# Patient Record
Sex: Female | Born: 1965 | Race: Black or African American | Hispanic: No | Smoking: Former smoker
Health system: Southern US, Community
[De-identification: ages and names within clinical notes are randomized; demographics above are authoritative.]

## PROBLEM LIST (undated history)

## (undated) DIAGNOSIS — E079 Disorder of thyroid, unspecified: Secondary | ICD-10-CM

## (undated) DIAGNOSIS — C539 Malignant neoplasm of cervix uteri, unspecified: Secondary | ICD-10-CM

## (undated) DIAGNOSIS — N289 Disorder of kidney and ureter, unspecified: Secondary | ICD-10-CM

## (undated) DIAGNOSIS — I10 Essential (primary) hypertension: Secondary | ICD-10-CM

## (undated) DIAGNOSIS — C801 Malignant (primary) neoplasm, unspecified: Secondary | ICD-10-CM

## (undated) HISTORY — PX: STENT PLACEMENT RT URETER (ARMC HX): HXRAD1255

## (undated) HISTORY — PX: TUBAL LIGATION: SHX77

---

## 2017-02-19 ENCOUNTER — Encounter (HOSPITAL_COMMUNITY): Payer: Self-pay | Admitting: Emergency Medicine

## 2017-02-19 ENCOUNTER — Emergency Department (HOSPITAL_COMMUNITY): Payer: Medicaid Other

## 2017-02-19 ENCOUNTER — Inpatient Hospital Stay (HOSPITAL_COMMUNITY)
Admission: EM | Admit: 2017-02-19 | Discharge: 2017-02-22 | DRG: 744 | Disposition: A | Discharge status: Other Acute Inpatient Hospital | Payer: Medicaid Other | Attending: Obstetrics and Gynecology | Admitting: Obstetrics and Gynecology

## 2017-02-19 DIAGNOSIS — D649 Anemia, unspecified: Secondary | ICD-10-CM | POA: Diagnosis not present

## 2017-02-19 DIAGNOSIS — N390 Urinary tract infection, site not specified: Secondary | ICD-10-CM | POA: Diagnosis present

## 2017-02-19 DIAGNOSIS — E049 Nontoxic goiter, unspecified: Secondary | ICD-10-CM | POA: Diagnosis not present

## 2017-02-19 DIAGNOSIS — D62 Acute posthemorrhagic anemia: Secondary | ICD-10-CM | POA: Diagnosis not present

## 2017-02-19 DIAGNOSIS — N888 Other specified noninflammatory disorders of cervix uteri: Secondary | ICD-10-CM

## 2017-02-19 DIAGNOSIS — N939 Abnormal uterine and vaginal bleeding, unspecified: Secondary | ICD-10-CM

## 2017-02-19 DIAGNOSIS — C539 Malignant neoplasm of cervix uteri, unspecified: Secondary | ICD-10-CM | POA: Diagnosis not present

## 2017-02-19 DIAGNOSIS — N183 Chronic kidney disease, stage 3 unspecified: Secondary | ICD-10-CM | POA: Diagnosis present

## 2017-02-19 DIAGNOSIS — Z23 Encounter for immunization: Secondary | ICD-10-CM

## 2017-02-19 DIAGNOSIS — R591 Generalized enlarged lymph nodes: Secondary | ICD-10-CM

## 2017-02-19 DIAGNOSIS — N189 Chronic kidney disease, unspecified: Secondary | ICD-10-CM | POA: Diagnosis not present

## 2017-02-19 DIAGNOSIS — Z79899 Other long term (current) drug therapy: Secondary | ICD-10-CM

## 2017-02-19 DIAGNOSIS — N136 Pyonephrosis: Secondary | ICD-10-CM | POA: Diagnosis present

## 2017-02-19 DIAGNOSIS — N1330 Unspecified hydronephrosis: Secondary | ICD-10-CM

## 2017-02-19 DIAGNOSIS — K5900 Constipation, unspecified: Secondary | ICD-10-CM | POA: Diagnosis present

## 2017-02-19 DIAGNOSIS — D630 Anemia in neoplastic disease: Secondary | ICD-10-CM | POA: Diagnosis present

## 2017-02-19 DIAGNOSIS — R221 Localized swelling, mass and lump, neck: Secondary | ICD-10-CM

## 2017-02-19 DIAGNOSIS — I129 Hypertensive chronic kidney disease with stage 1 through stage 4 chronic kidney disease, or unspecified chronic kidney disease: Secondary | ICD-10-CM | POA: Diagnosis present

## 2017-02-19 DIAGNOSIS — Z960 Presence of urogenital implants: Secondary | ICD-10-CM | POA: Diagnosis present

## 2017-02-19 DIAGNOSIS — E042 Nontoxic multinodular goiter: Secondary | ICD-10-CM | POA: Diagnosis present

## 2017-02-19 DIAGNOSIS — N99820 Postprocedural hemorrhage and hematoma of a genitourinary system organ or structure following a genitourinary system procedure: Secondary | ICD-10-CM | POA: Diagnosis present

## 2017-02-19 DIAGNOSIS — I959 Hypotension, unspecified: Secondary | ICD-10-CM | POA: Diagnosis present

## 2017-02-19 DIAGNOSIS — Y848 Other medical procedures as the cause of abnormal reaction of the patient, or of later complication, without mention of misadventure at the time of the procedure: Secondary | ICD-10-CM | POA: Diagnosis present

## 2017-02-19 DIAGNOSIS — E0789 Other specified disorders of thyroid: Secondary | ICD-10-CM | POA: Diagnosis present

## 2017-02-19 DIAGNOSIS — F17210 Nicotine dependence, cigarettes, uncomplicated: Secondary | ICD-10-CM | POA: Diagnosis present

## 2017-02-19 HISTORY — DX: Disorder of thyroid, unspecified: E07.9

## 2017-02-19 HISTORY — DX: Disorder of kidney and ureter, unspecified: N28.9

## 2017-02-19 HISTORY — DX: Essential (primary) hypertension: I10

## 2017-02-19 LAB — COMPREHENSIVE METABOLIC PANEL
ALBUMIN: 3.5 g/dL (ref 3.5–5.0)
ALK PHOS: 66 U/L (ref 38–126)
ALT: 8 U/L — AB (ref 14–54)
AST: 17 U/L (ref 15–41)
Anion gap: 8 (ref 5–15)
BUN: 15 mg/dL (ref 6–20)
CALCIUM: 9.5 mg/dL (ref 8.9–10.3)
CHLORIDE: 102 mmol/L (ref 101–111)
CO2: 25 mmol/L (ref 22–32)
CREATININE: 1.8 mg/dL — AB (ref 0.44–1.00)
GFR calc non Af Amer: 31 mL/min — ABNORMAL LOW (ref >60)
GFR, EST AFRICAN AMERICAN: 36 mL/min — AB (ref >60)
GLUCOSE: 94 mg/dL (ref 65–99)
Potassium: 4.3 mmol/L (ref 3.5–5.1)
SODIUM: 135 mmol/L (ref 135–145)
Total Bilirubin: 0.1 mg/dL — ABNORMAL LOW (ref 0.3–1.2)
Total Protein: 7.4 g/dL (ref 6.5–8.1)

## 2017-02-19 LAB — CBC WITH DIFFERENTIAL/PLATELET
Basophils Absolute: 0 10*3/uL (ref 0.0–0.1)
Basophils Relative: 0 %
EOS ABS: 0.4 10*3/uL (ref 0.0–0.7)
EOS PCT: 6 %
HCT: 27.3 % — ABNORMAL LOW (ref 36.0–46.0)
Hemoglobin: 8.6 g/dL — ABNORMAL LOW (ref 12.0–15.0)
LYMPHS PCT: 26 %
Lymphs Abs: 1.9 10*3/uL (ref 0.7–4.0)
MCH: 28.5 pg (ref 26.0–34.0)
MCHC: 31.5 g/dL (ref 30.0–36.0)
MCV: 90.4 fL (ref 78.0–100.0)
MONOS PCT: 8 %
Monocytes Absolute: 0.6 10*3/uL (ref 0.1–1.0)
Neutro Abs: 4.4 10*3/uL (ref 1.7–7.7)
Neutrophils Relative %: 60 %
PLATELETS: 433 10*3/uL — AB (ref 150–400)
RBC: 3.02 MIL/uL — ABNORMAL LOW (ref 3.87–5.11)
RDW: 14.4 % (ref 11.5–15.5)
WBC: 7.3 10*3/uL (ref 4.0–10.5)

## 2017-02-19 LAB — URINALYSIS, ROUTINE W REFLEX MICROSCOPIC
BILIRUBIN URINE: NEGATIVE
Glucose, UA: NEGATIVE mg/dL
KETONES UR: NEGATIVE mg/dL
Nitrite: NEGATIVE
Protein, ur: 30 mg/dL — AB
SPECIFIC GRAVITY, URINE: 1.01 (ref 1.005–1.030)
pH: 7 (ref 5.0–8.0)

## 2017-02-19 LAB — POC URINE PREG, ED: PREG TEST UR: NEGATIVE

## 2017-02-19 LAB — LIPASE, BLOOD: Lipase: 49 U/L (ref 11–51)

## 2017-02-19 MED ORDER — MORPHINE SULFATE (PF) 4 MG/ML IV SOLN
4.0000 mg | Freq: Once | INTRAVENOUS | Status: AC
  Administered 2017-02-19: 4 mg via INTRAVENOUS
  Filled 2017-02-19: qty 1

## 2017-02-19 MED ORDER — METOCLOPRAMIDE HCL 5 MG/ML IJ SOLN: 10.0000 mg | Freq: Four times a day (QID) | INTRAMUSCULAR | Status: DC | PRN

## 2017-02-19 MED ORDER — PRENATAL MULTIVITAMIN CH: 1.0000 | ORAL_TABLET | Freq: Every day | ORAL | Status: DC

## 2017-02-19 MED ORDER — SODIUM CHLORIDE 0.9 % IV SOLN
INTRAVENOUS | Status: DC
  Administered 2017-02-19 – 2017-02-20 (×4): via INTRAVENOUS

## 2017-02-19 MED ORDER — INFLUENZA VAC SPLIT QUAD 0.5 ML IM SUSY
0.5000 mL | PREFILLED_SYRINGE | INTRAMUSCULAR | Status: AC
  Administered 2017-02-20: 0.5 mL via INTRAMUSCULAR
  Filled 2017-02-19: qty 0.5

## 2017-02-19 MED ORDER — ENSURE ENLIVE PO LIQD
237.0000 mL | Freq: Two times a day (BID) | ORAL | Status: DC
  Administered 2017-02-20 (×2): 237 mL via ORAL

## 2017-02-19 MED ORDER — DOCUSATE SODIUM 100 MG PO CAPS
100.0000 mg | ORAL_CAPSULE | Freq: Two times a day (BID) | ORAL | Status: DC
  Administered 2017-02-19 – 2017-02-22 (×6): 100 mg via ORAL
  Filled 2017-02-19 (×6): qty 1

## 2017-02-19 MED ORDER — CARVEDILOL 12.5 MG PO TABS
12.5000 mg | ORAL_TABLET | Freq: Two times a day (BID) | ORAL | Status: DC
  Administered 2017-02-20: 12.5 mg via ORAL
  Filled 2017-02-19: qty 1

## 2017-02-19 MED ORDER — IBUPROFEN 600 MG PO TABS: 600.0000 mg | ORAL_TABLET | Freq: Four times a day (QID) | ORAL | Status: DC | PRN

## 2017-02-19 MED ORDER — PNEUMOCOCCAL VAC POLYVALENT 25 MCG/0.5ML IJ INJ
0.5000 mL | INJECTION | INTRAMUSCULAR | Status: AC
  Administered 2017-02-20: 0.5 mL via INTRAMUSCULAR
  Filled 2017-02-19: qty 0.5

## 2017-02-19 MED ORDER — AMLODIPINE BESYLATE 5 MG PO TABS
10.0000 mg | ORAL_TABLET | Freq: Every day | ORAL | Status: DC
  Filled 2017-02-19 (×2): qty 2

## 2017-02-19 MED ORDER — SILVER NITRATE-POT NITRATE 75-25 % EX MISC
CUTANEOUS | Status: AC
  Filled 2017-02-19: qty 1

## 2017-02-19 MED ORDER — CEPHALEXIN 500 MG PO CAPS
500.0000 mg | ORAL_CAPSULE | Freq: Once | ORAL | Status: AC
  Administered 2017-02-19: 500 mg via ORAL
  Filled 2017-02-19: qty 1

## 2017-02-19 MED ORDER — HYDROCODONE-ACETAMINOPHEN 5-325 MG PO TABS
1.0000 | ORAL_TABLET | ORAL | Status: DC | PRN
  Administered 2017-02-19 – 2017-02-22 (×12): 2 via ORAL
  Filled 2017-02-19 (×12): qty 2

## 2017-02-19 MED ORDER — FERROUS SULFATE 325 (65 FE) MG PO TABS
975.0000 mg | ORAL_TABLET | Freq: Three times a day (TID) | ORAL | Status: DC
  Administered 2017-02-19 – 2017-02-22 (×9): 975 mg via ORAL
  Filled 2017-02-19 (×9): qty 3

## 2017-02-19 NOTE — ED Notes (Signed)
Consent signed.

## 2017-02-19 NOTE — ED Notes (Signed)
Dr. Glo Herring at bedside for cervical biopsy.

## 2017-02-19 NOTE — H&P (Signed)
Tanya Wright is an 51 y.o. female. She is placed in observation, to monitor for vaginal bleeding after cervical biopsies for a probable advanced cervical cancer. She has been bleeding for at least 'a few months" probably longer. She has had ureteral stents placed several months ago , august, at [facility], by Urology but reports that she was not seen by a gyn, and has not been able to arrange an appt locally. She is from [place], and lives alone, near her mother.  Pertinent Gynecological History: Menses: post-menopausal Bleeding: post menopausal bleeding Contraception: none DES exposure: unknown Blood transfusions: none Sexually transmitted diseases: no past history Previous GYN Procedures:   Last mammogram: normal Date: unknown pt reports getting regular exams. Last pap:  Date:  OB History: G, P   Menstrual History: Menarche age:  No LMP recorded. Patient is not currently having periods (Reason: Other).    Past Medical History:  Diagnosis Date  . Hypertension   . Renal disorder   . Thyroid disease     Past Surgical History:  Procedure Laterality Date  . STENT PLACEMENT RT URETER ([place] HX)     sent in both kidneys for stones  . TUBAL LIGATION      History reviewed. No pertinent family history.  Social History:  reports that she has been smoking Cigarettes.  She has never used smokeless tobacco. She reports that she drinks alcohol. She reports that she uses drugs, including Marijuana.  Allergies: No Known Allergies   (Not in a hospital admission)  ROS has seen providers at [facility] for Paps, htn, and gone to [place] where stents were placed.  Blood pressure 113/85, pulse 91, temperature 98.5 F (36.9 C), temperature source Oral, resp. rate 17, height 5\' 11"  (1.803 m), weight 145 lb (65.8 kg), SpO2 100 %. Physical Exam  Constitutional: She is oriented to person, place, and time. She appears well-developed and well-nourished.  Well  groomed, tearful afraid  HENT:  Head: Normocephalic.  Neck: No tracheal deviation present. Thyromegaly present.  Huge asssymmetric thyroid mass, mostly on left , 8-10 cm diameter, displaces left carotid.laterally and posteriorly Larynx intace    GI: Soft.  Genitourinary: Vagina normal.  Genitourinary Comments: Huge cervical mass originating fro internal cervix. Biopsied x 3. Bimanual : pelvis fixed with nodularity in adnexa  Musculoskeletal: She exhibits edema and deformity.  Neurological: She is alert and oriented to person, place, and time. She has normal reflexes.    Results for orders placed or performed during the hospital encounter of 02/19/17 (from the past 24 hour(s))  Urinalysis, Routine w reflex microscopic     Status: Abnormal   Collection Time: 02/19/17 10:01 AM  Result Value Ref Range   Color, Urine STRAW (A) YELLOW   APPearance HAZY (A) CLEAR   Specific Gravity, Urine 1.010 1.005 - 1.030   pH 7.0 5.0 - 8.0   Glucose, UA NEGATIVE NEGATIVE mg/dL   Hgb urine dipstick MODERATE (A) NEGATIVE   Bilirubin Urine NEGATIVE NEGATIVE   Ketones, ur NEGATIVE NEGATIVE mg/dL   Protein, ur 30 (A) NEGATIVE mg/dL   Nitrite NEGATIVE NEGATIVE   Leukocytes, UA LARGE (A) NEGATIVE   RBC / HPF 6-30 0 - 5 RBC/hpf   WBC, UA TOO NUMEROUS TO COUNT 0 - 5 WBC/hpf   Bacteria, UA RARE (A) NONE SEEN   Squamous Epithelial / LPF 6-30 (A) NONE SEEN   Mucus PRESENT    Hyaline Casts, UA PRESENT   POC urine preg, ED  Status: None   Collection Time: 02/19/17 10:13 AM  Result Value Ref Range   Preg Test, Ur NEGATIVE NEGATIVE  Comprehensive metabolic panel     Status: Abnormal   Collection Time: 02/19/17 10:27 AM  Result Value Ref Range   Sodium 135 135 - 145 mmol/L   Potassium 4.3 3.5 - 5.1 mmol/L   Chloride 102 101 - 111 mmol/L   CO2 25 22 - 32 mmol/L   Glucose, Bld 94 65 - 99 mg/dL   BUN 15 6 - 20 mg/dL   Creatinine, Ser 1.80 (H) 0.44 - 1.00 mg/dL   Calcium 9.5 8.9 - 10.3 mg/dL   Total  Protein 7.4 6.5 - 8.1 g/dL   Albumin 3.5 3.5 - 5.0 g/dL   AST 17 15 - 41 U/L   ALT 8 (L) 14 - 54 U/L   Alkaline Phosphatase 66 38 - 126 U/L   Total Bilirubin 0.1 (L) 0.3 - 1.2 mg/dL   GFR calc non Af Amer 31 (L) >60 mL/min   GFR calc Af Amer 36 (L) >60 mL/min   Anion gap 8 5 - 15  Lipase, blood     Status: None   Collection Time: 02/19/17 10:27 AM  Result Value Ref Range   Lipase 49 11 - 51 U/L  CBC with Diff     Status: Abnormal   Collection Time: 02/19/17 10:27 AM  Result Value Ref Range   WBC 7.3 4.0 - 10.5 K/uL   RBC 3.02 (L) 3.87 - 5.11 MIL/uL   Hemoglobin 8.6 (L) 12.0 - 15.0 g/dL   HCT 27.3 (L) 36.0 - 46.0 %   MCV 90.4 78.0 - 100.0 fL   MCH 28.5 26.0 - 34.0 pg   MCHC 31.5 30.0 - 36.0 g/dL   RDW 14.4 11.5 - 15.5 %   Platelets 433 (H) 150 - 400 K/uL   Neutrophils Relative % 60 %   Neutro Abs 4.4 1.7 - 7.7 K/uL   Lymphocytes Relative 26 %   Lymphs Abs 1.9 0.7 - 4.0 K/uL   Monocytes Relative 8 %   Monocytes Absolute 0.6 0.1 - 1.0 K/uL   Eosinophils Relative 6 %   Eosinophils Absolute 0.4 0.0 - 0.7 K/uL   Basophils Relative 0 %   Basophils Absolute 0.0 0.0 - 0.1 K/uL    Ct Renal Stone Study  Result Date: 02/19/2017 CLINICAL DATA:  Bilateral flank pain, nausea/vomiting, ureteral stents, history of kidney stones EXAM: CT ABDOMEN AND PELVIS WITHOUT CONTRAST TECHNIQUE: Multidetector CT imaging of the abdomen and pelvis was performed following the standard protocol without IV contrast. COMPARISON:  None. FINDINGS: Lower chest: Lung bases are clear. Hepatobiliary: Unenhanced liver is unremarkable. Gallbladder is unremarkable. No intrahepatic for extrahepatic duct dilatation. Pancreas: Within normal limits. Spleen: Within normal limits. Adrenals/Urinary Tract: Adrenal glands within normal limits. Kidneys are notable for moderate hydroureteronephrosis with indwelling bilateral ureteral stents. No renal, ureteral, or bladder calculi. 2.6 cm posterior urethral diverticulum (series  2/image 78). Bladder is within normal limits. Stomach/Bowel: Stomach is within normal limits. No evidence of bowel obstruction. Normal appendix (series 2/ image 64). Vascular/Lymphatic: No evidence of abdominal aortic aneurysm. Atherosclerotic calcifications of the abdominal aorta and branch vessels. Extensive abdominopelvic lymphadenopathy, much of which is partially calcified, including: --1.8 cm short axis peripancreatic node (series 2/ image 28) --2.8 cm short axis left para- aortic node (series 2/ image 39) --2.6 cm short axis right common iliac node (series 2/ image 46) --1.9 cm short axis node anterior to the  L5 vertebral body (series 2/ image 52) --1.8 cm short axis right external iliac node (series 2/image 66) --1.9 cm short axis right external iliac node (series 2/image 67) --1.5 cm short axis left deep inguinal node (series 2/image 67) --1.3 cm short axis left perirectal node (series 2/image 75) Reproductive: 4.5 x 6.1 x 5.0 cm mass centered in the region of the cervix (series 2/image 69; sagittal image 55). Parametrial invasion is suspected, although poorly evaluated on CT. Associated obstructive uropathy, described above. Direct extension into the anterior rectal wall is difficult to exclude (series 2/image 70). Associated fluid trapped within the endometrial cavity (sagittal image 53). Other: No abdominopelvic ascites. Musculoskeletal: Degenerative changes of the visualized thoracolumbar spine. IMPRESSION: 6.1 cm cervical mass, poorly evaluated on unenhanced CT, suspicious for cervical cancer. Associated moderate bilateral hydroureteronephrosis with indwelling bilateral ureteral stents. No renal or ureteral calculi. Direct invasion into the anterior rectum is not excluded. If present, this would make the FIGO staging at least stage IVA. Extensive abdominopelvic lymphadenopathy, many of which are partially calcified. In this clinical setting, nodal metastases are presumed. The presence of suspected  nodal metastases outside the true pelvis would reflect stage IVB disease. Electronically Signed   By: Julian Hy M.D.   On: 02/19/2017 13:05    Assessment/Plan:  1. Cervical mass, biopsied suspect cervical cancer 2. Ureteral obstruction , releieved by ureteral stents. 3  Neck mass                                                                                                                                              4 anemia.  Plan: admit for overnight obs and toget pt to [facility] once dx clarified. Azelia Reiger V 02/19/2017, 3:36 PM

## 2017-02-19 NOTE — ED Provider Notes (Signed)
[facility] EMERGENCY DEPARTMENT Provider Note   CSN: [id number] Arrival date & time: 02/19/17  0626     History   Chief Complaint Chief Complaint  Patient presents with  . Flank Pain    HPI Tanya Wright is a 51 y.o. female.  Pt has history of renal failure, and renal stents.  She state she was admitted to the hospital at [facility].  She was eventually released.  Pt has been seeing different doctors since that time. She has been having pain in the left flank and suprapubic region for several weeks. She was told to follow up with an OB GYN doctor.  She has not done that yet because of insurance reasons.  She is still having pain.  Her renal doctor has been trying to get her in with a GYN doctor but is having issues because of her lack of insurance.  Pt is frustrated and tearful.  She is afraid people are going to let her die because she does not have insurance.     The history is provided by the patient.  Flank Pain  This is a recurrent problem. The problem has been rapidly worsening. Associated symptoms include abdominal pain. Nothing aggravates the symptoms. Nothing relieves the symptoms.    Past Medical History:  Diagnosis Date  . Hypertension   . Renal disorder   . Thyroid disease     There are no active problems to display for this patient.   Past Surgical History:  Procedure Laterality Date  . STENT PLACEMENT RT URETER ([place] HX)     sent in both kidneys for stones  . TUBAL LIGATION      OB History    No data available       Home Medications    Prior to Admission medications   Medication Sig Start Date End Date Taking? Authorizing Provider  acetaminophen (TYLENOL) 500 MG tablet Take 1,000 mg by mouth every 6 (six) hours as needed for mild pain, moderate pain or headache.   Yes [provider]  amLODipine (NORVASC) 10 MG tablet Take 10 mg by mouth at bedtime.   Yes [provider]  calcium acetate (PHOSLO) 667 MG capsule Take 667 mg  by mouth 3 (three) times daily with meals.   Yes [provider]  carvedilol (COREG) 12.5 MG tablet Take 12.5 mg by mouth 2 (two) times daily.   Yes [provider]  ferrous sulfate 325 (65 FE) MG tablet Take 975 mg by mouth 3 (three) times daily.   Yes [provider]    Family History History reviewed. No pertinent family history.  Social History Social History  Substance Use Topics  . Smoking status: Current Some Day Smoker    Types: Cigarettes  . Smokeless tobacco: Never Used  . Alcohol use Yes     Allergies   Patient has no known allergies.   Review of Systems Review of Systems  Gastrointestinal: Positive for abdominal pain.  Genitourinary: Positive for flank pain.  All other systems reviewed and are negative.    Physical Exam Updated Vital Signs BP 111/82 (BP Location: Left Arm)   Pulse 90   Temp 98.5 F (36.9 C) (Oral)   Resp 19   Ht 1.803 m (5\' 11" )   Wt 65.8 kg (145 lb)   SpO2 99%   BMI 20.22 kg/m   Physical Exam  Constitutional: She appears well-developed and well-nourished. No distress.  HENT:  Head: Normocephalic and atraumatic.  Right Ear: External ear  normal.  Left Ear: External ear normal.  Eyes: Conjunctivae are normal. Right eye exhibits no discharge. Left eye exhibits no discharge. No scleral icterus.  Neck: Neck supple. No tracheal deviation present. Thyromegaly present.  assymetric left side  Cardiovascular: Normal rate, regular rhythm and intact distal pulses.   Pulmonary/Chest: Effort normal and breath sounds normal. No stridor. No respiratory distress. She has no wheezes. She has no rales.  Abdominal: Soft. Bowel sounds are normal. She exhibits no distension. There is no tenderness. There is CVA tenderness (left). There is no rebound and no guarding.  Musculoskeletal: She exhibits no edema or tenderness.  Neurological: She is alert. She has normal strength. No cranial nerve deficit (no facial droop, extraocular  movements intact, no slurred speech) or sensory deficit. She exhibits normal muscle tone. She displays no seizure activity. Coordination normal.  Skin: Skin is warm and dry. No rash noted.  Psychiatric: She has a normal mood and affect.  Nursing note and vitals reviewed.    ED Treatments / Results  Labs (all labs ordered are listed, but only abnormal results are displayed) Labs Reviewed  COMPREHENSIVE METABOLIC PANEL - Abnormal; Notable for the following:       Result Value   Creatinine, Ser 1.80 (*)    ALT 8 (*)    Total Bilirubin 0.1 (*)    GFR calc non Af Amer 31 (*)    GFR calc Af Amer 36 (*)    All other components within normal limits  CBC WITH DIFFERENTIAL/PLATELET - Abnormal; Notable for the following:    RBC 3.02 (*)    Hemoglobin 8.6 (*)    HCT 27.3 (*)    Platelets 433 (*)    All other components within normal limits  URINALYSIS, ROUTINE W REFLEX MICROSCOPIC - Abnormal; Notable for the following:    Color, Urine STRAW (*)    APPearance HAZY (*)    Hgb urine dipstick MODERATE (*)    Protein, ur 30 (*)    Leukocytes, UA LARGE (*)    Bacteria, UA RARE (*)    Squamous Epithelial / LPF 6-30 (*)    All other components within normal limits  LIPASE, BLOOD  POC URINE PREG, ED    EKG  EKG Interpretation None       Radiology Ct Renal Stone Study  Result Date: 02/19/2017 CLINICAL DATA:  Bilateral flank pain, nausea/vomiting, ureteral stents, history of kidney stones EXAM: CT ABDOMEN AND PELVIS WITHOUT CONTRAST TECHNIQUE: Multidetector CT imaging of the abdomen and pelvis was performed following the standard protocol without IV contrast. COMPARISON:  None. FINDINGS: Lower chest: Lung bases are clear. Hepatobiliary: Unenhanced liver is unremarkable. Gallbladder is unremarkable. No intrahepatic for extrahepatic duct dilatation. Pancreas: Within normal limits. Spleen: Within normal limits. Adrenals/Urinary Tract: Adrenal glands within normal limits. Kidneys are notable  for moderate hydroureteronephrosis with indwelling bilateral ureteral stents. No renal, ureteral, or bladder calculi. 2.6 cm posterior urethral diverticulum (series 2/image 78). Bladder is within normal limits. Stomach/Bowel: Stomach is within normal limits. No evidence of bowel obstruction. Normal appendix (series 2/ image 64). Vascular/Lymphatic: No evidence of abdominal aortic aneurysm. Atherosclerotic calcifications of the abdominal aorta and branch vessels. Extensive abdominopelvic lymphadenopathy, much of which is partially calcified, including: --1.8 cm short axis peripancreatic node (series 2/ image 28) --2.8 cm short axis left para- aortic node (series 2/ image 39) --2.6 cm short axis right common iliac node (series 2/ image 46) --1.9 cm short axis node anterior to the L5 vertebral body (series 2/  image 52) --1.8 cm short axis right external iliac node (series 2/image 66) --1.9 cm short axis right external iliac node (series 2/image 67) --1.5 cm short axis left deep inguinal node (series 2/image 67) --1.3 cm short axis left perirectal node (series 2/image 75) Reproductive: 4.5 x 6.1 x 5.0 cm mass centered in the region of the cervix (series 2/image 69; sagittal image 55). Parametrial invasion is suspected, although poorly evaluated on CT. Associated obstructive uropathy, described above. Direct extension into the anterior rectal wall is difficult to exclude (series 2/image 70). Associated fluid trapped within the endometrial cavity (sagittal image 53). Other: No abdominopelvic ascites. Musculoskeletal: Degenerative changes of the visualized thoracolumbar spine. IMPRESSION: 6.1 cm cervical mass, poorly evaluated on unenhanced CT, suspicious for cervical cancer. Associated moderate bilateral hydroureteronephrosis with indwelling bilateral ureteral stents. No renal or ureteral calculi. Direct invasion into the anterior rectum is not excluded. If present, this would make the FIGO staging at least stage IVA.  Extensive abdominopelvic lymphadenopathy, many of which are partially calcified. In this clinical setting, nodal metastases are presumed. The presence of suspected nodal metastases outside the true pelvis would reflect stage IVB disease. Electronically Signed   By: Julian Hy M.D.   On: 02/19/2017 13:05    Procedures Procedures (including critical care time)  Medications Ordered in ED Medications  0.9 %  sodium chloride infusion ( Intravenous New Bag/Given 02/19/17 1025)  morphine 4 MG/ML injection 4 mg (4 mg Intravenous Given 02/19/17 1026)  cephALEXin (KEFLEX) capsule 500 mg (500 mg Oral Given 02/19/17 1108)     Initial Impression / Assessment and Plan / ED Course  I have reviewed the triage vital signs and the nursing notes.  Pertinent labs & imaging results that were available during my care of the patient were reviewed by me and considered in my medical decision making (see chart for details).  Clinical Course as of Feb 19 1450  Mon Feb 19, 2017  0940 30 hydrocodone on 10/4  [JK]  1057 Anemia noted.  No prior labs for comparison.  Doubt acute loss .  No complains of blood in her stool.  UA suggests UTI.  Wih the flank pain will ct to rule out renal stone  [JK]  1414 Discussed CT findings with patient.  Discussed case with Dr Glo Herring.  He will come  and see patient in the ED.  Appreciate his assistance  [JK]    Clinical Course User Index [JK] Dorie Rank, MD   Patient's laboratory tests are notable for a probable urinary tract infection.  Patient does have a mild anemia.  Patient states she has been anemic for a while.  I suspect this is chronic.  She does have a slightly elevated creatinine but no signs of acute renal failure.  CT scan does demonstrate a mass consistent with a cervical cancer.  She also does have abnormal lymph nodes.  Patient states she has not seen a gynecologist yet.  I will consult with OB/GYN.  Dr Glo Herring saw the patient in the ED.  He did a biopsy and  will plan on admitting for observation as she is likely to have some bleeding after the procedure  Final Clinical Impressions(s) / ED Diagnoses   Final diagnoses:  Goiter  Cervical mass  Chronic renal impairment, unspecified CKD stage  Hydronephrosis, unspecified hydronephrosis type      Dorie Rank, MD 02/19/17 1559

## 2017-02-19 NOTE — ED Triage Notes (Signed)
Pt states has history of kidney stones. Has stents on both sides. N/v with 3 episodes in 24 hours. Complaining of bilateral pain in flank area.

## 2017-02-20 ENCOUNTER — Observation Stay (HOSPITAL_COMMUNITY): Payer: Medicaid Other

## 2017-02-20 DIAGNOSIS — N939 Abnormal uterine and vaginal bleeding, unspecified: Secondary | ICD-10-CM | POA: Diagnosis not present

## 2017-02-20 DIAGNOSIS — N189 Chronic kidney disease, unspecified: Secondary | ICD-10-CM | POA: Diagnosis present

## 2017-02-20 DIAGNOSIS — C539 Malignant neoplasm of cervix uteri, unspecified: Secondary | ICD-10-CM | POA: Diagnosis not present

## 2017-02-20 DIAGNOSIS — N183 Chronic kidney disease, stage 3 unspecified: Secondary | ICD-10-CM | POA: Diagnosis present

## 2017-02-20 DIAGNOSIS — N390 Urinary tract infection, site not specified: Secondary | ICD-10-CM | POA: Diagnosis present

## 2017-02-20 DIAGNOSIS — E049 Nontoxic goiter, unspecified: Secondary | ICD-10-CM | POA: Diagnosis not present

## 2017-02-20 DIAGNOSIS — D649 Anemia, unspecified: Secondary | ICD-10-CM | POA: Diagnosis not present

## 2017-02-20 LAB — BASIC METABOLIC PANEL
Anion gap: 7 (ref 5–15)
BUN: 17 mg/dL (ref 6–20)
CHLORIDE: 107 mmol/L (ref 101–111)
CO2: 23 mmol/L (ref 22–32)
Calcium: 8.8 mg/dL — ABNORMAL LOW (ref 8.9–10.3)
Creatinine, Ser: 2.02 mg/dL — ABNORMAL HIGH (ref 0.44–1.00)
GFR calc Af Amer: 32 mL/min — ABNORMAL LOW (ref >60)
GFR calc non Af Amer: 27 mL/min — ABNORMAL LOW (ref >60)
GLUCOSE: 131 mg/dL — AB (ref 65–99)
POTASSIUM: 3.9 mmol/L (ref 3.5–5.1)
Sodium: 137 mmol/L (ref 135–145)

## 2017-02-20 LAB — CBC WITH DIFFERENTIAL/PLATELET
Basophils Absolute: 0 10*3/uL (ref 0.0–0.1)
Basophils Relative: 0 %
Eosinophils Absolute: 0.4 10*3/uL (ref 0.0–0.7)
Eosinophils Relative: 7 %
HEMATOCRIT: 23.4 % — AB (ref 36.0–46.0)
HEMOGLOBIN: 7.6 g/dL — AB (ref 12.0–15.0)
LYMPHS ABS: 1.9 10*3/uL (ref 0.7–4.0)
Lymphocytes Relative: 31 %
MCH: 28.5 pg (ref 26.0–34.0)
MCHC: 32.5 g/dL (ref 30.0–36.0)
MCV: 87.6 fL (ref 78.0–100.0)
MONOS PCT: 6 %
Monocytes Absolute: 0.4 10*3/uL (ref 0.1–1.0)
NEUTROS ABS: 3.5 10*3/uL (ref 1.7–7.7)
NEUTROS PCT: 56 %
Platelets: 384 10*3/uL (ref 150–400)
RBC: 2.67 MIL/uL — ABNORMAL LOW (ref 3.87–5.11)
RDW: 14.4 % (ref 11.5–15.5)
WBC: 6.3 10*3/uL (ref 4.0–10.5)

## 2017-02-20 LAB — PREPARE RBC (CROSSMATCH)

## 2017-02-20 LAB — TSH: TSH: 0.524 u[IU]/mL (ref 0.350–4.500)

## 2017-02-20 MED ORDER — SODIUM CHLORIDE 0.9 % IV BOLUS (SEPSIS)
500.0000 mL | Freq: Once | INTRAVENOUS | Status: AC
  Administered 2017-02-20: 500 mL via INTRAVENOUS

## 2017-02-20 MED ORDER — FLEET ENEMA 7-19 GM/118ML RE ENEM
1.0000 | ENEMA | Freq: Every day | RECTAL | Status: DC | PRN
  Administered 2017-02-21: 1 via RECTAL
  Filled 2017-02-20: qty 1

## 2017-02-20 MED ORDER — ENSURE ENLIVE PO LIQD
237.0000 mL | Freq: Three times a day (TID) | ORAL | Status: DC
  Administered 2017-02-20 – 2017-02-22 (×4): 237 mL via ORAL

## 2017-02-20 MED ORDER — SODIUM CHLORIDE 0.9 % IV SOLN: Freq: Once | INTRAVENOUS | Status: DC

## 2017-02-20 MED ORDER — ACETAMINOPHEN 325 MG PO TABS: 650.0000 mg | ORAL_TABLET | Freq: Four times a day (QID) | ORAL | Status: DC | PRN

## 2017-02-20 MED ORDER — POLYETHYLENE GLYCOL 3350 17 G PO PACK
17.0000 g | PACK | Freq: Every day | ORAL | Status: DC
  Administered 2017-02-20 – 2017-02-22 (×3): 17 g via ORAL
  Filled 2017-02-20 (×3): qty 1

## 2017-02-20 MED ORDER — DEXTROSE 5 % IV SOLN
1.0000 g | INTRAVENOUS | Status: DC
  Administered 2017-02-20 – 2017-02-21 (×2): 1 g via INTRAVENOUS
  Filled 2017-02-20 (×4): qty 10

## 2017-02-20 NOTE — Progress Notes (Addendum)
Initial Nutrition Assessment  DOCUMENTATION CODES:  Not applicable  INTERVENTION:  Increase Ensure Enlive po to TID, each supplement provides 350 kcal and 20 grams of protein  Meal/food requests noted, passed to dietary  Gave brief diet education for high protein, kcal intake.   Patient reports no BM x 5 days. Noted on iron, prn opiate medications. Pt felt she needed assistance with more aggressive bowel regimen->conferred to MD  NUTRITION DIAGNOSIS:  Increased nutrient needs related to cancer associated hypermetabolism as evidenced by estimated nutritional requirements for this condition and patients report of loss of ~25 lbs in a "few months"  GOAL:  Patient will meet greater than or equal to 90% of their needs  MONITOR:  PO intake, Supplement acceptance, Labs, Weight trends, I & O's  REASON FOR ASSESSMENT:  Malnutrition Screening Tool    ASSESSMENT:  51 y/o female PMHx HTN, Kidney stones s/p bilateral ureteral stents, thyroid disease. Presented with 3 episodes of vomiting x 24 hrs, bilateral flank pain, and report of vaginal bleeding for months. Imaging revealed mass consistent w/ cervical cancer as well as extensive abdominopelvic lymphadenopathy. Admitted for observation for potential bleeding s/p biopsy of mass.   Patient seen lying in bed with lights off, slightly tearful. She reports she has had a decrease in Intake for a "few months". She says this is due to a much heightened increase in smell sensitivity. She becomes nauseated when she smells certain food. Additionally, reports early satiety, constipation and pain. Despite this, she maintains her appetite has been pretty good. She will try to eat small amounts frequently. She has been consuming Ensure, approximately 3-4/day. Vitamin/mineral wise, she takes an iron supplement due to her anemia.   There is no past weight history on patient. She endorses weight loss. She says her UBW is approximately 170 lbs and she has lost  all this weight in the past few months. She does state that she has "leveled off", indicating her caloric intake is currently meeting her expenditure.   At this time, she says she has not had a BM in ~5 days. She says drinks fluids liberally at home, mostly water. Milk gives her diarrhea. She says she feels like she will need a softener/laxative. Will communicate this to MD  RD gave some brief diet education. Assuming she pursues treatment, she will have much increased caloric/protein needs no matter what treatment is chosen. RD discussed importance of protein/calories in cancer treatment. Recommended she switch from 0kcal beverages to items such as her ensure, low sugar sportsdrinks, juice etc. She should prioritize protein. RD offered comfort foods for tonight.   NFPE: Deferred. Not appropriate at this time.   LaBS: BUN/creat:2.02/8.8 (increased), H/H:7.6/23.4 (decrease) Meds: Ensure Enlive, Iron, colace, prn pain medications   Recent Labs Lab 02/19/17 1027 02/20/17 1240  NA 135 137  K 4.3 3.9  CL 102 107  CO2 25 23  BUN 15 17  CREATININE 1.80* 2.02*  CALCIUM 9.5 8.8*  GLUCOSE 94 131*   NUTRITION - FOCUSED PHYSICAL EXAM: Deferred  Diet Order:  Diet regular Room service appropriate? Yes; Fluid consistency: Thin  EDUCATION NEEDS:  Education needs have been addressed  Skin:  Skin Assessment: Reviewed RN Assessment  Last BM:  Per nursing 10/27. Per pt:10/25  Height:  Ht Readings from Last 1 Encounters:  02/19/17 5\' 11"  (1.803 m)   Weight:  Wt Readings from Last 1 Encounters:  02/19/17 143 lb 8.3 oz (65.1 kg)   Ideal Body Weight:  70.45 kg  BMI:  Body mass index is 20.02 kg/m.  Estimated Nutritional Needs:  Kcal:  2100-2300 (32-35 kcal/kg bw) Protein:  90-105 Pro (1.4-1.6g /kg bw) Fluid:  >2.3 L (35 ml/kg bw)  Burtis Junes RD, LDN, CNSC Clinical Nutrition Pager: [phone] 02/20/2017 3:58 PM

## 2017-02-20 NOTE — Progress Notes (Signed)
Subjective: HOSP day 1 after admission to monitor bleeding after cervical biopsy in ED on a suspected cervical cancer.  Biopsy bleeding was significant , 20 + cc and pt has unreliable support.  Since admit she has had minimal bleeding. She had an episode of hypotension , BP dropping after this morning's Antihypertensive to 80's / 50's.  She was given a fluid bolus and has been alert throughout.    Patient reports no BM in 5 days. This happens when she is on iron tablets as she has been recently   PMH: pt reports the thyroid mass has been evaluated "a couple of years ago" at [facility] with pt "put in to a xray machine. She has not sought further workup.   In August she was seen at [facility], received Stents and alleges she was told to seek ObGyn care , which has not happened, pt alleging that no on e would see her without insurance.    Objective: I have reviewed patient's vital signs, medications and labs. CBC Latest Ref Rng & Units 02/20/2017 02/19/2017  WBC 4.0 - 10.5 K/uL 6.3 7.3  Hemoglobin 12.0 - 15.0 g/dL 7.6(L) 8.6(L)  Hematocrit 36.0 - 46.0 % 23.4(L) 27.3(L)  Platelets 150 - 400 K/uL 384 433(H)    General: alert and no distress Resp: unlabored breathing GI: normal findings: soft, non-tender Vaginal Bleeding: minimal   Assessment/Plan: 1 probable cervical cancer, awaiting cervix biopsy results tomorrow 2. Anemia , suspect chronic due to cancer and vaginal bleeding losses.  3 Thyroid Mass: longstanding needs reassessment 4  Constipation. Will Rx Miralax / continue IRON> 5 Hypotension. Holding BP meds for now.   I have asked Hospitalists to assist in management.  I anticipate receiving a call tomorrow on the Cervix biopsied, that should confirm cancer, and have left messages with Dr Denman George, Gyn Oncology at [facility], to ask her assistance in arranging followup in [facility], which is closer to her home of record than [place] is. The pt has transportation difficulties, her  mother supplies transportation, day light hours only, and the pt does not drive or have access to a car other than her mother.     LOS: 1 day    Saleen Peden V 02/20/2017, 3:38 PM

## 2017-02-20 NOTE — Care Management Note (Signed)
Case Management Note  Patient Details  Name: Tanya Wright MRN: [id number] Date of Birth: [birth year]  Subjective/Objective:          Admitted obs with excessive vaginal bleeding after biopsy. Pt from home, lives alone. Has mother as support person. Pt unemployed, uninsured. She does not drive (mother provides transportation). Pt has [facility] listed as her PCP. They report she was a no-show for her August appointment and has not been back, she also reports going to the [facility]. Her medications are on the $4 list, she is able to afford these OOP. Financial counselor has seen pt, pt reports she has filled for medicaid in the recent past, she was told with a new cancer dx she would be eligible.          Action/Plan: Per MD there is a potential for pt to DC home later today. She will have f/u with onc, Dr. Glo Herring making referral. CM encouraged pt to establish routine care with either [facility] or [facility]. FC has contacted [place] [facility] about medicaid application. CM encouraged pt to follow up with DSS and make sure they get the needed information to process the medicaid application. Pt given MATCH voucher in case she is discharged on any new medications. CM explained [place] program and voucher will be given to pt at DC with [facility].  Expected Discharge Date:      02/20/2017            Expected Discharge Plan:  Home/Self Care  In-House Referral:  Chaplain, Financial Counselor  Discharge planning Services  CM Consult, [facility]  Post Acute Care Choice:  NA Choice offered to:  NA  Status of Service:  Completed, signed off   Sherald Barge, RN 02/20/2017, 1:03 PM

## 2017-02-20 NOTE — Consult Note (Signed)
Medical Consultation   Tanya Wright  QHU:[id number]  DOB: May 31, 1965  DOA: 02/19/2017  PCP: [facility]   Requesting physician: Dr. Glo Herring, Jenny Reichmann.  Reason for consultation: Medical problem management- HTN, Thyroid mass.  History of Present Illness: Tanya Wright is a 51 y.o. female with past medical history of thyroid mass, suspected cervical cancer, hypertension, CKD, who presented to the ED yesterday with complaints of vaginal bleeding for about 2 months duration. Patient reports recent hospital admission on the 16th for about a week, for renal failure, at [facility]. She also reports she had stents placed in her kidneys august 2018. She reports imaging showed the cervical mass but she has not been able to see a gynecologist that she has not had insurance. Patient reports she has had neck swelling, at least 4-5 years, now. She had workup done in [place]- 2013, including blood tests and imaging. She reports over the past 2 months she has sometimes had difficulty swallowing, sometimes shortness of breath from swelling in her neck, but none recently. She feels the swelling has increased over the past 2 months . No cough, no fever, no voice change, reports constipation 2/2 iron tablets, denies heat or cold intolerance. Patient had cervical biopsy done by Dr. Glo Herring 02/19/17. Patient's home blood pressure medications were restarted, both blood pressure dropped. She received 1L bolus, with improvement in blood pressure, was started on maintenance fluids. Patient reports no vaginal bleeding today.   Review of Systems:  ROS GU- patient reports dysuria , of 2-3 days' duration . As per HPI otherwise 10 point review of systems negative.   Past Medical History: Past Medical History:  Diagnosis Date  . Hypertension   . Renal disorder   . Thyroid disease     Past Surgical History: Past Surgical History:  Procedure Laterality Date  .  STENT PLACEMENT RT URETER ([place] HX)     sent in both kidneys for stones  . TUBAL LIGATION     Allergies:  No Known Allergies  Social History:  reports that she has been smoking Cigarettes.  She has never used smokeless tobacco. She reports that she drinks alcohol. She reports that she uses drugs, including Marijuana.  Family History: History reviewed. No pertinent family history.  Physical Exam: Vitals:   02/20/17 0300 02/20/17 1046 02/20/17 1258 02/20/17 1519  BP: 100/64 (!) 85/63 (!) 95/55 107/65  Pulse: 85 83 82 90  Resp: 15  16   Temp: 98.4 F (36.9 C) 98.7 F (37.1 C) 98.3 F (36.8 C)   TempSrc: Oral Oral Oral   SpO2: 98% 99% 100%   Weight:      Height:        Constitutional: calm, operative, Alert and awake, oriented x3, not in any acute distress. Eyes: PERLA, EOMI, irises appear normal, anicteric sclera,  ENMT: external ears and nose appear normal,             Lips appears normal, oropharynx mucosa, tongue, posterior pharynx appear normal  Neck: Significant nontender swelling involving her thyroid extending laterally from midline lower neck  CVS: S1-S2 clear, no murmur rubs or gallops, no LE edema, normal pedal pulses  Respiratory:  clear to auscultation bilaterally, no wheezing, rales or rhonchi. Respiratory effort normal. No accessory muscle use.  Abdomen: soft nontender, nondistended, normal bowel sounds, no hepatosplenomegaly, no hernias  Musculoskeletal: : no cyanosis, clubbing or edema noted bilaterally  Neuro: Cranial nerves II-XII intact, 5/5 strength all extremities Psych: judgement and insight appear normal, stable mood and flat affect Skin: no rashes or lesions or ulcers, no induration or nodules   Data reviewed:  I have personally reviewed following labs and imaging studies Labs:  CBC:  Recent Labs Lab 02/19/17 1027 02/20/17 1240  WBC 7.3 6.3  NEUTROABS 4.4 3.5  HGB 8.6* 7.6*  HCT 27.3* 23.4*  MCV 90.4 87.6  PLT 433* 638    Basic Metabolic  Panel:  Recent Labs Lab 02/19/17 1027 02/20/17 1240  NA 135 137  K 4.3 3.9  CL 102 107  CO2 25 23  GLUCOSE 94 131*  BUN 15 17  CREATININE 1.80* 2.02*  CALCIUM 9.5 8.8*   GFR Estimated Creatinine Clearance: 33.5 mL/min (A) (by C-G formula based on SCr of 2.02 mg/dL (H)). Liver Function Tests:  Recent Labs Lab 02/19/17 1027  AST 17  ALT 8*  ALKPHOS 66  BILITOT 0.1*  PROT 7.4  ALBUMIN 3.5    Recent Labs Lab 02/19/17 1027  LIPASE 49   Thyroid function studies  Recent Labs  02/20/17 1240  TSH 0.524   Urinalysis    Component Value Date/Time   COLORURINE STRAW (A) 02/19/2017 1001   APPEARANCEUR HAZY (A) 02/19/2017 1001   LABSPEC 1.010 02/19/2017 1001   PHURINE 7.0 02/19/2017 1001   GLUCOSEU NEGATIVE 02/19/2017 1001   HGBUR MODERATE (A) 02/19/2017 1001   BILIRUBINUR NEGATIVE 02/19/2017 1001   [place] 02/19/2017 1001   PROTEINUR 30 (A) 02/19/2017 1001   NITRITE NEGATIVE 02/19/2017 1001   LEUKOCYTESUR LARGE (A) 02/19/2017 1001   Microbiology No results found for this or any previous visit (from the past 240 hour(s)).  Inpatient Medications:   Scheduled Meds: . docusate sodium  100 mg Oral BID  . feeding supplement (ENSURE ENLIVE)  237 mL Oral TID BM  . ferrous sulfate  975 mg Oral TID  . polyethylene glycol  17 g Oral Daily   Continuous Infusions: . sodium chloride 75 mL/hr at 02/20/17 2036  . cefTRIAXone (ROCEPHIN)  IV 1 g (02/20/17 2031)   Radiological Exams on Admission: Ct Renal Stone Study  Result Date: 02/19/2017 CLINICAL DATA:  Bilateral flank pain, nausea/vomiting, ureteral stents, history of kidney stones EXAM: CT ABDOMEN AND PELVIS WITHOUT CONTRAST TECHNIQUE: Multidetector CT imaging of the abdomen and pelvis was performed following the standard protocol without IV contrast. COMPARISON:  None. FINDINGS: Lower chest: Lung bases are clear. Hepatobiliary: Unenhanced liver is unremarkable. Gallbladder is unremarkable. No intrahepatic  for extrahepatic duct dilatation. Pancreas: Within normal limits. Spleen: Within normal limits. Adrenals/Urinary Tract: Adrenal glands within normal limits. Kidneys are notable for moderate hydroureteronephrosis with indwelling bilateral ureteral stents. No renal, ureteral, or bladder calculi. 2.6 cm posterior urethral diverticulum (series 2/image 78). Bladder is within normal limits. Stomach/Bowel: Stomach is within normal limits. No evidence of bowel obstruction. Normal appendix (series 2/ image 64). Vascular/Lymphatic: No evidence of abdominal aortic aneurysm. Atherosclerotic calcifications of the abdominal aorta and branch vessels. Extensive abdominopelvic lymphadenopathy, much of which is partially calcified, including: --1.8 cm short axis peripancreatic node (series 2/ image 28) --2.8 cm short axis left para- aortic node (series 2/ image 39) --2.6 cm short axis right common iliac node (series 2/ image 46) --1.9 cm short axis node anterior to the L5 vertebral body (series 2/ image 52) --1.8 cm short axis right external iliac node (series 2/image 66) --1.9 cm short axis right external iliac node (series 2/image 67) --1.5 cm  short axis left deep inguinal node (series 2/image 67) --1.3 cm short axis left perirectal node (series 2/image 75) Reproductive: 4.5 x 6.1 x 5.0 cm mass centered in the region of the cervix (series 2/image 69; sagittal image 55). Parametrial invasion is suspected, although poorly evaluated on CT. Associated obstructive uropathy, described above. Direct extension into the anterior rectal wall is difficult to exclude (series 2/image 70). Associated fluid trapped within the endometrial cavity (sagittal image 53). Other: No abdominopelvic ascites. Musculoskeletal: Degenerative changes of the visualized thoracolumbar spine. IMPRESSION: 6.1 cm cervical mass, poorly evaluated on unenhanced CT, suspicious for cervical cancer. Associated moderate bilateral hydroureteronephrosis with indwelling  bilateral ureteral stents. No renal or ureteral calculi. Direct invasion into the anterior rectum is not excluded. If present, this would make the FIGO staging at least stage IVA. Extensive abdominopelvic lymphadenopathy, many of which are partially calcified. In this clinical setting, nodal metastases are presumed. The presence of suspected nodal metastases outside the true pelvis would reflect stage IVB disease. Electronically Signed   By: Julian Hy M.D.   On: 02/19/2017 13:05    Impression/Recommendations Principal Problem:   Suspected cervical cancer Active Problems:   Thyroid mass of unclear etiology   Cancer determined by uterine cervix biopsy ([place])   CKD (chronic kidney disease)   UTI (urinary tract infection)  Suspected cervical cancer- biopsy done- 02/19/17 by Dr. Glo Herring. - awaiting pathology results  Thyroid mass- most likely multinodular goiter. Patient had workup in [facility] 2013, results and imaging indicate a Foley, but provider's notes not available. 02/08/2012- Patient had TSH- low normal 0.7, with normal T3 - 1.3, and mildly low T4 0.67. She also had radioiodine uptakes and scan and soft tissue ultrasound of neck- results showed multinodular goiter. TSH this admission- low normal at 0.5. - Check T4, and free T3. - Thyroid ultrasound rule out malignancy - Most likely multinodular goiter may need follow-up as outpatient with endocrinologist but this would be a challenge as she has no insurance. Also might benefit from surgery as she may be having symptoms.  Urinary tract infection- reports dysuria, UA shows large leukocytes. WBC 7.3. No fever - IV ceftriaxone 1 g daily - Urine cultures.  Acute on chronic Anemia- CBC 8.6 >>7.6 today status post cervical mass biopsy, with significant bleeding. - Transfuse 1 units, as patient's most likely has malignancy, and CKD, with prior anemia. - Hgb Am  CKD- creatinine 1.8 >>2 today. Unknown baseline. History of stent  placement.  - Continue maintenance fluids for today, and the AM - BMP a.m.  Hypertension-  1L bolus normal saline given for hypotension- likely from home blood pressure medications Norvasc and Coreg and vaginal bleeding from cervical biopsy.   Thank you for this consultation.  Our [facility] hospitalist team will follow the patient with you.  Bethena Roys M.D. [facility] [phone] 11PM- 7AM. 02/20/2017, 9:06 PM

## 2017-02-20 NOTE — Clinical Social Work Note (Signed)
Patient was referred to CSW due to having no insurance and need assistance with prescription. This is a role of CM. LCSW will notify CM of patient's need.     Varian Innes, Clydene Pugh, LCSW

## 2017-02-21 DIAGNOSIS — I959 Hypotension, unspecified: Secondary | ICD-10-CM | POA: Diagnosis present

## 2017-02-21 DIAGNOSIS — Y848 Other medical procedures as the cause of abnormal reaction of the patient, or of later complication, without mention of misadventure at the time of the procedure: Secondary | ICD-10-CM | POA: Diagnosis present

## 2017-02-21 DIAGNOSIS — K5900 Constipation, unspecified: Secondary | ICD-10-CM | POA: Diagnosis present

## 2017-02-21 DIAGNOSIS — F17210 Nicotine dependence, cigarettes, uncomplicated: Secondary | ICD-10-CM | POA: Diagnosis present

## 2017-02-21 DIAGNOSIS — N99820 Postprocedural hemorrhage and hematoma of a genitourinary system organ or structure following a genitourinary system procedure: Secondary | ICD-10-CM | POA: Diagnosis present

## 2017-02-21 DIAGNOSIS — N939 Abnormal uterine and vaginal bleeding, unspecified: Secondary | ICD-10-CM | POA: Diagnosis not present

## 2017-02-21 DIAGNOSIS — Z79899 Other long term (current) drug therapy: Secondary | ICD-10-CM | POA: Diagnosis not present

## 2017-02-21 DIAGNOSIS — N189 Chronic kidney disease, unspecified: Secondary | ICD-10-CM | POA: Diagnosis present

## 2017-02-21 DIAGNOSIS — Z23 Encounter for immunization: Secondary | ICD-10-CM | POA: Diagnosis not present

## 2017-02-21 DIAGNOSIS — C539 Malignant neoplasm of cervix uteri, unspecified: Secondary | ICD-10-CM | POA: Diagnosis present

## 2017-02-21 DIAGNOSIS — R6889 Other general symptoms and signs: Secondary | ICD-10-CM

## 2017-02-21 DIAGNOSIS — D649 Anemia, unspecified: Secondary | ICD-10-CM | POA: Diagnosis not present

## 2017-02-21 DIAGNOSIS — D630 Anemia in neoplastic disease: Secondary | ICD-10-CM | POA: Diagnosis present

## 2017-02-21 DIAGNOSIS — N136 Pyonephrosis: Secondary | ICD-10-CM | POA: Diagnosis present

## 2017-02-21 DIAGNOSIS — D62 Acute posthemorrhagic anemia: Secondary | ICD-10-CM | POA: Diagnosis not present

## 2017-02-21 DIAGNOSIS — E042 Nontoxic multinodular goiter: Secondary | ICD-10-CM | POA: Diagnosis present

## 2017-02-21 DIAGNOSIS — Z960 Presence of urogenital implants: Secondary | ICD-10-CM | POA: Diagnosis present

## 2017-02-21 DIAGNOSIS — E049 Nontoxic goiter, unspecified: Secondary | ICD-10-CM | POA: Diagnosis not present

## 2017-02-21 DIAGNOSIS — R591 Generalized enlarged lymph nodes: Secondary | ICD-10-CM | POA: Diagnosis not present

## 2017-02-21 DIAGNOSIS — I129 Hypertensive chronic kidney disease with stage 1 through stage 4 chronic kidney disease, or unspecified chronic kidney disease: Secondary | ICD-10-CM | POA: Diagnosis present

## 2017-02-21 LAB — BASIC METABOLIC PANEL
ANION GAP: 7 (ref 5–15)
BUN: 20 mg/dL (ref 6–20)
CHLORIDE: 105 mmol/L (ref 101–111)
CO2: 25 mmol/L (ref 22–32)
Calcium: 9.1 mg/dL (ref 8.9–10.3)
Creatinine, Ser: 1.77 mg/dL — ABNORMAL HIGH (ref 0.44–1.00)
GFR calc Af Amer: 37 mL/min — ABNORMAL LOW (ref >60)
GFR, EST NON AFRICAN AMERICAN: 32 mL/min — AB (ref >60)
GLUCOSE: 87 mg/dL (ref 65–99)
POTASSIUM: 4.7 mmol/L (ref 3.5–5.1)
Sodium: 137 mmol/L (ref 135–145)

## 2017-02-21 LAB — ABO/RH: ABO/RH(D): B POS

## 2017-02-21 LAB — T3, FREE: T3, Free: 3.3 pg/mL (ref 2.0–4.4)

## 2017-02-21 LAB — T4: T4 TOTAL: 6 ug/dL (ref 4.5–12.0)

## 2017-02-21 LAB — HEMOGLOBIN AND HEMATOCRIT, BLOOD
HEMATOCRIT: 26.7 % — AB (ref 36.0–46.0)
Hemoglobin: 8.8 g/dL — ABNORMAL LOW (ref 12.0–15.0)

## 2017-02-21 NOTE — Progress Notes (Signed)
PROGRESS NOTE    Tanya Wright  MPN:[id number] DOB: 03-09-1966 DOA: 02/19/2017 PCP: [facility]     Brief Narrative:  Patient is a 51 year old woman that we are seeing in consultation at the request of Dr. Glo Herring.  She was admitted to the hospital on 10/29 after brisk bleeding from a cervical biopsy.  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  Dr. Glo Herring is currently undergoing conversation with GYN oncology service at [facility] for consideration of transfer.  We are asked to see her in consultation due to a thyroid mass as well as hypertension.   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 Glo Herring.  Thyroid mass -Thyroid ultrasound reveals markedly enlarged heterogeneous thyroid with multiple confluent nodular foci.  Recommend FNA biopsy of mildly suspicious 3.6 cm mid right nodule. -If patient is to be transferred to [facility] this can be accomplished they are, if not we will see if radiology department can do this here.  Hypertension -Patient was actually hypotensive on admission which resolved with saline. -Antihypertensive agents remain on hold, blood pressure is in the low 008Q systolic.   DVT prophylaxis: SCDs Code Status: Full code Family Communication: Patient only Disposition Plan: As per primary service, GYN  Consultants:   Internal medicine  Procedures:   Biopsy of cervix  Antimicrobials:  Anti-infectives    Start     Dose/Rate Route Frequency Ordered Stop   02/20/17 1800  cefTRIAXone (ROCEPHIN) 1 g in dextrose 5 % 50 mL IVPB     1 g 100 mL/hr over 30 Minutes Intravenous Every 24 hours 02/20/17 1725     02/19/17 1100  cephALEXin (KEFLEX) capsule 500 mg     500 mg Oral  Once 02/19/17 1058 02/19/17 1108       Subjective: She reports no new issues overnight, mild abdominal pain.  Objective: Vitals:   02/21/17 0508 02/21/17 0645 02/21/17 1034 02/21/17 1521  BP: 112/70 111/68 (!) 103/55 114/77  Pulse: 84 87 81 87  Resp: 18 20  18   Temp: 99.1 F (37.3 C) 98.5 F (36.9 C)  98.8 F (37.1 C)  TempSrc: Oral Oral  Oral  SpO2: 97% 98%  100%  Weight:      Height:        Intake/Output Summary (Last 24 hours) at 02/21/17 1715 Last data filed at 02/21/17 0510  Gross per 24 hour  Intake             1541 ml  Output                0 ml  Net             1541 ml   Filed Weights   02/19/17 0922 02/19/17 1729  Weight: 65.8 kg (145 lb) 65.1 kg (143 lb 8.3 oz)    Examination:  General exam: Alert, awake, oriented x 3 Respiratory system: Clear to auscultation. Respiratory effort normal. Cardiovascular system:RRR. No murmurs, rubs, gallops. Gastrointestinal system: Abdomen is nondistended, soft and nontender. No organomegaly or masses felt. Normal bowel sounds heard. Central nervous system: Alert and oriented. No focal neurological deficits. Extremities: No C/C/E, +pedal pulses Skin: No rashes, lesions or  ulcers Psychiatry: Judgement and insight appear normal. Mood & affect appropriate.     Data Reviewed: I have personally reviewed following labs and imaging studies  CBC:  Recent Labs Lab 02/19/17 1027 02/20/17 1240 02/21/17 0559  WBC 7.3 6.3  --   NEUTROABS 4.4 3.5  --   HGB 8.6* 7.6* 8.8*  HCT 27.3* 23.4* 26.7*  MCV 90.4 87.6  --   PLT 433* 384  --    Basic Metabolic Panel:  Recent Labs Lab 02/19/17 1027 02/20/17 1240 02/21/17 0559  NA 135 137 137  K 4.3 3.9 4.7  CL 102 107 105  CO2 25 23 25   GLUCOSE 94 131* 87  BUN 15 17 20   CREATININE 1.80* 2.02* 1.77*  CALCIUM 9.5 8.8* 9.1   GFR: Estimated Creatinine Clearance: 38.2 mL/min (A) (by C-G formula based on SCr  of 1.77 mg/dL (H)). Liver Function Tests:  Recent Labs Lab 02/19/17 1027  AST 17  ALT 8*  ALKPHOS 66  BILITOT 0.1*  PROT 7.4  ALBUMIN 3.5    Recent Labs Lab 02/19/17 1027  LIPASE 49   No results for input(s): AMMONIA in the last 168 hours. Coagulation Profile: No results for input(s): INR, PROTIME in the last 168 hours. Cardiac Enzymes: No results for input(s): CKTOTAL, CKMB, CKMBINDEX, TROPONINI in the last 168 hours. BNP (last 3 results) No results for input(s): PROBNP in the last 8760 hours. HbA1C: No results for input(s): HGBA1C in the last 72 hours. CBG: No results for input(s): GLUCAP in the last 168 hours. Lipid Profile: No results for input(s): CHOL, HDL, LDLCALC, TRIG, CHOLHDL, LDLDIRECT in the last 72 hours. Thyroid Function Tests:  Recent Labs  02/20/17 1240 02/20/17 1640  TSH 0.524  --   T4TOTAL  --  6.0  T3FREE  --  3.3   Anemia Panel: No results for input(s): VITAMINB12, FOLATE, FERRITIN, TIBC, IRON, RETICCTPCT in the last 72 hours. Urine analysis:    Component Value Date/Time   COLORURINE STRAW (A) 02/19/2017 1001   APPEARANCEUR HAZY (A) 02/19/2017 1001   LABSPEC 1.010 02/19/2017 1001   PHURINE 7.0 02/19/2017 1001   GLUCOSEU NEGATIVE 02/19/2017 1001   HGBUR MODERATE (A) 02/19/2017 1001   BILIRUBINUR NEGATIVE 02/19/2017 1001   KETONESUR NEGATIVE 02/19/2017 1001   PROTEINUR 30 (A) 02/19/2017 1001   NITRITE NEGATIVE 02/19/2017 1001   LEUKOCYTESUR LARGE (A) 02/19/2017 1001   Sepsis Labs: @LABRCNTIP (procalcitonin:4,lacticidven:4)  )No results found for this or any previous visit (from the past 240 hour(s)).       Radiology Studies: US Thyroid  Result Date: 02/21/2017 CLINICAL DATA:  Asymmetric neck mass EXAM: THYROID ULTRASOUND TECHNIQUE: Ultrasound examination of the thyroid gland and adjacent soft tissues was performed. COMPARISON:  01/29/2012 at [facility], by report only FINDINGS: Parenchymal Echotexture: Markedly heterogenous Isthmus:  1.2 cm thickness, previously 1.3 Right lobe: 7.3 x 4.3 x 7.3 cm, previously 7.9 x 4.6 x 3.5 Left lobe: 7.4 x 3.7 x 7.2 cm, previously 8 x 5.4 x 3.2 _________________________________________________________ Estimated total number of nodules >/= 1 cm: At least 5 Number of spongiform nodules >/=  2 cm not described below (TR1): 0 Number of mixed cystic and solid nodules >/= 1.5 cm not described below ([place]): At least 4 _________________________________________________________ Nodule # 1: Location: Right; Mid Maximum size: 3.6 cm; Other 2 dimensions: 3.4 x 2.7 (previously 3.9 x 3.6 x 1.7 by report) Composition: mixed cystic and solid (1) Echogenicity: hypoechoic (2) Shape: not taller-than-wide (0) Margins: ill-defined (0) Echogenic foci:  Small  comet tail artifacts (0) ACR TI-RADS total points: 3. ACR TI-RADS risk category: TR3 (3 points). ACR TI-RADS recommendations: **Given size (>/= 2.5 cm) and appearance, fine needle aspiration of this mildly suspicious nodule should be considered based on TI-RADS criteria. _________________________________________________________ Nodule # 2: Location: Left; Inferior Maximum size: 3.9 cm; Other 2 dimensions: 3.8 x 3. cm (previously 3.4 x 2.5 x 2.9 cm by report) Composition: spongiform (0) Echogenicity: hypoechoic (2) Shape: not taller-than-wide (0) Margins: ill-defined (0) Echogenic foci: large comet-tail artifacts (0) ACR TI-RADS total points: 2. ACR TI-RADS risk category: TR2 (2 points). ACR TI-RADS recommendations: This nodule does NOT meet TI-RADS criteria for biopsy or dedicated follow-up. _________________________________________________________ Multiple additional lobular solid, complex, and cystic regions which are partially confluent. IMPRESSION: 1. Markedly enlarged heterogenous thyroid with multiple confluent nodular foci. 2. Recommend FNA biopsy of mildly suspicious 3.6 cm mid right nodule. The above is in keeping with the ACR TI-RADS recommendations - J Am Coll Radiol  2017;14:587-595. Electronically Signed   By: Lucrezia Europe M.D.   On: 02/21/2017 13:10        Scheduled Meds: . docusate sodium  100 mg Oral BID  . feeding supplement (ENSURE ENLIVE)  237 mL Oral TID BM  . ferrous sulfate  975 mg Oral TID  . polyethylene glycol  17 g Oral Daily   Continuous Infusions: . sodium chloride    . cefTRIAXone (ROCEPHIN)  IV Stopped (02/20/17 2101)     LOS: 1 day    Time spent: 25 minutes. Greater than 50% of this time was spent in direct contact with the patient coordinating care.     Lelon Frohlich, MD [facility] Pager [phone]  If 7PM-7AM, please contact night-coverage [email or web address] Password [email or web address] 02/21/2017, 5:15 PM

## 2017-02-21 NOTE — Progress Notes (Signed)
Subjective: I have spoke n with Dr Nancy Marus, [place] [facility] Attending at [facility], regarding [place]. Dr Alycia Rossetti has kindly agreed to accepting Ms Parran in transfer for continuation of care.  [facility] hospitals will be assuming responsibility of arranging transportation to [facility]. Currently [facility] hospitals are experiencing high volumes, and Dr Alycia Rossetti states that the [facility] transport teams will contact the hospital directly when bed availability is confirmed. I have given her the direct number to the [facility] 3rd floor nursing desk, [phone]. The patient has been notified of the arrangements made and is happy with this plan.  Objective:   LOS: 1 day    Zsofia Prout V 02/21/2017, 7:30 PM [phone] (cell)

## 2017-02-21 NOTE — Progress Notes (Signed)
Subjective:  Patient reports no changes in condition from this am.   Objective: BP (!) 103/55 (BP Location: Left Arm)   Pulse 81   Temp 98.5 F (36.9 C) (Oral)   Resp 20   Ht 5\' 11"  (1.803 m)   Wt 143 lb 8.3 oz (65.1 kg)   SpO2 98%   BMI 20.02 kg/m  CBC Latest Ref Rng & Units 02/21/2017 02/20/2017 02/19/2017  WBC 4.0 - 10.5 K/uL - 6.3 7.3  Hemoglobin 12.0 - 15.0 g/dL 8.8(L) 7.6(L) 8.6(L)  Hematocrit 36.0 - 46.0 % 26.7(L) 23.4(L) 27.3(L)  Platelets 150 - 400 K/uL - 384 433(H)    I have reviewed patient's pathology. Pathology report returns as expected, showing squamous cell cancer of cervix.  Thyroid scan of the huge thyroid mass is pending.  Assessment/Plan: 1.  Advanced squamous cell cancer of cervix. 2   Anemia, stable s/p one unit prbc at 8.8 3 Thyroid Mass 4  Constipation 5 resolved hypotension. Plan  Call in to gyn Oncology at [facility]  [phone], to see if we can transfer her there for continued care.  While inpt status is not absolutely needed if pt had transportation and average support and understanding of how to work within care systems, neither of these assets exist, so I think it may be necessary for her to be transferred inpt- inpt, and appropriate consults obtained.  I do not believe she will be able to manage all the appts needed from an outpt status.  [name] [phone] (cell)   LOS: 1 day      [name] 02/21/2017, 1:42 PM

## 2017-02-22 LAB — TYPE AND SCREEN
ABO/RH(D): B POS
ANTIBODY SCREEN: NEGATIVE
UNIT DIVISION: 0

## 2017-02-22 LAB — BPAM RBC
BLOOD PRODUCT EXPIRATION DATE: 201811242359
ISSUE DATE / TIME: 201810310157
Unit Type and Rh: 1700

## 2017-02-22 LAB — URINE CULTURE: Culture: 70000 — AB

## 2017-02-22 MED ORDER — POLYETHYLENE GLYCOL 3350 17 G PO PACK: 17.0000 g | PACK | Freq: Every day | ORAL | 0 refills | Status: DC

## 2017-02-22 NOTE — Progress Notes (Signed)
Transferred to [facility] in stable condition, [facility] transport team transported patient.

## 2017-02-22 NOTE — Discharge Summary (Signed)
Patient ID: Tanya Wright, female   DOB: 10-Jul-1965, 51 y.o.   MRN: [id number] Physician Discharge Summary  Patient ID: Tanya Wright MRN: [id number] DOB/AGE: 12/29/1965 51 y.o.  Admit date: 02/19/2017 Discharge date: 02/22/2017  Admission Diagnoses: 1. Cervical mass suspected cervical cancer 2. 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 Workup included a CT for renal stone which showed a cervical mass presence of ureteral stents courtesy of urology consult at [facility] in August and extensive lymphadenopathy throughout the pelvis with calcifications highly suspicious for advanced metastatic disease presumed to be cervix. Was unclear if it was rectal involvement. Examination the ED showed a large cervical mass with some identifiable cervical rim but extensive involvement of the parametria and the cul-de-sac on clinical exam. Biopsies returned showing invasive poorly differentiated squamous cell cancer of the cervix Hospital course was notable for hypotension on day one felt related to blood pressure medications. I'm of the impression that she was not taking her medicines and when restarted on her medications record this resulted in the hypotension there was no evidence of bleeding acutely. She was had a chronic anemia for which she was given 1 unit packed cells with stable hemoglobin  Consults: GYN oncology [facility] Dr. Ned Clines  Significant Diagnostic Studies: radiology: CT scan: .  Treatments: Thyroid scan,  transfer arrangements made  Discharge Exam: Blood pressure 102/66, pulse 85, temperature 98.8 F (37.1 C), temperature source Oral, resp. rate 18, height 5\' 11"  (1.803 m), weight 143 lb 8.3 oz (65.1 kg), SpO2 98 %. Physical Examination: General appearance - alert, well appearing, and in no distress and anxious Mental status - alert, oriented to person, place, and time, normal mood, behavior, speech, dress, motor activity, and thought processes Neck - supple, no significant adenopathy, thyroid exam: thyroid enlarged, multinodular goiter, nodule size 7-9 cm in each lobe cm Abdomen - soft, nontender, nondistended, no masses or organomegaly no inguinal adenopathy Pelvic - pelvic exam on admission showed a large central pelvic mass with fixation, extension of disease into the parametria in the cul-de-sac Extremities - peripheral pulses normal, no pedal edema, no clubbing or cyanosis, Homan's sign negative bilaterally   Disposition:    transferred [place] oncology Dr. Ned Clines for establishment of care plan    Signed: [name] 02/22/2017, 8:16 AM

## 2017-02-22 NOTE — Discharge Summary (Signed)
  Please see the transfer note dictated elsewhere. The patient is being transferred today to [facility]

## 2017-03-13 ENCOUNTER — Encounter (HOSPITAL_COMMUNITY): Payer: Self-pay

## 2017-03-14 ENCOUNTER — Other Ambulatory Visit: Payer: Self-pay

## 2017-03-14 ENCOUNTER — Inpatient Hospital Stay (HOSPITAL_COMMUNITY)
Admission: EM | Admit: 2017-03-14 | Discharge: 2017-03-16 | DRG: 755 | Disposition: A | Discharge status: Home / Self Care | Payer: Medicaid Other | Attending: Internal Medicine | Admitting: Internal Medicine

## 2017-03-14 ENCOUNTER — Encounter (HOSPITAL_COMMUNITY): Payer: Self-pay | Admitting: Emergency Medicine

## 2017-03-14 DIAGNOSIS — I959 Hypotension, unspecified: Secondary | ICD-10-CM | POA: Diagnosis present

## 2017-03-14 DIAGNOSIS — N183 Chronic kidney disease, stage 3 (moderate): Secondary | ICD-10-CM | POA: Diagnosis present

## 2017-03-14 DIAGNOSIS — K769 Liver disease, unspecified: Secondary | ICD-10-CM | POA: Diagnosis present

## 2017-03-14 DIAGNOSIS — Z960 Presence of urogenital implants: Secondary | ICD-10-CM | POA: Diagnosis present

## 2017-03-14 DIAGNOSIS — R590 Localized enlarged lymph nodes: Secondary | ICD-10-CM | POA: Diagnosis present

## 2017-03-14 DIAGNOSIS — D6481 Anemia due to antineoplastic chemotherapy: Secondary | ICD-10-CM | POA: Diagnosis present

## 2017-03-14 DIAGNOSIS — Z79899 Other long term (current) drug therapy: Secondary | ICD-10-CM

## 2017-03-14 DIAGNOSIS — F17210 Nicotine dependence, cigarettes, uncomplicated: Secondary | ICD-10-CM | POA: Diagnosis present

## 2017-03-14 DIAGNOSIS — I129 Hypertensive chronic kidney disease with stage 1 through stage 4 chronic kidney disease, or unspecified chronic kidney disease: Secondary | ICD-10-CM | POA: Diagnosis present

## 2017-03-14 DIAGNOSIS — R319 Hematuria, unspecified: Secondary | ICD-10-CM | POA: Diagnosis present

## 2017-03-14 DIAGNOSIS — E0520 Thyrotoxicosis with toxic multinodular goiter without thyrotoxic crisis or storm: Secondary | ICD-10-CM | POA: Diagnosis present

## 2017-03-14 DIAGNOSIS — C539 Malignant neoplasm of cervix uteri, unspecified: Principal | ICD-10-CM | POA: Diagnosis present

## 2017-03-14 DIAGNOSIS — D630 Anemia in neoplastic disease: Secondary | ICD-10-CM | POA: Diagnosis present

## 2017-03-14 DIAGNOSIS — N939 Abnormal uterine and vaginal bleeding, unspecified: Secondary | ICD-10-CM | POA: Diagnosis present

## 2017-03-14 DIAGNOSIS — D62 Acute posthemorrhagic anemia: Secondary | ICD-10-CM | POA: Diagnosis present

## 2017-03-14 DIAGNOSIS — T451X5A Adverse effect of antineoplastic and immunosuppressive drugs, initial encounter: Secondary | ICD-10-CM | POA: Diagnosis present

## 2017-03-14 DIAGNOSIS — N938 Other specified abnormal uterine and vaginal bleeding: Secondary | ICD-10-CM | POA: Diagnosis present

## 2017-03-14 DIAGNOSIS — D638 Anemia in other chronic diseases classified elsewhere: Secondary | ICD-10-CM

## 2017-03-14 DIAGNOSIS — D649 Anemia, unspecified: Secondary | ICD-10-CM | POA: Diagnosis present

## 2017-03-14 DIAGNOSIS — F419 Anxiety disorder, unspecified: Secondary | ICD-10-CM | POA: Diagnosis present

## 2017-03-14 LAB — CBC WITH DIFFERENTIAL/PLATELET
BASOS ABS: 0 10*3/uL (ref 0.0–0.1)
Basophils Relative: 0 %
Eosinophils Absolute: 0.1 10*3/uL (ref 0.0–0.7)
Eosinophils Relative: 1 %
HEMATOCRIT: 21 % — AB (ref 36.0–46.0)
Hemoglobin: 6.8 g/dL — CL (ref 12.0–15.0)
LYMPHS PCT: 17 %
Lymphs Abs: 1.6 10*3/uL (ref 0.7–4.0)
MCH: 28.7 pg (ref 26.0–34.0)
MCHC: 32.4 g/dL (ref 30.0–36.0)
MCV: 88.6 fL (ref 78.0–100.0)
MONO ABS: 0.8 10*3/uL (ref 0.1–1.0)
MONOS PCT: 9 %
NEUTROS ABS: 6.6 10*3/uL (ref 1.7–7.7)
Neutrophils Relative %: 73 %
Platelets: 271 10*3/uL (ref 150–400)
RBC: 2.37 MIL/uL — ABNORMAL LOW (ref 3.87–5.11)
RDW: 14.2 % (ref 11.5–15.5)
WBC: 9.1 10*3/uL (ref 4.0–10.5)

## 2017-03-14 LAB — BASIC METABOLIC PANEL
ANION GAP: 9 (ref 5–15)
BUN: 27 mg/dL — ABNORMAL HIGH (ref 6–20)
CALCIUM: 9.2 mg/dL (ref 8.9–10.3)
CHLORIDE: 105 mmol/L (ref 101–111)
CO2: 21 mmol/L — AB (ref 22–32)
Creatinine, Ser: 2.55 mg/dL — ABNORMAL HIGH (ref 0.44–1.00)
GFR calc Af Amer: 24 mL/min — ABNORMAL LOW (ref >60)
GFR calc non Af Amer: 21 mL/min — ABNORMAL LOW (ref >60)
GLUCOSE: 132 mg/dL — AB (ref 65–99)
Potassium: 3.4 mmol/L — ABNORMAL LOW (ref 3.5–5.1)
Sodium: 135 mmol/L (ref 135–145)

## 2017-03-14 LAB — TROPONIN I: Troponin I: <0.03 ng/mL (ref <0.03)

## 2017-03-14 LAB — ABO/RH: ABO/RH(D): B POS

## 2017-03-14 LAB — PREPARE RBC (CROSSMATCH)

## 2017-03-14 LAB — MAGNESIUM: Magnesium: 1.6 mg/dL — ABNORMAL LOW (ref 1.7–2.4)

## 2017-03-14 LAB — MRSA PCR SCREENING: MRSA BY PCR: NEGATIVE

## 2017-03-14 MED ORDER — LEVALBUTEROL HCL 0.63 MG/3ML IN NEBU: 0.6300 mg | INHALATION_SOLUTION | Freq: Four times a day (QID) | RESPIRATORY_TRACT | Status: DC | PRN

## 2017-03-14 MED ORDER — SODIUM CHLORIDE 0.9 % IV SOLN
INTRAVENOUS | Status: DC
  Administered 2017-03-14 – 2017-03-16 (×4): via INTRAVENOUS

## 2017-03-14 MED ORDER — ENSURE ENLIVE PO LIQD
237.0000 mL | Freq: Two times a day (BID) | ORAL | Status: DC
  Administered 2017-03-14 – 2017-03-15 (×3): 237 mL via ORAL

## 2017-03-14 MED ORDER — OXYCODONE HCL 5 MG PO TABS
5.0000 mg | ORAL_TABLET | Freq: Four times a day (QID) | ORAL | Status: DC | PRN
  Administered 2017-03-14: 10 mg via ORAL
  Filled 2017-03-14: qty 2

## 2017-03-14 MED ORDER — HYDROCODONE-ACETAMINOPHEN 5-325 MG PO TABS
1.0000 | ORAL_TABLET | ORAL | Status: DC | PRN
  Administered 2017-03-14 – 2017-03-16 (×5): 2 via ORAL
  Filled 2017-03-14 (×5): qty 2

## 2017-03-14 MED ORDER — ONDANSETRON HCL 4 MG PO TABS: 4.0000 mg | ORAL_TABLET | Freq: Four times a day (QID) | ORAL | Status: DC | PRN

## 2017-03-14 MED ORDER — SODIUM CHLORIDE 0.9 % IV BOLUS (SEPSIS)
1000.0000 mL | Freq: Once | INTRAVENOUS | Status: AC
  Administered 2017-03-14: 1000 mL via INTRAVENOUS

## 2017-03-14 MED ORDER — OXYCODONE HCL 5 MG PO TABS: 5.0000 mg | ORAL_TABLET | ORAL | Status: DC | PRN

## 2017-03-14 MED ORDER — ACETAMINOPHEN 325 MG PO TABS: 650.0000 mg | ORAL_TABLET | Freq: Four times a day (QID) | ORAL | Status: DC | PRN

## 2017-03-14 MED ORDER — ACETAMINOPHEN 650 MG RE SUPP: 650.0000 mg | Freq: Four times a day (QID) | RECTAL | Status: DC | PRN

## 2017-03-14 MED ORDER — OXYCODONE HCL 5 MG PO TABS: 5.0000 mg | ORAL_TABLET | Freq: Four times a day (QID) | ORAL | Status: DC | PRN

## 2017-03-14 MED ORDER — FERRIC SUBSULFATE 259 MG/GM EX SOLN
CUTANEOUS | Status: AC
  Filled 2017-03-14: qty 8

## 2017-03-14 MED ORDER — ONDANSETRON HCL 4 MG/2ML IJ SOLN: 4.0000 mg | Freq: Four times a day (QID) | INTRAMUSCULAR | Status: DC | PRN

## 2017-03-14 MED ORDER — SODIUM CHLORIDE 0.9 % IV SOLN
Freq: Once | INTRAVENOUS | Status: AC
  Administered 2017-03-14: 11:00:00 via INTRAVENOUS

## 2017-03-14 NOTE — H&P (Addendum)
[facility] History and Physical  Sheretta Grumbine KGU:[id number] DOB: [birth year] DOA: 03/14/2017  Referring physician:   PCP: [facility]   Chief Complaint: weakness   HPI:   51 year old female with a past medical history of thyroid mass, cervical cancer, hypertension, CK D, recently diagnosed with cervical cancer and started on chemotherapy, previously seen by Dr. Glo Herring during this and previous admission, last chemotherapy 2 weeks ago, who presents to the ED with hematuria and vaginal bleeding associated with generalized weakness. Hemoglobin was down to 6.8. Dr. Glo Herring was consulted again by EDP today , he applied hemostatic agent to the cervical area to control the bleeding. Patient ordered to receive packed red blood cells and transferred to stepdown. During her last admission patient was admitted to [facility] and transferred to [facility] where she started chemotherapy.CT scan from 02/19/17 shows a 6.1 cm cervical mass and bilateral hydroureteronephrosis.  stents (presumedly ureteral) were placed in [place], [place].   patient is now followed by  GYN oncology [facility] Dr. Ned Clines.  PET scan was obtained for further evaluation and noted soft tissue mass involving th cervix and extending into the uterus and rectum with abutment of the posterior bladder consistent with known malignancy. Additional findings included multiple hypermetabolic lymph nodes with multiple hypermetabolic hypodense liver lesions suspicious for hepatic metastasis.11/7 MRI abdomen: multiple 10-15 bilobar liver lesions and conglomerative retroperitoneal lymphadenopathy, likely metastatic . Given evidence of widespread metastatic disease, she was planned for cisplatin/paclitaxel chemotherapy. She had a right chest wall port placed by vascular interventional radiology and received her first cycle of chemotherapy with Cisplatin/Taxol/Bevacizumab on 11/8, hg trended down to 7.6, she was  subsequently transfused 1 unit packed red blood cells after which her hemoglobin/hematocrit rose appropriately to 10.1/28.0. She remained hemodynamically stable for the remainder of her admission      Review of Systems: negative for the following  Constitutional: Denies fever, chills, diaphoresis, appetite change and positive for fatigue.  HEENT: Denies photophobia, eye pain, redness, hearing loss, ear pain, congestion, sore throat, rhinorrhea, sneezing, mouth sores, trouble swallowing, neck pain, neck stiffness and tinnitus.  Respiratory: Denies SOB, DOE, cough, chest tightness, and wheezing.  Cardiovascular: Denies chest pain, palpitations and leg swelling.  Gastrointestinal: Denies nausea, vomiting, abdominal pain, diarrhea, constipation, blood in stool and abdominal distention.  Genitourinary: Denies dysuria, urgency, frequency, hematuria, flank pain and difficulty urinating.  Musculoskeletal: Denies myalgias, back pain, joint swelling, arthralgias and gait problem.  Skin: Denies pallor, rash and wound.  Neurological: Positive for dizziness, seizures, syncope, weakness, light-headedness, numbness and headaches.  Hematological: Denies adenopathy. Easy bruising, personal or family bleeding history  Psychiatric/Behavioral: Denies suicidal ideation, mood changes, confusion, nervousness, sleep disturbance and agitation       Past Medical History:  Diagnosis Date  . Hypertension   . Renal disorder   . Thyroid disease      Past Surgical History:  Procedure Laterality Date  . STENT PLACEMENT RT URETER ([place] HX)     sent in both kidneys for stones  . TUBAL LIGATION        Social History:  reports that she has been smoking cigarettes.  she has never used smokeless tobacco. She reports that she drinks alcohol. She reports that she uses drugs. Drug: Marijuana.    No Known Allergies      FAMILY HISTORY  When questioned  Directly-patient reports  No family history of HTN, CVA  ,DIABETES, TB, Cancer CAD, Bleeding Disorders, Sickle Cell, diabetes,  anemia, asthma,   Prior to Admission medications   Medication Sig Start Date End Date Taking? Authorizing Provider  acetaminophen (TYLENOL) 500 MG tablet Take 1,000 mg by mouth every 6 (six) hours as needed for mild pain, moderate pain or headache.   Yes [provider]  calcium acetate (PHOSLO) 667 MG capsule Take 1,334 mg by mouth 3 (three) times daily with meals.   Yes [provider]  ferrous sulfate 325 (65 FE) MG tablet Take 325 mg by mouth 3 (three) times daily.   Yes [provider]  FLUoxetine (PROZAC) 10 MG capsule Take 10 mg by mouth daily. 03/02/17 04/01/17 Yes [provider]  HYDROcodone-acetaminophen (NORCO/VICODIN) 5-325 MG tablet Take 1 tablet by mouth every 6 (six) hours as needed.   Yes [provider]  ibuprofen (ADVIL,MOTRIN) 600 MG tablet Take 600 mg by mouth every 6 (six) hours as needed. 03/02/17  Yes [provider]  polyethylene glycol (MIRALAX / GLYCOLAX) packet Take 17 g by mouth daily. 02/22/17  Yes [name], MD  senna (SENOKOT) 8.6 MG tablet Take 1 tablet by mouth at bedtime. 03/02/17 04/01/17 Yes [provider]     Physical Exam: Vitals:   03/14/17 1238 03/14/17 1239 03/14/17 1300 03/14/17 1400  BP:  103/75 117/80 117/76  Pulse: 92 87 90 (!) 113  Resp: 13 12 16 20   Temp:  98.7 F (37.1 C)    TempSrc:  Oral    SpO2: 100%  100% 100%  Weight:      Height:            Vitals:   03/14/17 1238 03/14/17 1239 03/14/17 1300 03/14/17 1400  BP:  103/75 117/80 117/76  Pulse: 92 87 90 (!) 113  Resp: 13 12 16 20   Temp:  98.7 F (37.1 C)    TempSrc:  Oral    SpO2: 100%  100% 100%  Weight:      Height:       Constitutional: NAD, calm, comfortable Eyes: PERRL, lids and conjunctivae normal ENMT: Mucous membranes are moist. Posterior pharynx clear of any exudate or lesions.Normal dentition.  Neck: normal, supple, no masses,  no thyromegaly Respiratory: clear to auscultation bilaterally, no wheezing, no crackles. Normal respiratory effort. No accessory muscle use.  Cardiovascular: Regular rate and rhythm, no murmurs / rubs / gallops. No extremity edema. 2+ pedal pulses. No carotid bruits.  Abdomen: no tenderness, no masses palpated. No hepatosplenomegaly. Bowel sounds positive.  Musculoskeletal: no clubbing / cyanosis. No joint deformity upper and lower extremities. Good ROM, no contractures. Normal muscle tone.  Skin: no rashes, lesions, ulcers. No induration Neurologic: CN 2-12 grossly intact. Sensation intact, DTR normal. Strength 5/5 in all 4.  Psychiatric: Normal judgment and insight. Alert and oriented x 3. Normal mood.     Labs on Admission: I have personally reviewed following labs and imaging studies  CBC: Recent Labs  Lab 03/14/17 0828  WBC 9.1  NEUTROABS 6.6  HGB 6.8*  HCT 21.0*  MCV 88.6  PLT 242    Basic Metabolic Panel: Recent Labs  Lab 03/14/17 0828  NA 135  K 3.4*  CL 105  CO2 21*  GLUCOSE 132*  BUN 27*  CREATININE 2.55*  CALCIUM 9.2    GFR: Estimated Creatinine Clearance: 25.3 mL/min (A) (by C-G formula based on SCr of 2.55 mg/dL (H)).  Liver Function Tests: No results for input(s): AST, ALT, ALKPHOS, BILITOT, PROT, ALBUMIN in the last 168 hours. No results for input(s): LIPASE, AMYLASE  in the last 168 hours. No results for input(s): AMMONIA in the last 168 hours.  Coagulation Profile: No results for input(s): INR, PROTIME in the last 168 hours. No results for input(s): DDIMER in the last 72 hours.  Cardiac Enzymes: Recent Labs  Lab 03/14/17 0828  TROPONINI <0.03    BNP (last 3 results) No results for input(s): PROBNP in the last 8760 hours.  HbA1C: No results for input(s): HGBA1C in the last 72 hours. No results found for: HGBA1C   CBG: No results for input(s): GLUCAP in the last 168 hours.  Lipid Profile: No results for input(s): CHOL, HDL, LDLCALC,  TRIG, CHOLHDL, LDLDIRECT in the last 72 hours.  Thyroid Function Tests: No results for input(s): TSH, T4TOTAL, FREET4, T3FREE, THYROIDAB in the last 72 hours.  Anemia Panel: No results for input(s): VITAMINB12, FOLATE, FERRITIN, TIBC, IRON, RETICCTPCT in the last 72 hours.  Urine analysis:    Component Value Date/Time   COLORURINE STRAW (A) 02/19/2017 1001   APPEARANCEUR HAZY (A) 02/19/2017 1001   LABSPEC 1.010 02/19/2017 1001   PHURINE 7.0 02/19/2017 1001   GLUCOSEU NEGATIVE 02/19/2017 1001   HGBUR MODERATE (A) 02/19/2017 1001   BILIRUBINUR NEGATIVE 02/19/2017 1001   KETONESUR NEGATIVE 02/19/2017 1001   PROTEINUR 30 (A) 02/19/2017 1001   NITRITE NEGATIVE 02/19/2017 1001   LEUKOCYTESUR LARGE (A) 02/19/2017 1001    Sepsis Labs: @LABRCNTIP (procalcitonin:4,lacticidven:4) )No results found for this or any previous visit (from the past 240 hour(s)).       Radiological Exams on Admission: No results found. Ct Renal Stone Study  Result Date: 02/19/2017 CLINICAL DATA:  Bilateral flank pain, nausea/vomiting, ureteral stents, history of kidney stones EXAM: CT ABDOMEN AND PELVIS WITHOUT CONTRAST TECHNIQUE: Multidetector CT imaging of the abdomen and pelvis was performed following the standard protocol without IV contrast. COMPARISON:  None. FINDINGS: Lower chest: Lung bases are clear. Hepatobiliary: Unenhanced liver is unremarkable. Gallbladder is unremarkable. No intrahepatic for extrahepatic duct dilatation. Pancreas: Within normal limits. Spleen: Within normal limits. Adrenals/Urinary Tract: Adrenal glands within normal limits. Kidneys are notable for moderate hydroureteronephrosis with indwelling bilateral ureteral stents. No renal, ureteral, or bladder calculi. 2.6 cm posterior urethral diverticulum (series 2/image 78). Bladder is within normal limits. Stomach/Bowel: Stomach is within normal limits. No evidence of bowel obstruction. Normal appendix (series 2/ image 64).  Vascular/Lymphatic: No evidence of abdominal aortic aneurysm. Atherosclerotic calcifications of the abdominal aorta and branch vessels. Extensive abdominopelvic lymphadenopathy, much of which is partially calcified, including: --1.8 cm short axis peripancreatic node (series 2/ image 28) --2.8 cm short axis left para- aortic node (series 2/ image 39) --2.6 cm short axis right common iliac node (series 2/ image 46) --1.9 cm short axis node anterior to the L5 vertebral body (series 2/ image 52) --1.8 cm short axis right external iliac node (series 2/image 66) --1.9 cm short axis right external iliac node (series 2/image 67) --1.5 cm short axis left deep inguinal node (series 2/image 67) --1.3 cm short axis left perirectal node (series 2/image 75) Reproductive: 4.5 x 6.1 x 5.0 cm mass centered in the region of the cervix (series 2/image 69; sagittal image 55). Parametrial invasion is suspected, although poorly evaluated on CT. Associated obstructive uropathy, described above. Direct extension into the anterior rectal wall is difficult to exclude (series 2/image 70). Associated fluid trapped within the endometrial cavity (sagittal image 53). Other: No abdominopelvic ascites. Musculoskeletal: Degenerative changes of the visualized thoracolumbar spine. IMPRESSION: 6.1 cm cervical mass, poorly evaluated on unenhanced CT, suspicious for  cervical cancer. Associated moderate bilateral hydroureteronephrosis with indwelling bilateral ureteral stents. No renal or ureteral calculi. Direct invasion into the anterior rectum is not excluded. If present, this would make the FIGO staging at least stage IVA. Extensive abdominopelvic lymphadenopathy, many of which are partially calcified. In this clinical setting, nodal metastases are presumed. The presence of suspected nodal metastases outside the true pelvis would reflect stage IVB disease. Electronically Signed   By: Julian Hy M.D.   On: 02/19/2017 13:05   US  Thyroid  Result Date: 02/21/2017 CLINICAL DATA:  Asymmetric neck mass EXAM: THYROID ULTRASOUND TECHNIQUE: Ultrasound examination of the thyroid gland and adjacent soft tissues was performed. COMPARISON:  01/29/2012 at [facility], by report only FINDINGS: Parenchymal Echotexture: Markedly heterogenous Isthmus: 1.2 cm thickness, previously 1.3 Right lobe: 7.3 x 4.3 x 7.3 cm, previously 7.9 x 4.6 x 3.5 Left lobe: 7.4 x 3.7 x 7.2 cm, previously 8 x 5.4 x 3.2 _________________________________________________________ Estimated total number of nodules >/= 1 cm: At least 5 Number of spongiform nodules >/=  2 cm not described below (TR1): 0 Number of mixed cystic and solid nodules >/= 1.5 cm not described below ([place]): At least 4 _________________________________________________________ Nodule # 1: Location: Right; Mid Maximum size: 3.6 cm; Other 2 dimensions: 3.4 x 2.7 (previously 3.9 x 3.6 x 1.7 by report) Composition: mixed cystic and solid (1) Echogenicity: hypoechoic (2) Shape: not taller-than-wide (0) Margins: ill-defined (0) Echogenic foci:  Small comet tail artifacts (0) ACR TI-RADS total points: 3. ACR TI-RADS risk category: TR3 (3 points). ACR TI-RADS recommendations: **Given size (>/= 2.5 cm) and appearance, fine needle aspiration of this mildly suspicious nodule should be considered based on TI-RADS criteria. _________________________________________________________ Nodule # 2: Location: Left; Inferior Maximum size: 3.9 cm; Other 2 dimensions: 3.8 x 3. cm (previously 3.4 x 2.5 x 2.9 cm by report) Composition: spongiform (0) Echogenicity: hypoechoic (2) Shape: not taller-than-wide (0) Margins: ill-defined (0) Echogenic foci: large comet-tail artifacts (0) ACR TI-RADS total points: 2. ACR TI-RADS risk category: TR2 (2 points). ACR TI-RADS recommendations: This nodule does NOT meet TI-RADS criteria for biopsy or dedicated follow-up. _________________________________________________________ Multiple additional lobular  solid, complex, and cystic regions which are partially confluent. IMPRESSION: 1. Markedly enlarged heterogenous thyroid with multiple confluent nodular foci. 2. Recommend FNA biopsy of mildly suspicious 3.6 cm mid right nodule. The above is in keeping with the ACR TI-RADS recommendations - J Am Coll Radiol 2017;14:587-595. Electronically Signed   By: Lucrezia Europe M.D.   On: 02/21/2017 13:10      EKG: Independently reviewed.   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 Glo Herring. Transfuse for anemia due to chemotherapy and on going bleeding  Will transfuse  2 units prbc      Endo: Thyroid mass, ?toxic multinodular goiter Workup at [facility] and as per history - 10/30: TSH 0.5, T3 33, T4 6 (10/30) - Thyroid US 10/31: markedly enlarged heterogenous thyroid with multiple confluent foci  - S/p w/u at [facility] in 2013. 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 Recommended surgical resection at that time.  - 11/2 Endo consult: recs CT of neck to look for esophageal compression, outpatient f/u for discussion of resection of thyroid - 11/2 Speech pathology: swallow evaluation without restrictions - 11/5 PET: mildly hypermetabolic multinodular goiter  -  seen by endocrine 11/6, patient to be evaluated by surg onc although not an optimal time for thyroid removal. Meds and ablation not indicated for hyperthyroidism    Hypotension Patient was actually hypotensive on admission   BP improved after PRBC transfusion Antihypertensive agents remain on hold,         DVT prophylaxis: scd; s     Code Status Orders full code            consults called:  Family Communication: Admission, patients condition and plan of care including tests being ordered have been discussed  with the patient  who indicates understanding and agree with the plan and Code Status  Admission status: observation  Disposition plan: Further plan will depend as patient's clinical course evolves and further radiologic and laboratory data become available. Likely home when stable    Reyne Dumas MD [facility] Pager [phone]  If 7PM-7AM, please contact night-coverage [email or web address] Password [facility]  03/14/2017, 2:43 PM

## 2017-03-14 NOTE — ED Notes (Signed)
CRITICAL VALUE ALERT  Critical Value:  hgb 6.8  Date & Time Notied:  03/14/17 1505  Provider Notified:dr ferguson/cook  Orders Received/Actions taken:

## 2017-03-14 NOTE — ED Triage Notes (Signed)
Per EMS patient called out due to home due to blood in urine and weakness. Patient states she has not urinated since yesterday. She states she unsure if she has blood in urine or vaginal bleeding.   Was diagnosed with cervical cancer by Dr. Glo Herring a few months ago. Patient states she had last chemo 2 weeks ago.

## 2017-03-14 NOTE — ED Provider Notes (Signed)
[facility] EMERGENCY DEPARTMENT Provider Note   CSN: [id number] Arrival date & time: 03/14/17  0741     History   Chief Complaint Chief Complaint  Patient presents with  . Hematuria  . Hypotension    HPI Tanya Wright is a 51 y.o. female.  Level 5 caveat for urgent need for intervention.  Patient presents with vaginal bleeding that started in the middle the night and hypotension.  She was recently diagnosed with cervical cancer and initially admitted to [facility] and transferred to [facility] where chemotherapy was started..  CT scan from 02/19/17 shows a 6.1 cm cervical mass and bilateral hydroureteronephrosis.  Yesterday stents (presumedly ureteral) were placed in [place], [place].   Review of systems positive for lightheadedness and chest pain      Past Medical History:  Diagnosis Date  . Hypertension   . Renal disorder   . Thyroid disease     Patient Active Problem List   Diagnosis Date Noted  . CKD (chronic kidney disease) 02/20/2017  . UTI (urinary tract infection) 02/20/2017  . 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 Thyroid mass of unclear etiology 02/19/2017  . Cancer determined by uterine cervix biopsy ([place]) 02/19/2017    Past Surgical History:  Procedure Laterality Date  . STENT PLACEMENT RT URETER ([place] HX)     sent in both kidneys for stones  . TUBAL LIGATION      OB History    No data available       Home Medications    Prior to Admission medications   Medication Sig Start Date End Date Taking? Authorizing Provider  acetaminophen (TYLENOL) 500 MG tablet Take 1,000 mg by mouth every 6 (six) hours as needed for mild pain, moderate pain or headache.    [provider]  polyethylene glycol (MIRALAX / GLYCOLAX) packet Take 17 g by mouth daily. 02/22/17   [name], MD    Family History No family history on file.  Social History Social History   Tobacco Use  . Smoking status: Current Some Day Smoker    Types:  Cigarettes  . Smokeless tobacco: Never Used  Substance Use Topics  . Alcohol use: Yes  . Drug use: Yes    Types: Marijuana     Allergies   Patient has no known allergies.   Review of Systems Review of Systems  Unable to perform ROS: Acuity of condition     Physical Exam Updated Vital Signs BP (!) 81/67 (BP Location: Right Arm)   Pulse (!) 101   Temp 97.6 F (36.4 C) (Oral)   Resp 20   Ht 5\' 11"  (1.803 m)   Wt 64.9 kg (143 lb)   LMP 02/28/2017   SpO2 100%   BMI 19.94 kg/m   Physical Exam  Constitutional: She is oriented to person, place, and time.  hypotensive  HENT:  Head: Normocephalic and atraumatic.  Eyes: Conjunctivae are normal.  Neck: Neck supple.  Cardiovascular: Normal rate and regular rhythm.  Pulmonary/Chest: Effort normal and breath sounds normal.  Abdominal: Soft. Bowel sounds are normal.  Genitourinary:  Genitourinary Comments: Large amount of clotted blood in vaginal vault.  Cervix was identified.  Minimal bleeding from os  Musculoskeletal: Normal range of motion.  Neurological: She is alert and oriented to person, place, and time.  Skin: Skin is warm and dry.  Psychiatric: She has a normal mood and affect. Her behavior is normal.  Nursing note and vitals reviewed.    ED Treatments /  Results  Labs (all labs ordered are listed, but only abnormal results are displayed) Labs Reviewed  CBC WITH DIFFERENTIAL/PLATELET - Abnormal; Notable for the following components:      Result Value   RBC 2.37 (*)    Hemoglobin 6.8 (*)    HCT 21.0 (*)    All other components within normal limits  BASIC METABOLIC PANEL  TROPONIN I  TYPE AND SCREEN  PREPARE RBC (CROSSMATCH)    EKG  EKG Interpretation None       Radiology No results found.  Procedures Procedures (including critical care time)  Medications Ordered in ED Medications  0.9 %  sodium chloride infusion (not administered)  sodium chloride 0.9 % bolus 1,000 mL (1,000 mLs Intravenous  New Bag/Given 03/14/17 0838)  sodium chloride 0.9 % bolus 1,000 mL (1,000 mLs Intravenous New Bag/Given 03/14/17 1517)     Initial Impression / Assessment and Plan / ED Course  I have reviewed the triage vital signs and the nursing notes.  Pertinent labs & imaging results that were available during my care of the patient were reviewed by me and considered in my medical decision making (see chart for details).     Patient presents with vaginal bleeding and a recent diagnosis of cervical cancer.  Blood pressure is soft, but she is not tachycardic.  Dr. Glo Herring OB/GYN was consulted.  He applied a hemostatic agent to the cervical area.  Will transfuse RBC's secondary to anemia.  Admit to general medicine.    CRITICAL CARE Performed by: Nat Christen  ?  Total critical care time: 40 minutes  Critical care time was exclusive of separately billable procedures and treating other patients.  Critical care was necessary to treat or prevent imminent or life-threatening deterioration.  Critical care was time spent personally by me on the following activities: development of treatment plan with patient and/or surrogate as well as nursing, discussions with consultants, evaluation of patient's response to treatment, examination of patient, obtaining history from patient or surrogate, ordering and performing treatments and interventions, ordering and review of laboratory studies, ordering and review of radiographic studies, pulse oximetry and re-evaluation of patient's condition.   Final Clinical Impressions(s) / ED Diagnoses   Final diagnoses:  Anemia, unspecified type  Malignant neoplasm of cervix, unspecified site [facility])    ED Discharge Orders    None       Nat Christen, MD 03/14/17 [phone]

## 2017-03-15 DIAGNOSIS — D500 Iron deficiency anemia secondary to blood loss (chronic): Secondary | ICD-10-CM

## 2017-03-15 LAB — CBC
HEMATOCRIT: 26.7 % — AB (ref 36.0–46.0)
Hemoglobin: 8.7 g/dL — ABNORMAL LOW (ref 12.0–15.0)
MCH: 29.3 pg (ref 26.0–34.0)
MCHC: 32.6 g/dL (ref 30.0–36.0)
MCV: 89.9 fL (ref 78.0–100.0)
PLATELETS: 180 10*3/uL (ref 150–400)
RBC: 2.97 MIL/uL — AB (ref 3.87–5.11)
RDW: 14 % (ref 11.5–15.5)
WBC: 7.5 10*3/uL (ref 4.0–10.5)

## 2017-03-15 LAB — COMPREHENSIVE METABOLIC PANEL
ALBUMIN: 2.5 g/dL — AB (ref 3.5–5.0)
ALT: 8 U/L — AB (ref 14–54)
AST: 16 U/L (ref 15–41)
Alkaline Phosphatase: 50 U/L (ref 38–126)
Anion gap: 4 — ABNORMAL LOW (ref 5–15)
BUN: 19 mg/dL (ref 6–20)
CHLORIDE: 109 mmol/L (ref 101–111)
CO2: 23 mmol/L (ref 22–32)
CREATININE: 1.74 mg/dL — AB (ref 0.44–1.00)
Calcium: 8.4 mg/dL — ABNORMAL LOW (ref 8.9–10.3)
GFR calc Af Amer: 38 mL/min — ABNORMAL LOW (ref >60)
GFR, EST NON AFRICAN AMERICAN: 33 mL/min — AB (ref >60)
GLUCOSE: 91 mg/dL (ref 65–99)
Potassium: 4 mmol/L (ref 3.5–5.1)
Sodium: 136 mmol/L (ref 135–145)
Total Bilirubin: 0.5 mg/dL (ref 0.3–1.2)
Total Protein: 5.3 g/dL — ABNORMAL LOW (ref 6.5–8.1)

## 2017-03-15 LAB — PREPARE RBC (CROSSMATCH)

## 2017-03-15 MED ORDER — CLONAZEPAM 0.5 MG PO TABS
1.0000 mg | ORAL_TABLET | Freq: Once | ORAL | Status: AC
  Administered 2017-03-15: 1 mg via ORAL
  Filled 2017-03-15: qty 2

## 2017-03-15 MED ORDER — HYDROCODONE-ACETAMINOPHEN 5-325 MG PO TABS: 1.0000 | ORAL_TABLET | Freq: Four times a day (QID) | ORAL | 0 refills | Status: DC | PRN

## 2017-03-15 MED ORDER — CLONAZEPAM 0.5 MG PO TABS: 0.5000 mg | ORAL_TABLET | Freq: Two times a day (BID) | ORAL | Status: DC | PRN

## 2017-03-15 MED ORDER — POLYETHYLENE GLYCOL 3350 17 G PO PACK
17.0000 g | PACK | Freq: Every day | ORAL | Status: DC
  Administered 2017-03-15: 17 g via ORAL
  Filled 2017-03-15: qty 1

## 2017-03-15 MED ORDER — SODIUM CHLORIDE 0.9 % IV SOLN
Freq: Once | INTRAVENOUS | Status: AC
  Administered 2017-03-15: 10:00:00 via INTRAVENOUS

## 2017-03-15 MED ORDER — MAGNESIUM SULFATE 2 GM/50ML IV SOLN
2.0000 g | Freq: Once | INTRAVENOUS | Status: AC
  Administered 2017-03-15: 2 g via INTRAVENOUS
  Filled 2017-03-15: qty 50

## 2017-03-15 NOTE — Progress Notes (Signed)
[facility] PROGRESS NOTE  Tanya Wright YIF:[id number] DOB: 1966-04-05 DOA: 03/14/2017   PCP: Tanya Wright     Assessment/Plan: Principal Problem:   Anemia in other chronic diseases classified elsewhere Active Problems:   Uterine cervix cancer (Tanya Wright)   Cancer determined by uterine cervix biopsy (Tanya Wright)   Anemia   52 yr female with advanced stage IV cervical cancer, being followed at [facility], receiving Chemotherapy q 3 weeks with next regimen to be done 03/23/17, who was admitted yesterday morning with anemia and hypotension due to chronic and acute vaginal bleeding     Assessment/Plan    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 Glo Herring. Transfuse for anemia due to chemotherapy and on going bleeding  Hg not appropriately corrected after 3    units prbc, Gyn suggested 2 more units  Recheck cbc in am      Endo: Thyroid mass, ?toxic multinodular goiter Workup at [facility] and as per history - 10/30: TSH 0.5, T3 33, T4 6 (10/30) - Thyroid US 10/31: markedly enlarged heterogenous thyroid with multiple confluent foci  - S/p w/u at [facility] in 2013. 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 Recommended surgical resection at that time.  - 11/2 Endo consult: recs CT of neck to look for esophageal compression, outpatient f/u for discussion of resection of thyroid - 11/2 Speech pathology: swallow evaluation without restrictions - 11/5 PET: mildly hypermetabolic multinodular goiter  - seen by endocrine 11/6, patient to be evaluated by surg onc although not an optimal time for thyroid removal. Meds and ablation not indicated for hyperthyroidism    Hypotension, improved after transfusion Was likely due to bleeding and anemia   BP improved after PRBC transfusion Antihypertensive agents remain on hold,    Anxiety Can use prn klonopin if she needs it    DVT prophylaxsis scd;s   Code Status:  Full code    Family  Communication: Discussed in detail with Tanya patient, all imaging results, lab results explained to Tanya patient   Disposition Plan:  Likely DC in am       Consultants:  Gyn   Procedures:  None   Antibiotics: Anti-infectives (From admission, onward)   None         HPI/Subjective: Tearful after talking to gyn about getting hospice involved  Objective: Vitals:   03/15/17 0915 03/15/17 0930 03/15/17 0945 03/15/17 1000  BP:  (!) 89/55 (!) 89/55 96/63  Pulse: 96 98 (!) 104 92  Resp:   12 15  Temp:   98.9 F (37.2 C) 99 F (37.2 C)  TempSrc:   Oral Oral  SpO2: 98% 99% 99% 99%  Weight:      Height:        Intake/Output Summary (Last 24 hours) at 03/15/2017 1009 Last data filed at 03/15/2017 1000 Gross per 24 hour  Intake 4982.92 ml  Output 650 ml  Net 4332.92 ml    Exam:  Examination:  General exam: Appears calm and comfortable  Respiratory system: Clear to auscultation. Respiratory effort normal. Cardiovascular system: S1 & S2 heard, RRR. No JVD, murmurs, rubs, gallops or clicks. No pedal edema. Gastrointestinal system: Abdomen is nondistended, soft and nontender. No organomegaly or masses felt. Normal bowel sounds heard. Central nervous system: Alert and oriented. No focal neurological deficits. Extremities: Symmetric 5 x 5 power. Skin: No rashes, lesions or ulcers Psychiatry: Judgement and insight appear normal. Mood & affect appropriate.     Data  Reviewed: I have personally reviewed following labs and imaging studies  Micro Results Recent Results (from Tanya past 240 hour(s))  MRSA PCR Screening     Status: None   Collection Time: 03/14/17 12:52 PM  Result Value Ref Range Status   MRSA by PCR NEGATIVE NEGATIVE Final    Comment:        Tanya GeneXpert MRSA Assay (FDA approved for NASAL specimens only), is one component of a comprehensive MRSA colonization surveillance program. It is not intended to diagnose MRSA infection nor to guide or monitor  treatment for MRSA infections.     Radiology Reports Ct Renal Stone Study  Result Date: 02/19/2017 CLINICAL DATA:  Bilateral flank pain, nausea/vomiting, ureteral stents, history of kidney stones EXAM: CT ABDOMEN AND PELVIS WITHOUT CONTRAST TECHNIQUE: Multidetector CT imaging of Tanya abdomen and pelvis was performed following Tanya standard protocol without IV contrast. COMPARISON:  None. FINDINGS: Lower chest: Lung bases are clear. Hepatobiliary: Unenhanced liver is unremarkable. Gallbladder is unremarkable. No intrahepatic for extrahepatic duct dilatation. Pancreas: Within normal limits. Spleen: Within normal limits. Adrenals/Urinary Tract: Adrenal glands within normal limits. Kidneys are notable for moderate hydroureteronephrosis with indwelling bilateral ureteral stents. No renal, ureteral, or bladder calculi. 2.6 cm posterior urethral diverticulum (series 2/image 78). Bladder is within normal limits. Stomach/Bowel: Stomach is within normal limits. No evidence of bowel obstruction. Normal appendix (series 2/ image 64). Vascular/Lymphatic: No evidence of abdominal aortic aneurysm. Atherosclerotic calcifications of Tanya abdominal aorta and branch vessels. Extensive abdominopelvic lymphadenopathy, much of which is partially calcified, including: --1.8 cm short axis peripancreatic node (series 2/ image 28) --2.8 cm short axis left para- aortic node (series 2/ image 39) --2.6 cm short axis right common iliac node (series 2/ image 46) --1.9 cm short axis node anterior to Tanya L5 vertebral body (series 2/ image 52) --1.8 cm short axis right external iliac node (series 2/image 66) --1.9 cm short axis right external iliac node (series 2/image 67) --1.5 cm short axis left deep inguinal node (series 2/image 67) --1.3 cm short axis left perirectal node (series 2/image 75) Reproductive: 4.5 x 6.1 x 5.0 cm mass centered in Tanya region of Tanya cervix (series 2/image 69; sagittal image 55). Parametrial invasion is suspected,  although poorly evaluated on CT. Associated obstructive uropathy, described above. Direct extension into Tanya anterior rectal wall is difficult to exclude (series 2/image 70). Associated fluid trapped within Tanya endometrial cavity (sagittal image 53). Other: No abdominopelvic ascites. Musculoskeletal: Degenerative changes of Tanya visualized thoracolumbar spine. IMPRESSION: 6.1 cm cervical mass, poorly evaluated on unenhanced CT, suspicious for cervical cancer. Associated moderate bilateral hydroureteronephrosis with indwelling bilateral ureteral stents. No renal or ureteral calculi. Direct invasion into Tanya anterior rectum is not excluded. If present, this would make Tanya FIGO staging at least stage IVA. Extensive abdominopelvic lymphadenopathy, many of which are partially calcified. In this clinical setting, nodal metastases are presumed. Tanya presence of suspected nodal metastases outside Tanya true pelvis would reflect stage IVB disease. Electronically Signed   By: Julian Hy M.D.   On: 02/19/2017 13:05   US Thyroid  Result Date: 02/21/2017 CLINICAL DATA:  Asymmetric neck mass EXAM: THYROID ULTRASOUND TECHNIQUE: Ultrasound examination of Tanya thyroid gland and adjacent soft tissues was performed. COMPARISON:  01/29/2012 at [facility], by report only FINDINGS: Parenchymal Echotexture: Markedly heterogenous Isthmus: 1.2 cm thickness, previously 1.3 Right lobe: 7.3 x 4.3 x 7.3 cm, previously 7.9 x 4.6 x 3.5 Left lobe: 7.4 x 3.7 x 7.2 cm, previously 8 x 5.4 x 3.2  _________________________________________________________ Estimated total number of nodules >/= 1 cm: At least 5 Number of spongiform nodules >/=  2 cm not described below (TR1): 0 Number of mixed cystic and solid nodules >/= 1.5 cm not described below ([place]): At least 4 _________________________________________________________ Nodule # 1: Location: Right; Mid Maximum size: 3.6 cm; Other 2 dimensions: 3.4 x 2.7 (previously 3.9 x 3.6 x 1.7 by report)  Composition: mixed cystic and solid (1) Echogenicity: hypoechoic (2) Shape: not taller-than-wide (0) Margins: ill-defined (0) Echogenic foci:  Small comet tail artifacts (0) ACR TI-RADS total points: 3. ACR TI-RADS risk category: TR3 (3 points). ACR TI-RADS recommendations: **Given size (>/= 2.5 cm) and appearance, fine needle aspiration of this mildly suspicious nodule should be considered based on TI-RADS criteria. _________________________________________________________ Nodule # 2: Location: Left; Inferior Maximum size: 3.9 cm; Other 2 dimensions: 3.8 x 3. cm (previously 3.4 x 2.5 x 2.9 cm by report) Composition: spongiform (0) Echogenicity: hypoechoic (2) Shape: not taller-than-wide (0) Margins: ill-defined (0) Echogenic foci: large comet-tail artifacts (0) ACR TI-RADS total points: 2. ACR TI-RADS risk category: TR2 (2 points). ACR TI-RADS recommendations: This nodule does NOT meet TI-RADS criteria for biopsy or dedicated follow-up. _________________________________________________________ Multiple additional lobular solid, complex, and cystic regions which are partially confluent. IMPRESSION: 1. Markedly enlarged heterogenous thyroid with multiple confluent nodular foci. 2. Recommend FNA biopsy of mildly suspicious 3.6 cm mid right nodule. Tanya above is in keeping with Tanya ACR TI-RADS recommendations - J Am Coll Radiol 2017;14:587-595. Electronically Signed   By: Lucrezia Europe M.D.   On: 02/21/2017 13:10     CBC Recent Labs  Lab 03/14/17 0828 03/15/17 0537  WBC 9.1 7.5  HGB 6.8* 8.7*  HCT 21.0* 26.7*  PLT 271 180  MCV 88.6 89.9  MCH 28.7 29.3  MCHC 32.4 32.6  RDW 14.2 14.0  LYMPHSABS 1.6  --   MONOABS 0.8  --   EOSABS 0.1  --   BASOSABS 0.0  --     Chemistries  Recent Labs  Lab 03/14/17 0828 03/14/17 1411 03/15/17 0537  NA 135  --  136  K 3.4*  --  4.0  CL 105  --  109  CO2 21*  --  23  GLUCOSE 132*  --  91  BUN 27*  --  19  CREATININE 2.55*  --  1.74*  CALCIUM 9.2  --  8.4*  MG   --  1.6*  --   AST  --   --  16  ALT  --   --  8*  ALKPHOS  --   --  50  BILITOT  --   --  0.5   ------------------------------------------------------------------------------------------------------------------ estimated creatinine clearance is 37 mL/min (A) (by C-G formula based on SCr of 1.74 mg/dL (H)). ------------------------------------------------------------------------------------------------------------------ No results for input(s): HGBA1C in Tanya last 72 hours. ------------------------------------------------------------------------------------------------------------------ No results for input(s): CHOL, HDL, LDLCALC, TRIG, CHOLHDL, LDLDIRECT in Tanya last 72 hours. ------------------------------------------------------------------------------------------------------------------ No results for input(s): TSH, T4TOTAL, T3FREE, THYROIDAB in Tanya last 72 hours.  Invalid input(s): FREET3 ------------------------------------------------------------------------------------------------------------------ No results for input(s): VITAMINB12, FOLATE, FERRITIN, TIBC, IRON, RETICCTPCT in Tanya last 72 hours.  Coagulation profile No results for input(s): INR, PROTIME in Tanya last 168 hours.  No results for input(s): DDIMER in Tanya last 72 hours.  Cardiac Enzymes Recent Labs  Lab 03/14/17 0828  TROPONINI <0.03   ------------------------------------------------------------------------------------------------------------------ Invalid input(s): POCBNP   CBG: No results for input(s): GLUCAP in Tanya last 168 hours.     Studies: No results found.  No results found for: HGBA1C Lab Results  Component Value Date   CREATININE 1.74 (H) 03/15/2017       Scheduled Meds: . feeding supplement (ENSURE ENLIVE)  237 mL Oral BID BM   Continuous Infusions: . sodium chloride Stopped (03/15/17 0945)     LOS: 1 day    Time spent: >30 MINS    Reyne Dumas  [facility] Pager [phone]. If 7PM-7AM, please contact night-coverage at [email or web address], password [facility] 03/15/2017, 10:09 AM  LOS: 1 day

## 2017-03-15 NOTE — Progress Notes (Addendum)
Subjective:HD 2 51 yr female with advanced stage IV cervical cancer, being followed at [facility], receiving Chemotherapy q 3 weeks with next regimen to be done 03/23/17, who was admitted yesterday morning with anemia and hypotension due to chronic and acute vaginal bleeding from the cancer in the cervix. She was treated in the ED with Monsels solution (Ferrous Subsulfate) applied to the cervix with resolution (temporary) of the vaginal bleeding from the cancer site. Patient reports tolerating PO and no problems voiding.   Pt does not have family here to provide care for her, family is visiting others in [place] for the holiday. Family available in the morning.   Objective: I have reviewed patient's vital signs and labs. CBC Latest Ref Rng & Units 03/15/2017 03/14/2017 02/21/2017  WBC 4.0 - 10.5 K/uL 7.5 9.1 -  Hemoglobin 12.0 - 15.0 g/dL 8.7(L) 6.8(LL) 8.8(L)  Hematocrit 36.0 - 46.0 % 26.7(L) 21.0(L) 26.7(L)  Platelets 150 - 400 K/uL 180 271 -   BMET    Component Value Date/Time   NA 136 03/15/2017 0537   K 4.0 03/15/2017 0537   CL 109 03/15/2017 0537   CO2 23 03/15/2017 0537   GLUCOSE 91 03/15/2017 0537   BUN 19 03/15/2017 0537   CREATININE 1.74 (H) 03/15/2017 0537   CALCIUM 8.4 (L) 03/15/2017 0537   GFRNONAA 33 (L) 03/15/2017 0537   GFRAA 38 (L) 03/15/2017 0537    General: alert, cooperative, distracted and anxious , tearful Resp: clear to auscultation bilaterally GI: soft, non-tender; bowel sounds normal; no masses,  no organomegaly Vaginal Bleeding: minimal                             Brown d/c from the [facility] solution. Pt has portacath in place right chest. Assessment/Plan: Stage IV Cervical cancer, with renal stents in place Anemia of cancer and chronic blood loss  Plan" : will transfuse 2 more units prbc             Probable d/c in a.m.             Social work consult to see if pt can be connected with Hospice services in [place]. I have brought up the issue  with her, and encouraged her to be in contact with Hospice to learn what support they can provide, as her family is NOT actively involved in her care, partially because of patient reluctance to ask them ("they have their own family to care for") and logistic difficulties of receiving tertiary care while living in remote area of [place]. Currently pt gets to [facility] by using bus transport which cannot meet needs when pt has acute changes in condition.  LOS: 1 day    [name] 03/15/2017, 8:48 AM

## 2017-03-16 LAB — TYPE AND SCREEN
ABO/RH(D): B POS
ANTIBODY SCREEN: NEGATIVE
UNIT DIVISION: 0
UNIT DIVISION: 0
Unit division: 0
Unit division: 0
Unit division: 0

## 2017-03-16 LAB — BPAM RBC
BLOOD PRODUCT EXPIRATION DATE: 201812112359
BLOOD PRODUCT EXPIRATION DATE: 201812122359
BLOOD PRODUCT EXPIRATION DATE: 201812122359
BLOOD PRODUCT EXPIRATION DATE: 201812122359
Blood Product Expiration Date: 201812102359
ISSUE DATE / TIME: 201811211011
ISSUE DATE / TIME: 201811211707
ISSUE DATE / TIME: 201811220931
ISSUE DATE / TIME: 201811211435
ISSUE DATE / TIME: 201811221153
UNIT TYPE AND RH: 1700
UNIT TYPE AND RH: 5100
UNIT TYPE AND RH: 5100
Unit Type and Rh: 1700
Unit Type and Rh: 5100

## 2017-03-16 LAB — CBC
HCT: 31.2 % — ABNORMAL LOW (ref 36.0–46.0)
HEMOGLOBIN: 10.2 g/dL — AB (ref 12.0–15.0)
MCH: 30.2 pg (ref 26.0–34.0)
MCHC: 32.7 g/dL (ref 30.0–36.0)
MCV: 92.3 fL (ref 78.0–100.0)
Platelets: 180 10*3/uL (ref 150–400)
RBC: 3.38 MIL/uL — AB (ref 3.87–5.11)
RDW: 14.2 % (ref 11.5–15.5)
WBC: 6.2 10*3/uL (ref 4.0–10.5)

## 2017-03-16 LAB — HIV ANTIBODY (ROUTINE TESTING W REFLEX): HIV SCREEN 4TH GENERATION: NONREACTIVE

## 2017-03-16 MED ORDER — HEPARIN SOD (PORK) LOCK FLUSH 100 UNIT/ML IV SOLN
500.0000 [IU] | INTRAVENOUS | Status: AC | PRN
  Administered 2017-03-16: 500 [IU]
  Filled 2017-03-16: qty 5

## 2017-03-16 NOTE — Plan of Care (Signed)
Pt alert and oriented x4. Able to make needs known. Rates pain a 6 out of 10. Has NS at 42ml/hr to right chest port. SCDs in place. Telemetry in place. Call light within reach. Bed in lowest position. Will continue to monitor.

## 2017-03-16 NOTE — Progress Notes (Signed)
Patient discharged with personal belongings, pain meds prescriptions, and chest port deaccessed.

## 2017-03-16 NOTE — Consult Note (Signed)
Reason for Consult:vaginal bleeding Referring Physician: ED physician  Tanya Wright is an 51 y.o. female. With known cervical cancer, whom I referred last month to [facility] after she presented to the emergency department here for similar heavy vaginal bleeding and was found to have a central cervical mass which biopsy showed to be poorly differentiated cervical cancer squamous cell origin. She is currently undergone one regimen of chemotherapy and workup has included identification of liver lesions, presumed stage IV cervical cancer More recently one day prior to admission she was seen and ample [facility] for replacement of her ureteral stents. She attributes the increased bleeding to her stents at this no evidence that she was on anticoagulants at this time Pertinent Gynecological History: Menses: post-menopausal Bleeding: Heavy vaginal bleeding for the last day or 2 with chronic light bleeding preceding that Contraception: post menopausal status DES exposure: unknown Blood transfusions: none Sexually transmitted diseases: no past history Previous GYN Procedures: Cervical biopsies  Last mammogram:  Date:  Last pap: None on record Date:  OB History: G, P   Menstrual History: Menarche age:  Patient's last menstrual period was 02/28/2017.    Past Medical History:  Diagnosis Date  . Hypertension   . Renal disorder   . Thyroid disease     Past Surgical History:  Procedure Laterality Date  . STENT PLACEMENT RT URETER ([place] HX)     sent in both kidneys for stones  . TUBAL LIGATION      History reviewed. No pertinent family history.  Social History:  reports that she has been smoking cigarettes.  she has never used smokeless tobacco. She reports that she drinks alcohol. She reports that she uses drugs. Drug: Marijuana.  Allergies: No Known Allergies  Medications: I have reviewed the patient's current medications.  ROS  Blood pressure 103/69, pulse 87,  temperature 98.9 F (37.2 C), temperature source Oral, resp. rate 18, height 5\' 11"  (1.803 m), weight 136 lb 11 oz (62 kg), last menstrual period 02/28/2017, SpO2 100 %. Physical Exam  Constitutional: She appears well-developed. She appears distressed.  HENT:  Head: Normocephalic.  Neck: Thyromegaly present.  Cardiovascular: Normal rate.  Respiratory: Effort normal.  GI: Soft.  Genitourinary: Vagina normal.  Genitourinary Comments: Moderate blood per vagina. Speculum exam shows that she has has a friable mass in the endocervix. Monsel solution is applied liberally in the endocervical area and results and good coagulation of the mass effect. Bimanual loose exam is deferred due to patient anxiety     Assessment/Plan: Time of admission patient had chronic anemia with superimposed acute bleeding due to the cervical mass. This is been treated with Monsel solution satisfactorily. She needs admission for transfusions and stabilization and will probably have brief hospitalization transfusion 3 units packed cells recommended discussed with ED physician and will follow patient during her inpatient care  Tanya Wright 03/16/2017  CBC Latest Ref Rng & Units 03/16/2017 03/15/2017 03/14/2017  WBC 4.0 - 10.5 K/uL 6.2 7.5 9.1  Hemoglobin 12.0 - 15.0 g/dL 10.2(L) 8.7(L) 6.8(LL)  Hematocrit 36.0 - 46.0 % 31.2(L) 26.7(L) 21.0(L)  Platelets 150 - 400 K/uL 180 180 271

## 2017-03-16 NOTE — Progress Notes (Signed)
Subjective: Patient reports tolerating PO and no problems voiding.   She denies further bleeding, only brown d/c from the [facility] solution applied to the cervix.  Objective: I have reviewed patient's vital signs and labs. CBC Latest Ref Rng & Units 03/16/2017 03/15/2017 03/14/2017  WBC 4.0 - 10.5 K/uL 6.2 7.5 9.1  Hemoglobin 12.0 - 15.0 g/dL 10.2(L) 8.7(L) 6.8(LL)  Hematocrit 36.0 - 46.0 % 31.2(L) 26.7(L) 21.0(L)  Platelets 150 - 400 K/uL 180 180 271    General: alert, cooperative and no distress GI: soft, non-tender; bowel sounds normal; no masses,  no organomegaly Vaginal Bleeding: minimal   Assessment/Plan: Symptomatic anemia of chronic disease,corrected and acute bleeding, resolved,  Plan D/c home f F.u at [facility] oncology, 30 November for next Chemotx.  I have informed pt that she may call our office for any recurrences of bleeding, but to call earlier, and not wait until symptomatic as she did this time.    LOS: 2 days    [name] 03/16/2017, 8:24 AM

## 2017-03-16 NOTE — Discharge Summary (Addendum)
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[place])   Cancer determined by uterine cervix biopsy ([place])   Anemia   Discharge Condition: stable   Diet recommendation: low sodium   Filed Weights   03/14/17 0751 03/14/17 1216  Weight: 64.9 kg (143 lb) 62 kg (136 lb 11 oz)    History of present illness:   51 yr female with advanced stage IV cervical cancer, being followed at [facility], receiving Chemotherapy q 3 weeks with next regimen to be done 03/23/17, who was admitted yesterday morning with anemia and hypotension due to chronic and acute vaginal bleeding    Hospital Course:   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 Patient is seen evaluated by  Dr. Glo Herring. Bleeding subsided. Recommended to cont f/u with [facility] on 11/30  Acute blood loss anemia with gyn bleeding. symptomatic anemia. Transfused three unit then 2 more unit. H/H is stable. recommended to f/u to repeat Hg in 1-3 days or return if re bleeds  Endo: Thyroid mass, ?toxic multinodular goiter. Workup at [facility] and as per history.  -10/30: TSH 0.5, T3 33, T4 6 (10/30).  Thyroid US 10/31: markedly enlarged heterogenous thyroid with multiple confluent foci. S/p w/u at [facility] in 2013. 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 Recommended surgical resection at that time.  - 11/2 Endo consult: recs CT of neck to look for esophageal compression, outpatient f/u for discussion of resection of thyroid. Speech pathology: swallow evaluation without restrictions - 11/5 PET: mildly hypermetabolic  multinodular goiter  - seen by endocrine 11/6, patient to be evaluated by surg onc although not an optimal time for thyroid removal. Meds and ablation not indicated for hyperthyroidism. Patient is to f/u with [facility] endo and ENT at discharge    Hypotension, improved after transfusion/ ikely due to bleeding and anemia. Resolved     Procedures:  none (i.e. Studies not automatically included, echos, thoracentesis, etc; not x-rays)  Consultations:  GYN  Discharge Exam: Vitals:   03/15/17 2000 03/16/17 0315  BP: 104/66 103/69  Pulse: 90 87  Resp: 20 18  Temp: 98.9 F (37.2 C) 98.9 F (37.2 C)  SpO2: 99% 100%    General: alert. No distress  Cardiovascular: s1,s2 rrr Respiratory: CTA BL  Discharge Instructions  Discharge Instructions    Diet - low sodium heart healthy   Complete by:  As directed    Increase activity slowly   Complete by:  As directed      Allergies as of 03/16/2017   No Known Allergies     Medication List    STOP taking these medications   ibuprofen 600 MG tablet Commonly known as:  ADVIL,MOTRIN     TAKE these medications   acetaminophen 500 MG tablet Commonly known as:  TYLENOL Take 1,000 mg by mouth every 6 (six) hours as needed for mild pain, moderate pain or headache.   calcium acetate 667 MG capsule Commonly known as:  PHOSLO Take 1,334 mg by mouth 3 (three) times daily with meals.   ferrous sulfate 325 (65 FE) MG tablet Take 325 mg by mouth 3 (three) times  daily.   FLUoxetine 10 MG capsule Commonly known as:  PROZAC Take 10 mg by mouth daily.   HYDROcodone-acetaminophen 5-325 MG tablet Commonly known as:  NORCO/VICODIN Take 1 tablet by mouth every 6 (six) hours as needed.   polyethylene glycol packet Commonly known as:  MIRALAX / GLYCOLAX Take 17 g by mouth daily.   senna 8.6 MG tablet Commonly known as:  SENOKOT Take 1 tablet by mouth at bedtime.      No Known Allergies Follow-up Information    [facility] Call.   Why:  in 3 -5 days  Contact information: [address] [place] [address] [phone]            The results of significant diagnostics from this hospitalization (including imaging, microbiology, ancillary and laboratory) are listed below for reference.    Significant Diagnostic Studies: Ct Renal Stone Study  Result Date: 02/19/2017 CLINICAL DATA:  Bilateral flank pain, nausea/vomiting, ureteral stents, history of kidney stones EXAM: CT ABDOMEN AND PELVIS WITHOUT CONTRAST TECHNIQUE: Multidetector CT imaging of the abdomen and pelvis was performed following the standard protocol without IV contrast. COMPARISON:  None. FINDINGS: Lower chest: Lung bases are clear. Hepatobiliary: Unenhanced liver is unremarkable. Gallbladder is unremarkable. No intrahepatic for extrahepatic duct dilatation. Pancreas: Within normal limits. Spleen: Within normal limits. Adrenals/Urinary Tract: Adrenal glands within normal limits. Kidneys are notable for moderate hydroureteronephrosis with indwelling bilateral ureteral stents. No renal, ureteral, or bladder calculi. 2.6 cm posterior urethral diverticulum (series 2/image 78). Bladder is within normal limits. Stomach/Bowel: Stomach is within normal limits. No evidence of bowel obstruction. Normal appendix (series 2/ image 64). Vascular/Lymphatic: No evidence of abdominal aortic aneurysm. Atherosclerotic calcifications of the abdominal aorta and branch vessels. Extensive abdominopelvic lymphadenopathy, much of which is partially calcified, including: --1.8 cm short axis peripancreatic node (series 2/ image 28) --2.8 cm short axis left para- aortic node (series 2/ image 39) --2.6 cm short axis right common iliac node (series 2/ image 46) --1.9 cm short axis node anterior to the L5 vertebral body (series 2/ image 52) --1.8 cm short axis right external iliac node (series 2/image 66) --1.9 cm short axis right external iliac node (series 2/image  67) --1.5 cm short axis left deep inguinal node (series 2/image 67) --1.3 cm short axis left perirectal node (series 2/image 75) Reproductive: 4.5 x 6.1 x 5.0 cm mass centered in the region of the cervix (series 2/image 69; sagittal image 55). Parametrial invasion is suspected, although poorly evaluated on CT. Associated obstructive uropathy, described above. Direct extension into the anterior rectal wall is difficult to exclude (series 2/image 70). Associated fluid trapped within the endometrial cavity (sagittal image 53). Other: No abdominopelvic ascites. Musculoskeletal: Degenerative changes of the visualized thoracolumbar spine. IMPRESSION: 6.1 cm cervical mass, poorly evaluated on unenhanced CT, suspicious for cervical cancer. Associated moderate bilateral hydroureteronephrosis with indwelling bilateral ureteral stents. No renal or ureteral calculi. Direct invasion into the anterior rectum is not excluded. If present, this would make the FIGO staging at least stage IVA. Extensive abdominopelvic lymphadenopathy, many of which are partially calcified. In this clinical setting, nodal metastases are presumed. The presence of suspected nodal metastases outside the true pelvis would reflect stage IVB disease. Electronically Signed   By: Julian Hy M.D.   On: 02/19/2017 13:05   US Thyroid  Result Date: 02/21/2017 CLINICAL DATA:  Asymmetric neck mass EXAM: THYROID ULTRASOUND TECHNIQUE: Ultrasound examination of the thyroid gland and adjacent soft tissues was performed. COMPARISON:  01/29/2012 at [facility], by report only FINDINGS: Parenchymal Echotexture: Markedly heterogenous Isthmus: 1.2 cm thickness, previously 1.3 Right lobe: 7.3 x 4.3 x 7.3 cm, previously 7.9 x 4.6 x 3.5 Left lobe: 7.4 x 3.7 x 7.2 cm, previously 8 x 5.4 x 3.2 _________________________________________________________ Estimated total number of nodules >/= 1 cm: At least 5 Number of spongiform nodules >/=  2 cm not described below (TR1): 0  Number of mixed cystic and solid nodules >/= 1.5 cm not described below ([place]): At least 4 _________________________________________________________ Nodule # 1: Location: Right; Mid Maximum size: 3.6 cm; Other 2 dimensions: 3.4 x 2.7 (previously 3.9 x 3.6 x 1.7 by report) Composition: mixed cystic and solid (1) Echogenicity: hypoechoic (2) Shape: not taller-than-wide (0) Margins: ill-defined (0) Echogenic foci:  Small comet tail artifacts (0) ACR TI-RADS total points: 3. ACR TI-RADS risk category: TR3 (3 points). ACR TI-RADS recommendations: **Given size (>/= 2.5 cm) and appearance, fine needle aspiration of this mildly suspicious nodule should be considered based on TI-RADS criteria. _________________________________________________________ Nodule # 2: Location: Left; Inferior Maximum size: 3.9 cm; Other 2 dimensions: 3.8 x 3. cm (previously 3.4 x 2.5 x 2.9 cm by report) Composition: spongiform (0) Echogenicity: hypoechoic (2) Shape: not taller-than-wide (0) Margins: ill-defined (0) Echogenic foci: large comet-tail artifacts (0) ACR TI-RADS total points: 2. ACR TI-RADS risk category: TR2 (2 points). ACR TI-RADS recommendations: This nodule does NOT meet TI-RADS criteria for biopsy or dedicated follow-up. _________________________________________________________ Multiple additional lobular solid, complex, and cystic regions which are partially confluent. IMPRESSION: 1. Markedly enlarged heterogenous thyroid with multiple confluent nodular foci. 2. Recommend FNA biopsy of mildly suspicious 3.6 cm mid right nodule. The above is in keeping with the ACR TI-RADS recommendations - J Am Coll Radiol 2017;14:587-595. Electronically Signed   By: Lucrezia Europe M.D.   On: 02/21/2017 13:10    Microbiology: Recent Results (from the past 240 hour(s))  MRSA PCR Screening     Status: None   Collection Time: 03/14/17 12:52 PM  Result Value Ref Range Status   MRSA by PCR NEGATIVE NEGATIVE Final    Comment:        The GeneXpert  MRSA Assay (FDA approved for NASAL specimens only), is one component of a comprehensive MRSA colonization surveillance program. It is not intended to diagnose MRSA infection nor to guide or monitor treatment for MRSA infections.      Labs: Basic Metabolic Panel: Recent Labs  Lab 03/14/17 0828 03/14/17 1411 03/15/17 0537  NA 135  --  136  K 3.4*  --  4.0  CL 105  --  109  CO2 21*  --  23  GLUCOSE 132*  --  91  BUN 27*  --  19  CREATININE 2.55*  --  1.74*  CALCIUM 9.2  --  8.4*  MG  --  1.6*  --    Liver Function Tests: Recent Labs  Lab 03/15/17 0537  AST 16  ALT 8*  ALKPHOS 50  BILITOT 0.5  PROT 5.3*  ALBUMIN 2.5*   No results for input(s): LIPASE, AMYLASE in the last 168 hours. No results for input(s): AMMONIA in the last 168 hours. CBC: Recent Labs  Lab 03/14/17 0828 03/15/17 0537 03/16/17 0633  WBC 9.1 7.5 6.2  NEUTROABS 6.6  --   --   HGB 6.8* 8.7* 10.2*  HCT 21.0* 26.7* 31.2*  MCV 88.6 89.9 92.3  PLT 271 180 180   Cardiac Enzymes: Recent Labs  Lab 03/14/17 0828  TROPONINI <0.03  BNP: BNP (last 3 results) No results for input(s): BNP in the last 8760 hours.  ProBNP (last 3 results) No results for input(s): PROBNP in the last 8760 hours.  CBG: No results for input(s): GLUCAP in the last 168 hours.     SignedKinnie Feil  [facility] 03/16/2017, 9:13 AM  addendum  CKD Stage III - GFR 30-59  Mazelle Huebert N

## 2017-03-16 NOTE — Care Management Note (Signed)
Case Management Note  Patient Details  Name: Tanya Wright MRN: [id number] Date of Birth: 10-Dec-1965  Subjective/Objective:      Admitted with anemia. Pt seen during previous stay. Chart reviewed for CM needs. Pt from home, lives with mother. She is ind with ADL's. She is undergoing chemo. She has PCP office and is being followed by oncology. She has no insurance but receives assistance with medications through PCP office. Only Rx at DC is Ibuprofen.               Action/Plan: DC home today with self care. No CM needs noted.   Expected Discharge Date:  03/16/17               Expected Discharge Plan:  Home/Self Care  In-House Referral:  NA  Discharge planning Services  NA  Post Acute Care Choice:  NA Choice offered to:  NA  Status of Service:  Completed, signed off  Sherald Barge, RN 03/16/2017, 10:01 AM

## 2017-04-16 ENCOUNTER — Emergency Department (HOSPITAL_COMMUNITY): Payer: Medicaid Other

## 2017-04-16 ENCOUNTER — Encounter (HOSPITAL_COMMUNITY): Payer: Self-pay | Admitting: Emergency Medicine

## 2017-04-16 ENCOUNTER — Observation Stay (HOSPITAL_COMMUNITY)
Admission: EM | Admit: 2017-04-16 | Discharge: 2017-04-19 | Disposition: A | Discharge status: Home / Self Care | Payer: Medicaid Other | Attending: Family Medicine | Admitting: Family Medicine

## 2017-04-16 ENCOUNTER — Other Ambulatory Visit: Payer: Self-pay

## 2017-04-16 DIAGNOSIS — R0789 Other chest pain: Principal | ICD-10-CM | POA: Insufficient documentation

## 2017-04-16 DIAGNOSIS — E440 Moderate protein-calorie malnutrition: Secondary | ICD-10-CM | POA: Diagnosis not present

## 2017-04-16 DIAGNOSIS — E0789 Other specified disorders of thyroid: Secondary | ICD-10-CM | POA: Diagnosis not present

## 2017-04-16 DIAGNOSIS — F17210 Nicotine dependence, cigarettes, uncomplicated: Secondary | ICD-10-CM | POA: Insufficient documentation

## 2017-04-16 DIAGNOSIS — R109 Unspecified abdominal pain: Secondary | ICD-10-CM | POA: Diagnosis not present

## 2017-04-16 DIAGNOSIS — R0600 Dyspnea, unspecified: Secondary | ICD-10-CM | POA: Diagnosis not present

## 2017-04-16 DIAGNOSIS — C539 Malignant neoplasm of cervix uteri, unspecified: Secondary | ICD-10-CM | POA: Insufficient documentation

## 2017-04-16 DIAGNOSIS — I129 Hypertensive chronic kidney disease with stage 1 through stage 4 chronic kidney disease, or unspecified chronic kidney disease: Secondary | ICD-10-CM | POA: Diagnosis not present

## 2017-04-16 DIAGNOSIS — N179 Acute kidney failure, unspecified: Secondary | ICD-10-CM | POA: Diagnosis not present

## 2017-04-16 DIAGNOSIS — N183 Chronic kidney disease, stage 3 unspecified: Secondary | ICD-10-CM | POA: Diagnosis present

## 2017-04-16 DIAGNOSIS — Z79899 Other long term (current) drug therapy: Secondary | ICD-10-CM | POA: Diagnosis not present

## 2017-04-16 DIAGNOSIS — N189 Chronic kidney disease, unspecified: Secondary | ICD-10-CM | POA: Diagnosis not present

## 2017-04-16 DIAGNOSIS — C799 Secondary malignant neoplasm of unspecified site: Secondary | ICD-10-CM | POA: Insufficient documentation

## 2017-04-16 DIAGNOSIS — N289 Disorder of kidney and ureter, unspecified: Secondary | ICD-10-CM

## 2017-04-16 DIAGNOSIS — R1909 Other intra-abdominal and pelvic swelling, mass and lump: Secondary | ICD-10-CM

## 2017-04-16 LAB — CBC
HCT: 34.1 % — ABNORMAL LOW (ref 36.0–46.0)
Hemoglobin: 10.9 g/dL — ABNORMAL LOW (ref 12.0–15.0)
MCH: 29.5 pg (ref 26.0–34.0)
MCHC: 32 g/dL (ref 30.0–36.0)
MCV: 92.4 fL (ref 78.0–100.0)
PLATELETS: 359 10*3/uL (ref 150–400)
RBC: 3.69 MIL/uL — AB (ref 3.87–5.11)
RDW: 14.6 % (ref 11.5–15.5)
WBC: 5.6 10*3/uL (ref 4.0–10.5)

## 2017-04-16 LAB — HEPATIC FUNCTION PANEL
ALT: 9 U/L — ABNORMAL LOW (ref 14–54)
AST: 17 U/L (ref 15–41)
Albumin: 3.3 g/dL — ABNORMAL LOW (ref 3.5–5.0)
Alkaline Phosphatase: 64 U/L (ref 38–126)
BILIRUBIN DIRECT: 0.1 mg/dL (ref 0.1–0.5)
BILIRUBIN INDIRECT: 0.4 mg/dL (ref 0.3–0.9)
BILIRUBIN TOTAL: 0.5 mg/dL (ref 0.3–1.2)
Total Protein: 7.5 g/dL (ref 6.5–8.1)

## 2017-04-16 LAB — BASIC METABOLIC PANEL
Anion gap: 11 (ref 5–15)
BUN: 20 mg/dL (ref 6–20)
CALCIUM: 9.6 mg/dL (ref 8.9–10.3)
CO2: 24 mmol/L (ref 22–32)
CREATININE: 2.45 mg/dL — AB (ref 0.44–1.00)
Chloride: 103 mmol/L (ref 101–111)
GFR calc non Af Amer: 22 mL/min — ABNORMAL LOW (ref >60)
GFR, EST AFRICAN AMERICAN: 25 mL/min — AB (ref >60)
Glucose, Bld: 106 mg/dL — ABNORMAL HIGH (ref 65–99)
Potassium: 3.9 mmol/L (ref 3.5–5.1)
Sodium: 138 mmol/L (ref 135–145)

## 2017-04-16 LAB — TROPONIN I

## 2017-04-16 LAB — LIPASE, BLOOD: LIPASE: 59 U/L — AB (ref 11–51)

## 2017-04-16 LAB — BRAIN NATRIURETIC PEPTIDE: B Natriuretic Peptide: 20 pg/mL (ref 0.0–100.0)

## 2017-04-16 MED ORDER — CALCIUM ACETATE (PHOS BINDER) 667 MG PO CAPS
1334.0000 mg | ORAL_CAPSULE | Freq: Three times a day (TID) | ORAL | Status: DC
  Administered 2017-04-17 – 2017-04-19 (×8): 1334 mg via ORAL
  Filled 2017-04-16 (×9): qty 2

## 2017-04-16 MED ORDER — TECHNETIUM TC 99M DIETHYLENETRIAME-PENTAACETIC ACID
30.0000 | Freq: Once | INTRAVENOUS | Status: AC | PRN
  Administered 2017-04-16: 32 via RESPIRATORY_TRACT

## 2017-04-16 MED ORDER — ACETAMINOPHEN 500 MG PO TABS
1000.0000 mg | ORAL_TABLET | Freq: Three times a day (TID) | ORAL | Status: DC | PRN
  Administered 2017-04-16 – 2017-04-17 (×2): 1000 mg via ORAL
  Filled 2017-04-16 (×2): qty 2

## 2017-04-16 MED ORDER — OXYCODONE HCL 5 MG PO TABS
10.0000 mg | ORAL_TABLET | ORAL | Status: DC | PRN
  Administered 2017-04-16 – 2017-04-19 (×7): 10 mg via ORAL
  Filled 2017-04-16 (×7): qty 2

## 2017-04-16 MED ORDER — SODIUM CHLORIDE 0.9 % IV BOLUS (SEPSIS)
1000.0000 mL | Freq: Once | INTRAVENOUS | Status: AC
  Administered 2017-04-16: 1000 mL via INTRAVENOUS

## 2017-04-16 MED ORDER — SODIUM CHLORIDE 0.9 % IV BOLUS (SEPSIS)
500.0000 mL | Freq: Once | INTRAVENOUS | Status: AC
  Administered 2017-04-16: 500 mL via INTRAVENOUS

## 2017-04-16 MED ORDER — NITROGLYCERIN 0.4 MG SL SUBL
0.4000 mg | SUBLINGUAL_TABLET | SUBLINGUAL | Status: DC | PRN
  Administered 2017-04-16 (×2): 0.4 mg via SUBLINGUAL
  Filled 2017-04-16: qty 1

## 2017-04-16 MED ORDER — IOPAMIDOL (ISOVUE-300) INJECTION 61%
INTRAVENOUS | Status: AC
  Filled 2017-04-16: qty 30

## 2017-04-16 MED ORDER — TECHNETIUM TO 99M ALBUMIN AGGREGATED: 4.0000 | Freq: Once | INTRAVENOUS | Status: DC | PRN

## 2017-04-16 MED ORDER — SENNA 8.6 MG PO TABS
1.0000 | ORAL_TABLET | Freq: Every day | ORAL | Status: DC
  Administered 2017-04-16 – 2017-04-18 (×3): 8.6 mg via ORAL
  Filled 2017-04-16 (×3): qty 1

## 2017-04-16 MED ORDER — HYDROMORPHONE HCL 1 MG/ML IJ SOLN
0.5000 mg | INTRAMUSCULAR | Status: DC | PRN
  Administered 2017-04-16 – 2017-04-17 (×2): 0.5 mg via INTRAVENOUS
  Filled 2017-04-16 (×2): qty 1

## 2017-04-16 MED ORDER — HYDROMORPHONE HCL 1 MG/ML IJ SOLN
1.0000 mg | Freq: Once | INTRAMUSCULAR | Status: AC
  Administered 2017-04-16: 1 mg via INTRAVENOUS
  Filled 2017-04-16: qty 1

## 2017-04-16 MED ORDER — POLYETHYLENE GLYCOL 3350 17 G PO PACK
17.0000 g | PACK | Freq: Every day | ORAL | Status: DC
  Filled 2017-04-16: qty 1

## 2017-04-16 MED ORDER — POLYETHYLENE GLYCOL 3350 17 G PO PACK
17.0000 g | PACK | Freq: Two times a day (BID) | ORAL | Status: DC
  Administered 2017-04-16 – 2017-04-18 (×3): 17 g via ORAL
  Filled 2017-04-16 (×5): qty 1

## 2017-04-16 MED ORDER — FLUOXETINE HCL 10 MG PO CAPS
10.0000 mg | ORAL_CAPSULE | Freq: Every day | ORAL | Status: DC
  Administered 2017-04-16 – 2017-04-19 (×4): 10 mg via ORAL
  Filled 2017-04-16 (×4): qty 1

## 2017-04-16 MED ORDER — ASPIRIN 81 MG PO CHEW
324.0000 mg | CHEWABLE_TABLET | Freq: Once | ORAL | Status: AC
  Administered 2017-04-16: 324 mg via ORAL
  Filled 2017-04-16: qty 4

## 2017-04-16 MED ORDER — SODIUM CHLORIDE 0.9 % IV SOLN
INTRAVENOUS | Status: DC
  Administered 2017-04-16 – 2017-04-19 (×8): via INTRAVENOUS

## 2017-04-16 MED ORDER — FERROUS SULFATE 325 (65 FE) MG PO TABS
325.0000 mg | ORAL_TABLET | Freq: Three times a day (TID) | ORAL | Status: DC
  Administered 2017-04-16 – 2017-04-19 (×8): 325 mg via ORAL
  Filled 2017-04-16 (×8): qty 1

## 2017-04-16 NOTE — ED Triage Notes (Signed)
Pt reports chest pain that started this morning. Pt reports increased pain with movement and deep breath. Pt reports is currently undergoing chemo for cervical cancer.

## 2017-04-16 NOTE — H&P (Signed)
History and Physical    Tanya Wright URK:[id number] DOB: 07-27-1965 DOA: 04/16/2017  PCP: [facility]  Patient coming from: home  I have personally briefly reviewed patient's old medical records in [place]  Chief Complaint: headache, chest pain  HPI: Tanya Wright is a 51 y.o. female with medical history significant of stage IV cervical cancer on chemo, HTN,  CKD, bilateral ureteral stents, thyroid mass here with headache, chest pain, and shortness of breath that began this morning.      She notes that her symptoms started with chest pain and headache this morning when she woke up.  She notes that the chest pain is constant and stabbing.  Nothing makes it better or worse.  She does not have any chest pain with exertion.  Her headache is frontal.  No vision changes, no light sensitivity, no numbness, tingling, weakness.  She denies any fevers, cough, abdominal pain, nausea, vomiting, or diarrhea.  She notes shortness of breath that is been constant since this morning as well.  Her last chemotherapy was last Thursday.  Of note, she had abdominal TTP on exam which she thinks is due to constipation (though she denies abdominal pain when asked).  ED Course: In the emergency department she had a VQ scan, labs, admit for acute kidney injury and chest pain and shortness of breath.  Review of Systems: As per HPI otherwise 10 point review of systems negative.   Past Medical History:  Diagnosis Date  . Hypertension   . Renal disorder   . Thyroid disease     Past Surgical History:  Procedure Laterality Date  . STENT PLACEMENT RT URETER ([place] HX)     sent in both kidneys for stones  . TUBAL LIGATION       reports that she has been smoking cigarettes.  she has never used smokeless tobacco. She reports that she drinks alcohol. She reports that she uses drugs. Drug: Marijuana.  No Known Allergies  History reviewed. No pertinent family history.  Prior to  Admission medications   Medication Sig Start Date End Date Taking? Authorizing Provider  acetaminophen (TYLENOL) 500 MG tablet Take 1,000 mg by mouth every 6 (six) hours as needed for mild pain, moderate pain or headache.   Yes [provider]  calcium acetate (PHOSLO) 667 MG capsule Take 1,334 mg by mouth 3 (three) times daily with meals.   Yes [provider]  ferrous sulfate 325 (65 FE) MG tablet Take 325 mg by mouth 3 (three) times daily.   Yes [provider]  FLUoxetine (PROZAC) 10 MG capsule Take 10 mg by mouth daily. 03/02/17 04/16/18 Yes [provider]  Oxycodone HCl 10 MG TABS Take 10 mg by mouth every 4 (four) hours as needed (pain).   Yes [provider]  polyethylene glycol (MIRALAX / GLYCOLAX) packet Take 17 g by mouth daily. 02/22/17  Yes [name], MD  senna (SENOKOT) 8.6 MG tablet Take 1 tablet by mouth at bedtime.   Yes [provider]    Physical Exam: Vitals:   04/16/17 1500 04/16/17 1640 04/16/17 1700 04/16/17 1730  BP:  114/90 (!) 119/93 123/83  Pulse: 88 (!) 103 84 91  Resp: 15 19 (!) 21 20  Temp:      TempSrc:      SpO2: 100% 99% 100% 100%  Weight:      Height:        Constitutional: NAD, calm, comfortable Vitals:  04/16/17 1500 04/16/17 1640 04/16/17 1700 04/16/17 1730  BP:  114/90 (!) 119/93 123/83  Pulse: 88 (!) 103 84 91  Resp: 15 19 (!) 21 20  Temp:      TempSrc:      SpO2: 100% 99% 100% 100%  Weight:      Height:       Eyes: PERRL, lids and conjunctivae normal ENMT: Mucous membranes are moist. Posterior pharynx clear of any exudate or lesions.Normal dentition.  Neck: normal, supple, no masses, no thyromegaly Respiratory: clear to auscultation bilaterally, no wheezing, no crackles. Normal respiratory effort. No accessory muscle use.  Cardiovascular: Regular rate and rhythm, no murmurs / rubs / gallops. No extremity edema. 2+ pedal pulses. No carotid bruits.  CP not reproducible with  palpation.  Abdomen: diffuse abdominal tenderness, no masses palpated. No hepatosplenomegaly.  Musculoskeletal: no clubbing / cyanosis. No joint deformity upper and lower extremities. Good ROM, no contractures. Normal muscle tone.  Skin: no rashes, lesions, ulcers. No induration Neurologic: CN 2-12 grossly intact. Sensation intact. Strength 5/5 in all 4.  Psychiatric: Normal judgment and insight. Alert and oriented x 3. Normal mood.   Labs on Admission: I have personally reviewed following labs and imaging studies  CBC: Recent Labs  Lab 04/16/17 1140  WBC 5.6  HGB 10.9*  HCT 34.1*  MCV 92.4  PLT 595   Basic Metabolic Panel: Recent Labs  Lab 04/16/17 1140  NA 138  K 3.9  CL 103  CO2 24  GLUCOSE 106*  BUN 20  CREATININE 2.45*  CALCIUM 9.6   GFR: Estimated Creatinine Clearance: 26 mL/min (A) (by C-G formula based on SCr of 2.45 mg/dL (H)). Liver Function Tests: Recent Labs  Lab 04/16/17 1205  AST 17  ALT 9*  ALKPHOS 64  BILITOT 0.5  PROT 7.5  ALBUMIN 3.3*   Recent Labs  Lab 04/16/17 1205  LIPASE 59*   No results for input(s): AMMONIA in the last 168 hours. Coagulation Profile: No results for input(s): INR, PROTIME in the last 168 hours. Cardiac Enzymes: Recent Labs  Lab 04/16/17 1140  TROPONINI <0.03   BNP (last 3 results) No results for input(s): PROBNP in the last 8760 hours. HbA1C: No results for input(s): HGBA1C in the last 72 hours. CBG: No results for input(s): GLUCAP in the last 168 hours. Lipid Profile: No results for input(s): CHOL, HDL, LDLCALC, TRIG, CHOLHDL, LDLDIRECT in the last 72 hours. Thyroid Function Tests: No results for input(s): TSH, T4TOTAL, FREET4, T3FREE, THYROIDAB in the last 72 hours. Anemia Panel: No results for input(s): VITAMINB12, FOLATE, FERRITIN, TIBC, IRON, RETICCTPCT in the last 72 hours. Urine analysis:    Component Value Date/Time   COLORURINE STRAW (A) 02/19/2017 1001   APPEARANCEUR HAZY (A) 02/19/2017 1001     LABSPEC 1.010 02/19/2017 1001   PHURINE 7.0 02/19/2017 1001   GLUCOSEU NEGATIVE 02/19/2017 1001   HGBUR MODERATE (A) 02/19/2017 1001   BILIRUBINUR NEGATIVE 02/19/2017 1001   KETONESUR NEGATIVE 02/19/2017 1001   PROTEINUR 30 (A) 02/19/2017 1001   NITRITE NEGATIVE 02/19/2017 1001   LEUKOCYTESUR LARGE (A) 02/19/2017 1001    Radiological Exams on Admission: Ct Abdomen Pelvis Wo Contrast  Result Date: 04/16/2017 CLINICAL DATA:  Mid abdominal pain and current treatment for cervical carcinoma. EXAM: CT ABDOMEN AND PELVIS WITHOUT CONTRAST TECHNIQUE: Multidetector CT imaging of the abdomen and pelvis was performed following the standard protocol without IV contrast. COMPARISON:  None. FINDINGS: Lower chest: No acute abnormality. Hepatobiliary: No focal liver abnormality is seen.  No gallstones, gallbladder wall thickening, or biliary dilatation. Pancreas: Unremarkable. No pancreatic ductal dilatation or surrounding inflammatory changes. Spleen: Normal in size without focal abnormality. Adrenals/Urinary Tract: No adrenal masses identified. Bilateral ureteral stents present. Each extends from the level of the distal renal pelvis down into the bladder. Bilateral renal collecting systems show minimal prominence without evidence of significant hydronephrosis. No renal calculi or visualized renal masses by unenhanced CT. Stomach/Bowel: Bowel shows no evidence of obstruction or inflammation. No focal bowel wall thickening, pneumatosis or free air identified. Vascular/Lymphatic: No vascular abnormalities by unenhanced CT. There are a number of calcified retroperitoneal lymph nodes primarily in the periaortic and aortocaval regions but also in the iliac chains bilaterally. Some of these are heavily calcified and some partially calcified. The largest heavily calcified lymph node in the proximal right common iliac region measures 1.8 cm. There are some partially calcified lymph nodes also in the gastrohepatic region  and celiac region. Calcifications likely reflect treated lymph nodes. Comment on treatment response cannot be made has no prior imaging appears to be available for comparison. Reproductive: There is some air in the endometrial cavity and vagina. No adnexal masses. Other: No hernias identified. No focal abscess or free fluid identified. No evidence of obvious carcinomatosis. Musculoskeletal: Degenerate disc disease with vacuum disc at L3-4 and L5-S1. IMPRESSION: 1. Multitude of partially calcified and heavily calcified lymph nodes throughout the retroperitoneal space and also in the gastro patent and celiac region. The highest number of calcified nodes are in the para-aortic retroperitoneum and proximal iliac chain. These are all consistent with treated lymph nodes. No prior imaging is available to determine stability or response to treatment. 2. Bilateral ureteral stents present. The stents appear well positioned. The renal collecting systems are mildly prominent without significant hydronephrosis present. 3. No evidence of acute process involving bowel. Electronically Signed   By: Aletta Edouard M.D.   On: 04/16/2017 14:13   Dg Chest 2 View  Result Date: 04/16/2017 CLINICAL DATA:  Chest pain EXAM: CHEST  2 VIEW COMPARISON:  None. FINDINGS: Right Port-A-Cath tip in the SVC. Heart is normal size. Lungs are clear. No effusions or acute bony abnormality. IMPRESSION: No active cardiopulmonary disease. Electronically Signed   By: Rolm Baptise M.D.   On: 04/16/2017 11:37   Nm Pulmonary Vent And Perf (v/q Scan)  Result Date: 04/16/2017 CLINICAL DATA:  Acute onset of chest pain and shortness of breath that began this morning. Current history of chronic kidney disease which precluded performance of a a CTA chest. EXAM: NUCLEAR MEDICINE VENTILATION - PERFUSION LUNG SCAN TECHNIQUE: Ventilation images were obtained in multiple projections using inhaled aerosol Tc-61m DTPA. Perfusion images were obtained in multiple  projections after intravenous injection of Tc-10m MAA. RADIOPHARMACEUTICALS:  32 mCi Technetium-37m DTPA aerosol inhalation and 4.4 mCi Technetium-62m MAA IV COMPARISON:  Two-view chest x-ray obtained earlier same day. No prior chest imaging otherwise. FINDINGS: Ventilation: No focal ventilation defect. Activity within the esophagus due to ingested aerosol. Perfusion: No wedge shaped peripheral perfusion defects to suggest acute pulmonary embolism. IMPRESSION: Normal examination. Electronically Signed   By: Evangeline Dakin M.D.   On: 04/16/2017 16:12    EKG: Independently reviewed. Appears similar to prior EKG's, normal sinus rhythm.    Assessment/Plan Active Problems:   AKI (acute kidney injury) ([place])  Chest Pain  Dyspnea:  Atypical in description.  Improving (seemed most improved after dilaudid, maybe a little after nitro).  No CP with exertion.  SOB started this morning as well, describes  as constant.  Unable to describe much more than this.  Had VQ scan in ED which was normal.  CXR negative.  Satting normally.  Initially tachy, now improved.  Unclear cause of her multitude of sx which started this morning, maybe related to recent chemo?   Given ASA.  Nitro prn. Repeat EKG in AM Trend troponins Will get echo as well BNP Continue to monitor  Headache:  Improved.  Frontal, suspect tension.  Continue APAP prn.  Migraine cocktail if needed.  Abdominal Pain:  She denied this to me, but was tender on exam.  CT was not notable for acute process involving bowel.  Possibly related to cancer.  Lipase mildly elevated, but no CT evidence of pancreatitis.  She notes she feels constipated. UA pending Soap sud enema, miralax BID, senna  Acute Kidney Injury on CKD  Bilateral Ureteral Stents  Dehydration:  Baseline Cr around 1.7-1.8.  Suspect due to dehydration.  Will continue IVF, repeat AM labs.  She has bilateral ureteral stents.  No significant hydro seen on CT, though "mildly prominent renal  collecting systems".   Cervical Cancer:  S/p tax/carboplatin 04/12/2017.  Follows at [facility].  Continue oxycodone for cancer related pain Has dilaudid ordered as well, use this for pain not resolved with PO pain meds  Thyroid Mass: f/u with [facility] providers  DVT prophylaxis: SCD's  Code Status: full code  Family Communication: none at bedside  Disposition Plan: pending  Consults called: none  Admission status: obs    Fayrene Helper MD [facility] Pager [phone]  If 7PM-7AM, please contact night-coverage [email or web address] Password [facility]  04/16/2017, 5:58 PM

## 2017-04-16 NOTE — ED Provider Notes (Signed)
[facility] EMERGENCY DEPARTMENT Provider Note   CSN: [id number] Arrival date & time: 04/16/17  1110     History   Chief Complaint Chief Complaint  Patient presents with  . Chest Pain    HPI Tanya Wright is a 51 y.o. female.  HPI Patient is on chemotherapy for metastatic cervical cancer.  States that she feels bad all over.  Pain in her chest and abdomen.  Pain is worse with movements.  Has had constipation, which is not unusual for her.  Last chemotherapy was on Thursday, with today being Monday.  The pain is sharp in her mid chest.  Worse with breathing.  Also states abdomen hurts.  No dysuria.  Has had anemia due to bleeding from her cervical cancer. Past Medical History:  Diagnosis Date  . Hypertension   . Renal disorder   . Thyroid disease     Patient Active Problem List   Diagnosis Date Noted  . Anemia in other chronic diseases classified elsewhere 03/14/2017  . Anemia 03/14/2017  . CKD (chronic kidney disease) 02/20/2017  . UTI (urinary tract infection) 02/20/2017  . 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 Thyroid mass of unclear etiology 02/19/2017  . Cancer determined by uterine cervix biopsy ([place]) 02/19/2017    Past Surgical History:  Procedure Laterality Date  . STENT PLACEMENT RT URETER ([place] HX)     sent in both kidneys for stones  . TUBAL LIGATION      OB History    No data available       Home Medications    Prior to Admission medications   Medication Sig Start Date End Date Taking? Authorizing Provider  acetaminophen (TYLENOL) 500 MG tablet Take 1,000 mg by mouth every 6 (six) hours as needed for mild pain, moderate pain or headache.   Yes [provider]  calcium acetate (PHOSLO) 667 MG capsule Take 1,334 mg by mouth 3 (three) times daily with meals.   Yes [provider]  ferrous sulfate 325 (65 FE) MG tablet Take 325 mg by mouth 3 (three) times daily.   Yes [provider]  FLUoxetine (PROZAC) 10 MG capsule  Take 10 mg by mouth daily. 03/02/17 04/16/18 Yes [provider]  Oxycodone HCl 10 MG TABS Take 10 mg by mouth every 4 (four) hours as needed (pain).   Yes [provider]  polyethylene glycol (MIRALAX / GLYCOLAX) packet Take 17 g by mouth daily. 02/22/17  Yes [name], MD  senna (SENOKOT) 8.6 MG tablet Take 1 tablet by mouth at bedtime.   Yes [provider]    Family History History reviewed. No pertinent family history.  Social History Social History   Tobacco Use  . Smoking status: Current Some Day Smoker    Types: Cigarettes  . Smokeless tobacco: Never Used  Substance Use Topics  . Alcohol use: Yes  . Drug use: Yes    Types: Marijuana     Allergies   Patient has no known allergies.   Review of Systems Review of Systems  Constitutional: Positive for appetite change, chills and fatigue.  HENT: Negative for congestion.   Respiratory: Positive for cough and shortness of breath.   Cardiovascular: Positive for chest pain.  Gastrointestinal: Positive for abdominal pain and constipation.  Endocrine: Negative for polyuria.  Genitourinary: Negative for dysuria and flank pain.  Musculoskeletal: Positive for myalgias.  Skin: Negative for rash.  Neurological: Positive for weakness.  Hematological: Negative for adenopathy.  Psychiatric/Behavioral: Negative for confusion.  Physical Exam Updated Vital Signs BP 116/89 (BP Location: Left Arm)   Pulse (!) 105   Temp 98.3 F (36.8 C) (Oral)   Resp 18   Ht 5\' 9"  (1.753 m)   Wt 61.2 kg (135 lb)   SpO2 100%   BMI 19.94 kg/m   Physical Exam  Constitutional: She appears well-developed.  HENT:  Head: Normocephalic.  Neck: Neck supple. Thyromegaly present.  Cardiovascular:  No murmur heard. Mild tachycardia  Pulmonary/Chest: She has no wheezes. She has no rales.  Some tenderness on anterior mid chest wall.  Abdominal: There is tenderness.  Moderate tenderness on upper abdomen.  Mild  distention.  No hernia palpated.  Musculoskeletal:       Right lower leg: She exhibits no edema.       Left lower leg: She exhibits no edema.  Mild tenderness to Bilateral Lower Extremities.  Neurological: She is alert.  Skin: Skin is warm. Capillary refill takes less than 2 seconds.  Psychiatric: She has a normal mood and affect.     ED Treatments / Results  Labs (all labs ordered are listed, but only abnormal results are displayed) Labs Reviewed  BASIC METABOLIC PANEL - Abnormal; Notable for the following components:      Result Value   Glucose, Bld 106 (*)    Creatinine, Ser 2.45 (*)    GFR calc non Af Amer 22 (*)    GFR calc Af Amer 25 (*)    All other components within normal limits  CBC - Abnormal; Notable for the following components:   RBC 3.69 (*)    Hemoglobin 10.9 (*)    HCT 34.1 (*)    All other components within normal limits  HEPATIC FUNCTION PANEL - Abnormal; Notable for the following components:   Albumin 3.3 (*)    ALT 9 (*)    All other components within normal limits  LIPASE, BLOOD - Abnormal; Notable for the following components:   Lipase 59 (*)    All other components within normal limits  TROPONIN I  URINALYSIS, ROUTINE W REFLEX MICROSCOPIC    EKG  EKG Interpretation  Date/Time:  Monday April 16 2017 11:16:56 EST Ventricular Rate:  99 PR Interval:  132 QRS Duration: 88 QT Interval:  360 QTC Calculation: 462 R Axis:   84 Text Interpretation:  Normal sinus rhythm Moderate voltage criteria for LVH, may be normal variant Borderline ECG Confirmed by Davonna Belling [id number]) on 04/16/2017 11:58:19 AM       Radiology Ct Abdomen Pelvis Wo Contrast  Result Date: 04/16/2017 CLINICAL DATA:  Mid abdominal pain and current treatment for cervical carcinoma. EXAM: CT ABDOMEN AND PELVIS WITHOUT CONTRAST TECHNIQUE: Multidetector CT imaging of the abdomen and pelvis was performed following the standard protocol without IV contrast. COMPARISON:  None.  FINDINGS: Lower chest: No acute abnormality. Hepatobiliary: No focal liver abnormality is seen. No gallstones, gallbladder wall thickening, or biliary dilatation. Pancreas: Unremarkable. No pancreatic ductal dilatation or surrounding inflammatory changes. Spleen: Normal in size without focal abnormality. Adrenals/Urinary Tract: No adrenal masses identified. Bilateral ureteral stents present. Each extends from the level of the distal renal pelvis down into the bladder. Bilateral renal collecting systems show minimal prominence without evidence of significant hydronephrosis. No renal calculi or visualized renal masses by unenhanced CT. Stomach/Bowel: Bowel shows no evidence of obstruction or inflammation. No focal bowel wall thickening, pneumatosis or free air identified. Vascular/Lymphatic: No vascular abnormalities by unenhanced CT. There are a number of calcified retroperitoneal lymph nodes primarily  in the periaortic and aortocaval regions but also in the iliac chains bilaterally. Some of these are heavily calcified and some partially calcified. The largest heavily calcified lymph node in the proximal right common iliac region measures 1.8 cm. There are some partially calcified lymph nodes also in the gastrohepatic region and celiac region. Calcifications likely reflect treated lymph nodes. Comment on treatment response cannot be made has no prior imaging appears to be available for comparison. Reproductive: There is some air in the endometrial cavity and vagina. No adnexal masses. Other: No hernias identified. No focal abscess or free fluid identified. No evidence of obvious carcinomatosis. Musculoskeletal: Degenerate disc disease with vacuum disc at L3-4 and L5-S1. IMPRESSION: 1. Multitude of partially calcified and heavily calcified lymph nodes throughout the retroperitoneal space and also in the gastro patent and celiac region. The highest number of calcified nodes are in the para-aortic retroperitoneum and  proximal iliac chain. These are all consistent with treated lymph nodes. No prior imaging is available to determine stability or response to treatment. 2. Bilateral ureteral stents present. The stents appear well positioned. The renal collecting systems are mildly prominent without significant hydronephrosis present. 3. No evidence of acute process involving bowel. Electronically Signed   By: Aletta Edouard M.D.   On: 04/16/2017 14:13   Dg Chest 2 View  Result Date: 04/16/2017 CLINICAL DATA:  Chest pain EXAM: CHEST  2 VIEW COMPARISON:  None. FINDINGS: Right Port-A-Cath tip in the SVC. Heart is normal size. Lungs are clear. No effusions or acute bony abnormality. IMPRESSION: No active cardiopulmonary disease. Electronically Signed   By: Rolm Baptise M.D.   On: 04/16/2017 11:37    Procedures Procedures (including critical care time)  Medications Ordered in ED Medications  iopamidol (ISOVUE-300) 61 % injection (not administered)  sodium chloride 0.9 % bolus 1,000 mL (1,000 mLs Intravenous New Bag/Given 04/16/17 1406)  sodium chloride 0.9 % bolus 500 mL (0 mLs Intravenous Stopped 04/16/17 1327)  HYDROmorphone (DILAUDID) injection 1 mg (1 mg Intravenous Given 04/16/17 1220)     Initial Impression / Assessment and Plan / ED Course  I have reviewed the triage vital signs and the nursing notes.  Pertinent labs & imaging results that were available during my care of the patient were reviewed by me and considered in my medical decision making (see chart for details).     Patient presents with shortness of breath and myalgias.  Chest pain is worse with movement.  Recently on chemotherapy.  Creatinine has increased to 2.5.  CT abdomen done due to tenderness and was overall reassuring.  Patient's creatinine elevation precludes the CT angiography and I believe she is too high risk for a d-dimer.  Will get VQ scan but with the worsening kidney function believe she would benefit from admission.  Has had  previous bleeding from her tumors that required transfusion so if she is positive for pulmonary embolism she may not be an ideal candidate for anticoagulation.  Will admit to hospitalist.  Final Clinical Impressions(s) / ED Diagnoses   Final diagnoses:  Renal insufficiency  Metastatic cancer ([place])  Dyspnea, unspecified type    ED Discharge Orders    None       Davonna Belling, MD 04/16/17 1422

## 2017-04-17 ENCOUNTER — Observation Stay (HOSPITAL_BASED_OUTPATIENT_CLINIC_OR_DEPARTMENT_OTHER): Payer: Medicaid Other

## 2017-04-17 DIAGNOSIS — R079 Chest pain, unspecified: Secondary | ICD-10-CM

## 2017-04-17 DIAGNOSIS — N179 Acute kidney failure, unspecified: Secondary | ICD-10-CM | POA: Diagnosis not present

## 2017-04-17 DIAGNOSIS — C539 Malignant neoplasm of cervix uteri, unspecified: Secondary | ICD-10-CM | POA: Diagnosis not present

## 2017-04-17 DIAGNOSIS — R0600 Dyspnea, unspecified: Secondary | ICD-10-CM | POA: Diagnosis not present

## 2017-04-17 DIAGNOSIS — R0789 Other chest pain: Secondary | ICD-10-CM | POA: Diagnosis not present

## 2017-04-17 DIAGNOSIS — R109 Unspecified abdominal pain: Secondary | ICD-10-CM | POA: Diagnosis not present

## 2017-04-17 DIAGNOSIS — C799 Secondary malignant neoplasm of unspecified site: Secondary | ICD-10-CM | POA: Diagnosis not present

## 2017-04-17 LAB — COMPREHENSIVE METABOLIC PANEL
ALBUMIN: 2.7 g/dL — AB (ref 3.5–5.0)
ALK PHOS: 54 U/L (ref 38–126)
ALT: 9 U/L — ABNORMAL LOW (ref 14–54)
AST: 15 U/L (ref 15–41)
Anion gap: 7 (ref 5–15)
BILIRUBIN TOTAL: 0.3 mg/dL (ref 0.3–1.2)
BUN: 18 mg/dL (ref 6–20)
CALCIUM: 8.8 mg/dL — AB (ref 8.9–10.3)
CO2: 21 mmol/L — ABNORMAL LOW (ref 22–32)
CREATININE: 1.94 mg/dL — AB (ref 0.44–1.00)
Chloride: 109 mmol/L (ref 101–111)
GFR calc Af Amer: 33 mL/min — ABNORMAL LOW (ref >60)
GFR calc non Af Amer: 29 mL/min — ABNORMAL LOW (ref >60)
GLUCOSE: 91 mg/dL (ref 65–99)
Potassium: 4.4 mmol/L (ref 3.5–5.1)
Sodium: 137 mmol/L (ref 135–145)
TOTAL PROTEIN: 6 g/dL — AB (ref 6.5–8.1)

## 2017-04-17 LAB — ECHOCARDIOGRAM COMPLETE
CHL CUP RV SYS PRESS: 23 mmHg
EERAT: 7.7
EWDT: 373 ms
FS: 31 % (ref 28–44)
HEIGHTINCHES: 69 in
IVS/LV PW RATIO, ED: 0.97
LA ID, A-P, ES: 37 mm
LA diam end sys: 37 mm
LA diam index: 2.15 cm/m2
LA vol A4C: 60.5 ml
LA vol index: 39 mL/m2
LA vol: 67.3 mL
LDCA: 3.46 cm2
LV E/e' medial: 7.7
LV TDI E'LATERAL: 8.27
LVDIAVOL: 71 mL (ref 46–106)
LVDIAVOLIN: 41 mL/m2
LVEEAVG: 7.7
LVELAT: 8.27 cm/s
LVOT VTI: 20.3 cm
LVOT peak grad rest: 4 mmHg
LVOT peak vel: 104 cm/s
LVOTD: 21 mm
LVOTSV: 70 mL
LVSYSVOL: 25 mL
LVSYSVOLIN: 15 mL/m2
MV Dec: 373
MV pk A vel: 87.9 m/s
MV pk E vel: 63.7 m/s
PW: 11.3 mm — AB (ref 0.6–1.1)
RV LATERAL S' VELOCITY: 18.5 cm/s
Reg peak vel: 222 cm/s
Simpson's disk: 64
Stroke v: 46 ml
TAPSE: 23 mm
TDI e' medial: 5.66
TRMAXVEL: 222 cm/s
WEIGHTICAEL: 2169.6 [oz_av]

## 2017-04-17 LAB — URINALYSIS, ROUTINE W REFLEX MICROSCOPIC
BILIRUBIN URINE: NEGATIVE
GLUCOSE, UA: NEGATIVE mg/dL
Ketones, ur: NEGATIVE mg/dL
NITRITE: NEGATIVE
PH: 7 (ref 5.0–8.0)
Protein, ur: 30 mg/dL — AB
SPECIFIC GRAVITY, URINE: 1.01 (ref 1.005–1.030)

## 2017-04-17 LAB — CBC
HCT: 29.3 % — ABNORMAL LOW (ref 36.0–46.0)
Hemoglobin: 9.4 g/dL — ABNORMAL LOW (ref 12.0–15.0)
MCH: 29.7 pg (ref 26.0–34.0)
MCHC: 32.1 g/dL (ref 30.0–36.0)
MCV: 92.7 fL (ref 78.0–100.0)
Platelets: 313 10*3/uL (ref 150–400)
RBC: 3.16 MIL/uL — ABNORMAL LOW (ref 3.87–5.11)
RDW: 14.9 % (ref 11.5–15.5)
WBC: 5.3 10*3/uL (ref 4.0–10.5)

## 2017-04-17 LAB — GLUCOSE, CAPILLARY: GLUCOSE-CAPILLARY: 85 mg/dL (ref 65–99)

## 2017-04-17 LAB — TROPONIN I
Troponin I: <0.03 ng/mL (ref <0.03)
Troponin I: <0.03 ng/mL (ref <0.03)

## 2017-04-17 MED ORDER — ENSURE ENLIVE PO LIQD
237.0000 mL | Freq: Two times a day (BID) | ORAL | Status: DC
  Administered 2017-04-17 – 2017-04-19 (×5): 237 mL via ORAL

## 2017-04-17 MED ORDER — DIPHENHYDRAMINE HCL 25 MG PO CAPS
25.0000 mg | ORAL_CAPSULE | Freq: Once | ORAL | Status: AC
  Administered 2017-04-17: 25 mg via ORAL
  Filled 2017-04-17: qty 1

## 2017-04-17 MED ORDER — SODIUM CHLORIDE 0.9 % IV BOLUS (SEPSIS)
500.0000 mL | Freq: Once | INTRAVENOUS | Status: AC
  Administered 2017-04-17: 500 mL via INTRAVENOUS

## 2017-04-17 MED ORDER — BUTALBITAL-APAP-CAFFEINE 50-325-40 MG PO TABS
2.0000 | ORAL_TABLET | Freq: Once | ORAL | Status: AC
  Administered 2017-04-17: 2 via ORAL
  Filled 2017-04-17: qty 2

## 2017-04-17 MED ORDER — PROCHLORPERAZINE EDISYLATE 5 MG/ML IJ SOLN
10.0000 mg | Freq: Once | INTRAMUSCULAR | Status: AC
  Administered 2017-04-17: 10 mg via INTRAVENOUS
  Filled 2017-04-17: qty 2

## 2017-04-17 NOTE — Progress Notes (Signed)
PROGRESS NOTE    Tanya Wright  SWN:[id number] DOB: 01/28/1966 DOA: 04/16/2017 PCP: [facility]   Brief Narrative: Tanya Wright is Tanya Wright 51 y.o. female with medical history significant of stage IV cervical cancer on chemo, HTN,  CKD, bilateral ureteral stents, thyroid mass here with headache, chest pain, and shortness of breath that began on 12/24.  Last chemo was 12/20.   Assessment & Plan:   Principal Problem:   AKI (acute kidney injury) ([place]) Active Problems:   Uterine cervix cancer (HCC)   Thyroid mass of unclear etiology   CKD (chronic kidney disease)   Abdominal pain   Chest Pain  Dyspnea:  Atypical in description.  Improving (seemed most improved after dilaudid, maybe [place] little after nitro).  No CP with exertion.  SOB started this morning as well, describes as constant.  Unable to describe much more than this.  Had VQ scan in ED which was normal.  CXR negative.  Satting normally.  Initially tachy, now improved.  Unclear cause of her multitude of sx which started this morning, maybe related to recent chemo?   Improved today Given ASA.  Nitro prn. Repeat EKG stable Trend troponins - negative Echo with grade 1 DD (see report) BNP negative Continue to monitor  Headache:  Persistent today.  Frontal, suspect tension.  Continue APAP prn.  Trial fioricet, then migraine cocktail prn.   Abdominal Pain:  She denied this to me, but was tender on exam.  CT was not notable for acute process involving bowel.  Possibly related to cancer.  Lipase mildly elevated, but no CT evidence of pancreatitis.  She notes she feels constipated and attributes many of her symptoms including her CP to this.  UA pending Soap sud enema, miralax BID, senna  Acute Kidney Injury on CKD  Bilateral Ureteral Stents  Dehydration:  Baseline Cr around 1.7-1.8.  Suspect due to dehydration.  Will continue IVF, repeat AM labs.  She has bilateral ureteral stents.  No significant hydro seen on  CT, though "mildly prominent renal collecting systems".  Improving, continue to monitor closely.  She notes decreased PO intake, encouraged PO.   Cervical Cancer:  S/p tax/carboplatin 04/12/2017.  Follows at [facility].  Suspect some of sx may be related to her recent chemo, she suspects this as well.  Continue oxycodone for cancer related pain Has dilaudid ordered as well, use this for pain not resolved with PO pain meds  Thyroid Mass: f/u with [facility] providers  Anemia: chronic, downtrending, but suspect dilutional component  DVT prophylaxis: SCD Code Status: full  Family Communication: none at bedside Disposition Plan: pending sx improvement   Consultants:   none  Procedures: (Don't include imaging studies which can be auto populated. Include things that cannot be auto populated i.e. Echo, Carotid and venous dopplers, Foley, Bipap, HD, tubes/drains, wound vac, central lines etc)  Echo pending  Antimicrobials: (specify start and planned stop date. Auto populated tables are space occupying and do not give end dates)  none    Subjective: Feels better mostly, but with persistent frontal headache.  Still feels constipated. No CP, no SOB.   Objective: Vitals:   04/16/17 1730 04/16/17 1824 04/16/17 2238 04/17/17 0518  BP: 123/83 111/75 132/90 124/79  Pulse: 91 82 74 90  Resp: 20 20 20 20   Temp:  98.7 F (37.1 C) 98.7 F (37.1 C) 98.2 F (36.8 C)  TempSrc:  Oral Oral Oral  SpO2: 100% 98% 100% 100%  Weight:  61.5 kg (  135 lb 9.6 oz)    Height:  5\' 9"  (1.753 m)      Intake/Output Summary (Last 24 hours) at 04/17/2017 0850 Last data filed at 04/17/2017 0500 Gross per 24 hour  Intake 2531.67 ml  Output -  Net 2531.67 ml   Filed Weights   04/16/17 1115 04/16/17 1824  Weight: 61.2 kg (135 lb) 61.5 kg (135 lb 9.6 oz)    Examination:  General exam: Appears calm and comfortable  Respiratory system: Clear to auscultation. Respiratory effort normal. Cardiovascular system: S1 &  S2 heard, RRR. No JVD, murmurs, rubs, gallops or clicks. No pedal edema. Gastrointestinal system: Diffuse TTP Central nervous system: Alert and oriented. No focal neurological deficits. Extremities: Symmetric 5 x 5 power. Skin: No rashes, lesions or ulcers Psychiatry: Judgement and insight appear normal. Mood & affect appropriate.     Data Reviewed: I have personally reviewed following labs and imaging studies  CBC: Recent Labs  Lab 04/16/17 1140 04/17/17 0546  WBC 5.6 5.3  HGB 10.9* 9.4*  HCT 34.1* 29.3*  MCV 92.4 92.7  PLT 359 973   Basic Metabolic Panel: Recent Labs  Lab 04/16/17 1140 04/17/17 0546  NA 138 137  K 3.9 4.4  CL 103 109  CO2 24 21*  GLUCOSE 106* 91  BUN 20 18  CREATININE 2.45* 1.94*  CALCIUM 9.6 8.8*   GFR: Estimated Creatinine Clearance: 32.9 mL/min (Glendon Fiser) (by C-G formula based on SCr of 1.94 mg/dL (H)). Liver Function Tests: Recent Labs  Lab 04/16/17 1205 04/17/17 0546  AST 17 15  ALT 9* 9*  ALKPHOS 64 54  BILITOT 0.5 0.3  PROT 7.5 6.0*  ALBUMIN 3.3* 2.7*   Recent Labs  Lab 04/16/17 1205  LIPASE 59*   No results for input(s): AMMONIA in the last 168 hours. Coagulation Profile: No results for input(s): INR, PROTIME in the last 168 hours. Cardiac Enzymes: Recent Labs  Lab 04/16/17 1140 04/16/17 1811 04/16/17 2328 04/17/17 0546  TROPONINI <0.03 <0.03 <0.03 <0.03   BNP (last 3 results) No results for input(s): PROBNP in the last 8760 hours. HbA1C: No results for input(s): HGBA1C in the last 72 hours. CBG: Recent Labs  Lab 04/17/17 0809  GLUCAP 85   Lipid Profile: No results for input(s): CHOL, HDL, LDLCALC, TRIG, CHOLHDL, LDLDIRECT in the last 72 hours. Thyroid Function Tests: No results for input(s): TSH, T4TOTAL, FREET4, T3FREE, THYROIDAB in the last 72 hours. Anemia Panel: No results for input(s): VITAMINB12, FOLATE, FERRITIN, TIBC, IRON, RETICCTPCT in the last 72 hours. Sepsis Labs: No results for input(s):  PROCALCITON, LATICACIDVEN in the last 168 hours.  No results found for this or any previous visit (from the past 240 hour(s)).       Radiology Studies: Ct Abdomen Pelvis Wo Contrast  Result Date: 04/16/2017 CLINICAL DATA:  Mid abdominal pain and current treatment for cervical carcinoma. EXAM: CT ABDOMEN AND PELVIS WITHOUT CONTRAST TECHNIQUE: Multidetector CT imaging of the abdomen and pelvis was performed following the standard protocol without IV contrast. COMPARISON:  None. FINDINGS: Lower chest: No acute abnormality. Hepatobiliary: No focal liver abnormality is seen. No gallstones, gallbladder wall thickening, or biliary dilatation. Pancreas: Unremarkable. No pancreatic ductal dilatation or surrounding inflammatory changes. Spleen: Normal in size without focal abnormality. Adrenals/Urinary Tract: No adrenal masses identified. Bilateral ureteral stents present. Each extends from the level of the distal renal pelvis down into the bladder. Bilateral renal collecting systems show minimal prominence without evidence of significant hydronephrosis. No renal calculi or visualized renal masses  by unenhanced CT. Stomach/Bowel: Bowel shows no evidence of obstruction or inflammation. No focal bowel wall thickening, pneumatosis or free air identified. Vascular/Lymphatic: No vascular abnormalities by unenhanced CT. There are Isyss Espinal number of calcified retroperitoneal lymph nodes primarily in the periaortic and aortocaval regions but also in the iliac chains bilaterally. Some of these are heavily calcified and some partially calcified. The largest heavily calcified lymph node in the proximal right common iliac region measures 1.8 cm. There are some partially calcified lymph nodes also in the gastrohepatic region and celiac region. Calcifications likely reflect treated lymph nodes. Comment on treatment response cannot be made has no prior imaging appears to be available for comparison. Reproductive: There is some air in  the endometrial cavity and vagina. No adnexal masses. Other: No hernias identified. No focal abscess or free fluid identified. No evidence of obvious carcinomatosis. Musculoskeletal: Degenerate disc disease with vacuum disc at L3-4 and L5-S1. IMPRESSION: 1. Multitude of partially calcified and heavily calcified lymph nodes throughout the retroperitoneal space and also in the gastro patent and celiac region. The highest number of calcified nodes are in the para-aortic retroperitoneum and proximal iliac chain. These are all consistent with treated lymph nodes. No prior imaging is available to determine stability or response to treatment. 2. Bilateral ureteral stents present. The stents appear well positioned. The renal collecting systems are mildly prominent without significant hydronephrosis present. 3. No evidence of acute process involving bowel. Electronically Signed   By: Aletta Edouard M.D.   On: 04/16/2017 14:13   Dg Chest 2 View  Result Date: 04/16/2017 CLINICAL DATA:  Chest pain EXAM: CHEST  2 VIEW COMPARISON:  None. FINDINGS: Right Port-Reeve Mallo-Cath tip in the SVC. Heart is normal size. Lungs are clear. No effusions or acute bony abnormality. IMPRESSION: No active cardiopulmonary disease. Electronically Signed   By: Rolm Baptise M.D.   On: 04/16/2017 11:37   Nm Pulmonary Vent And Perf (v/q Scan)  Result Date: 04/16/2017 CLINICAL DATA:  Acute onset of chest pain and shortness of breath that began this morning. Current history of chronic kidney disease which precluded performance of Herberto Ledwell Kynadi Dragos CTA chest. EXAM: NUCLEAR MEDICINE VENTILATION - PERFUSION LUNG SCAN TECHNIQUE: Ventilation images were obtained in multiple projections using inhaled aerosol Tc-23m DTPA. Perfusion images were obtained in multiple projections after intravenous injection of Tc-73m MAA. RADIOPHARMACEUTICALS:  32 mCi Technetium-7m DTPA aerosol inhalation and 4.4 mCi Technetium-17m MAA IV COMPARISON:  Two-view chest x-ray obtained earlier  same day. No prior chest imaging otherwise. FINDINGS: Ventilation: No focal ventilation defect. Activity within the esophagus due to ingested aerosol. Perfusion: No wedge shaped peripheral perfusion defects to suggest acute pulmonary embolism. IMPRESSION: Normal examination. Electronically Signed   By: Evangeline Dakin M.D.   On: 04/16/2017 16:12        Scheduled Meds: . calcium acetate  1,334 mg Oral TID WC  . feeding supplement (ENSURE ENLIVE)  237 mL Oral BID BM  . ferrous sulfate  325 mg Oral TID  . FLUoxetine  10 mg Oral Daily  . polyethylene glycol  17 g Oral BID  . senna  1 tablet Oral QHS   Continuous Infusions: . sodium chloride 125 mL/hr at 04/17/17 0500     LOS: 0 days    Time spent: over 30 min    Fayrene Helper, MD [facility] [phone]  If 7PM-7AM, please contact night-coverage [email or web address] Password [email or web address] 04/17/2017, 8:50 AM

## 2017-04-17 NOTE — Progress Notes (Signed)
There was an order from yesterday / last evening for soap suds enema which patient wanted to wait until this morning..  Pt requested this morning.  Tech performed.  Pt tolerated well.  Large amount of stool evacuated from rectum.   Pt reports she feels much better.

## 2017-04-17 NOTE — Progress Notes (Signed)
*  PRELIMINARY RESULTS* Echocardiogram 2D Echocardiogram has been performed.  Samuel Germany 04/17/2017, 10:11 AM

## 2017-04-18 ENCOUNTER — Observation Stay (HOSPITAL_COMMUNITY): Payer: Medicaid Other

## 2017-04-18 DIAGNOSIS — R109 Unspecified abdominal pain: Secondary | ICD-10-CM | POA: Diagnosis not present

## 2017-04-18 DIAGNOSIS — N179 Acute kidney failure, unspecified: Secondary | ICD-10-CM | POA: Diagnosis not present

## 2017-04-18 LAB — DIFFERENTIAL
Basophils Absolute: 0 10*3/uL (ref 0.0–0.1)
Basophils Relative: 0 %
EOS PCT: 1 %
Eosinophils Absolute: 0 10*3/uL (ref 0.0–0.7)
LYMPHS ABS: 1.5 10*3/uL (ref 0.7–4.0)
LYMPHS PCT: 35 %
MONO ABS: 0.2 10*3/uL (ref 0.1–1.0)
Monocytes Relative: 5 %
NEUTROS PCT: 59 %
Neutro Abs: 2.6 10*3/uL (ref 1.7–7.7)

## 2017-04-18 LAB — CBC
HEMATOCRIT: 28.4 % — AB (ref 36.0–46.0)
HEMOGLOBIN: 9.1 g/dL — AB (ref 12.0–15.0)
MCH: 29.7 pg (ref 26.0–34.0)
MCHC: 32 g/dL (ref 30.0–36.0)
MCV: 92.8 fL (ref 78.0–100.0)
Platelets: 253 10*3/uL (ref 150–400)
RBC: 3.06 MIL/uL — ABNORMAL LOW (ref 3.87–5.11)
RDW: 14.7 % (ref 11.5–15.5)
WBC: 4.4 10*3/uL (ref 4.0–10.5)

## 2017-04-18 LAB — BASIC METABOLIC PANEL
Anion gap: 8 (ref 5–15)
BUN: 14 mg/dL (ref 6–20)
CHLORIDE: 108 mmol/L (ref 101–111)
CO2: 21 mmol/L — AB (ref 22–32)
CREATININE: 1.88 mg/dL — AB (ref 0.44–1.00)
Calcium: 8.9 mg/dL (ref 8.9–10.3)
GFR calc Af Amer: 34 mL/min — ABNORMAL LOW (ref >60)
GFR calc non Af Amer: 30 mL/min — ABNORMAL LOW (ref >60)
Glucose, Bld: 87 mg/dL (ref 65–99)
Potassium: 4.5 mmol/L (ref 3.5–5.1)
Sodium: 137 mmol/L (ref 135–145)

## 2017-04-18 LAB — URINALYSIS, ROUTINE W REFLEX MICROSCOPIC
Bilirubin Urine: NEGATIVE
Glucose, UA: NEGATIVE mg/dL
Ketones, ur: NEGATIVE mg/dL
NITRITE: NEGATIVE
PROTEIN: 30 mg/dL — AB
SPECIFIC GRAVITY, URINE: 1.009 (ref 1.005–1.030)
SQUAMOUS EPITHELIAL / LPF: NONE SEEN
pH: 7 (ref 5.0–8.0)

## 2017-04-18 LAB — INFLUENZA PANEL BY PCR (TYPE A & B)
INFLBPCR: NEGATIVE
Influenza A By PCR: NEGATIVE

## 2017-04-18 LAB — GLUCOSE, CAPILLARY: Glucose-Capillary: 76 mg/dL (ref 65–99)

## 2017-04-18 MED ORDER — DOXYCYCLINE HYCLATE 100 MG PO TABS
100.0000 mg | ORAL_TABLET | Freq: Two times a day (BID) | ORAL | Status: DC
  Administered 2017-04-18 – 2017-04-19 (×2): 100 mg via ORAL
  Filled 2017-04-18 (×2): qty 1

## 2017-04-18 NOTE — Progress Notes (Addendum)
PROGRESS NOTE    Tanya Wright  XBM:[id number] DOB: 02/25/1966 DOA: 04/16/2017 PCP: [facility]   Brief Narrative: Tanya Wright is Tanya Wright 51 y.o. female with medical history significant of stage IV cervical cancer on chemo, HTN,  CKD, bilateral ureteral stents, thyroid mass here with headache, chest pain, and shortness of breath that began on 12/24.  Last chemo was 12/20.   Assessment & Plan:   Principal Problem:   AKI (acute kidney injury) ([place]) Active Problems:   Uterine cervix cancer (HCC)   Thyroid mass of unclear etiology   CKD (chronic kidney disease)   Abdominal pain  Fever:  Fever overnight, will check flu given general malaise as well as blood cx and urine cx.  Previous CXR without cardiopulm disease and pt notes improved CP and SOB.  Given her active chemotherapy with follow blood cx and observe another night to ensure no recurrent fever and negative initial cx.  Pt with L groin nodule with [place] c/w cellulitis.  Will start doxy.  Normal ANC.   Chest Pain  Dyspnea:  Atypical in description.  Improving (seemed most improved after dilaudid, maybe Rhylee Nunn little after nitro).  No CP with exertion.  SOB started this morning as well, describes as constant.  Unable to describe much more than this.  Had VQ scan in ED which was normal.  CXR negative.  Satting normally.  Initially tachy, now improved.  Unclear cause of her multitude of sx which started this morning, maybe related to recent chemo?   Improved  Given ASA.  Nitro prn. Repeat EKG stable Trend troponins - negative Echo with grade 1 DD (see report) BNP negative Continue to monitor  Headache:  Improving.  today.  Frontal, suspect tension.  Continue APAP prn.  Trial fioricet, then migraine cocktail prn.   Abdominal Pain:  She denied this to me, but was tender on exam.  CT was not notable for acute process involving bowel.  Possibly related to cancer.  Lipase mildly elevated, but no CT evidence of pancreatitis.   She notes she feels constipated and attributes many of her symptoms including her CP to this.  UA with hematuria, repeating UA as above, follow up cx.   Soap sud enema, miralax BID, senna  Hematuria: follow repeat UA and culture.  Will need outpatient follow up.  Acute Kidney Injury on CKD  Bilateral Ureteral Stents  Dehydration:  Baseline Cr around 1.7-1.8.  Suspect due to dehydration.  Will continue IVF, repeat AM labs.  She has bilateral ureteral stents.  No significant hydro seen on CT, though "mildly prominent renal collecting systems".  Improving, continue to monitor closely.   She notes decreased PO intake, encouraged PO.   Cervical Cancer:  S/p tax/carboplatin 04/12/2017.  Follows at [facility].  Suspect some of sx may be related to her recent chemo, she suspects this as well.  Continue oxycodone for cancer related pain Has dilaudid ordered as well, use this for pain not resolved with PO pain meds  Thyroid Mass: f/u with [facility] providers  Anemia: chronic, downtrending, but suspect dilutional component  DVT prophylaxis: SCD Code Status: full  Family Communication: none at bedside Disposition Plan: pending sx improvement   Consultants:   none  Procedures: (Don't include imaging studies which can be auto populated. Include things that cannot be auto populated i.e. Echo, Carotid and venous dopplers, Foley, Bipap, HD, tubes/drains, wound vac, central lines etc)  Echo pending  Antimicrobials: (specify start and planned stop date. Auto populated  tables are space occupying and do not give end dates)  none    Subjective: L groin nodule, painful.  HA persistent, maybe Koleson Reifsteck bit better. Generally feels poorly, achy.   Objective: Vitals:   04/17/17 1500 04/17/17 2049 04/17/17 2149 04/18/17 0657  BP: 126/85  (!) 141/91 136/88  Pulse: 92  90 85  Resp: 20  18 20   Temp: 98.7 F (37.1 C)  (!) 101.2 F (38.4 C) 98.8 F (37.1 C)  TempSrc: Oral  Oral Oral  SpO2: 100% 98% 99% 100%    Weight:      Height:        Intake/Output Summary (Last 24 hours) at 04/18/2017 1926 Last data filed at 04/18/2017 1700 Gross per 24 hour  Intake 3524.58 ml  Output -  Net 3524.58 ml   Filed Weights   04/16/17 1115 04/16/17 1824  Weight: 61.2 kg (135 lb) 61.5 kg (135 lb 9.6 oz)    Examination:  General: No acute distress. HEENT: R tm with normal landmarks, L TM with some erythema, but no bulging, normal landmarks Cardiovascular: Heart sounds show Demmi Sindt regular rate, and rhythm. No gallops or rubs. No murmurs. No JVD.  Lungs: Clear to auscultation bilaterally with good air movement. No rales, rhonchi or wheezes. Abdomen: Soft, mild ttp, nondistended with normal active bowel sounds. No masses. No hepatosplenomegaly. Neurological: Alert and oriented 3. Moves all extremities 4 with equal strength. Cranial nerves II through XII grossly intact. Skin: L groin with ~2cm nodule, tender, no redness Extremities: No clubbing or cyanosis. No edema. Psychiatric: Mood and affect are normal. Insight and judgment are appropriate.  Data Reviewed: I have personally reviewed following labs and imaging studies  CBC: Recent Labs  Lab 04/16/17 1140 04/17/17 0546 04/18/17 0714 04/18/17 0859  WBC 5.6 5.3 4.4  --   NEUTROABS  --   --   --  2.6  HGB 10.9* 9.4* 9.1*  --   HCT 34.1* 29.3* 28.4*  --   MCV 92.4 92.7 92.8  --   PLT 359 313 253  --    Basic Metabolic Panel: Recent Labs  Lab 04/16/17 1140 04/17/17 0546 04/18/17 0714  Tanya 138 137 137  K 3.9 4.4 4.5  CL 103 109 108  CO2 24 21* 21*  GLUCOSE 106* 91 87  BUN 20 18 14   CREATININE 2.45* 1.94* 1.88*  CALCIUM 9.6 8.8* 8.9   GFR: Estimated Creatinine Clearance: 34 mL/min (Harvis Mabus) (by C-G formula based on SCr of 1.88 mg/dL (H)). Liver Function Tests: Recent Labs  Lab 04/16/17 1205 04/17/17 0546  AST 17 15  ALT 9* 9*  ALKPHOS 64 54  BILITOT 0.5 0.3  PROT 7.5 6.0*  ALBUMIN 3.3* 2.7*   Recent Labs  Lab 04/16/17 1205  LIPASE 59*    No results for input(s): AMMONIA in the last 168 hours. Coagulation Profile: No results for input(s): INR, PROTIME in the last 168 hours. Cardiac Enzymes: Recent Labs  Lab 04/16/17 1140 04/16/17 1811 04/16/17 2328 04/17/17 0546  TROPONINI <0.03 <0.03 <0.03 <0.03   BNP (last 3 results) No results for input(s): PROBNP in the last 8760 hours. HbA1C: No results for input(s): HGBA1C in the last 72 hours. CBG: Recent Labs  Lab 04/17/17 0809 04/18/17 0808  GLUCAP 85 76   Lipid Profile: No results for input(s): CHOL, HDL, LDLCALC, TRIG, CHOLHDL, LDLDIRECT in the last 72 hours. Thyroid Function Tests: No results for input(s): TSH, T4TOTAL, FREET4, T3FREE, THYROIDAB in the last 72 hours. Anemia Panel: No  results for input(s): VITAMINB12, FOLATE, FERRITIN, TIBC, IRON, RETICCTPCT in the last 72 hours. Sepsis Labs: No results for input(s): PROCALCITON, LATICACIDVEN in the last 168 hours.  Recent Results (from the past 240 hour(s))  Culture, blood (routine x 2)     Status: None (Preliminary result)   Collection Time: 04/18/17  4:36 PM  Result Value Ref Range Status   Specimen Description BLOOD LEFT HAND  Final   Special Requests   Final    BOTTLES DRAWN AEROBIC AND ANAEROBIC Blood Culture adequate volume   Culture PENDING  Incomplete   Report Status PENDING  Incomplete  Culture, blood (routine x 2)     Status: None (Preliminary result)   Collection Time: 04/18/17  4:41 PM  Result Value Ref Range Status   Specimen Description RIGHT ANTECUBITAL  Final   Special Requests   Final    BOTTLES DRAWN AEROBIC AND ANAEROBIC Blood Culture adequate volume   Culture PENDING  Incomplete   Report Status PENDING  Incomplete         Radiology Studies: [place] Lt Lower Extrem Ltd Soft Tissue Non Vascular  Result Date: 04/18/2017 CLINICAL DATA:  Painful soft tissue nodule in the left thigh posterior medially. EXAM: ULTRASOUND LEFT LOWER EXTREMITY LIMITED TECHNIQUE: Ultrasound examination of  the lower extremity soft tissues was performed in the area of clinical concern. COMPARISON:  CT scan of the abdomen and pelvis dated 02/14/2017 FINDINGS: There is Lauri Till 2.0 x 1.5 x 1.1 cm poorly defined irregular area within the subcutaneous fat of the medial aspect of the upper left thigh and lower left buttock with increased vascularity in and around this area. No discrete focal fluid collection. IMPRESSION: Findings are consistent with focal cellulitis without Charlei Ramsaran definable abscess at this time. The appearance is not typical for tumor. Electronically Signed   By: Lorriane Shire M.D.   On: 04/18/2017 11:26        Scheduled Meds: . calcium acetate  1,334 mg Oral TID WC  . feeding supplement (ENSURE ENLIVE)  237 mL Oral BID BM  . ferrous sulfate  325 mg Oral TID  . FLUoxetine  10 mg Oral Daily  . polyethylene glycol  17 g Oral BID  . senna  1 tablet Oral QHS   Continuous Infusions: . sodium chloride 125 mL/hr at 04/18/17 1633     LOS: 0 days    Time spent: over 30 min    Fayrene Helper, MD [facility] [phone]  If 7PM-7AM, please contact night-coverage [email or web address] Password [facility] 04/18/2017, 7:26 PM

## 2017-04-19 ENCOUNTER — Observation Stay (HOSPITAL_COMMUNITY): Payer: Medicaid Other

## 2017-04-19 DIAGNOSIS — E440 Moderate protein-calorie malnutrition: Secondary | ICD-10-CM

## 2017-04-19 DIAGNOSIS — N179 Acute kidney failure, unspecified: Secondary | ICD-10-CM | POA: Diagnosis not present

## 2017-04-19 LAB — BASIC METABOLIC PANEL
ANION GAP: 9 (ref 5–15)
BUN: 17 mg/dL (ref 6–20)
CALCIUM: 9 mg/dL (ref 8.9–10.3)
CHLORIDE: 106 mmol/L (ref 101–111)
CO2: 21 mmol/L — AB (ref 22–32)
Creatinine, Ser: 1.94 mg/dL — ABNORMAL HIGH (ref 0.44–1.00)
GFR calc non Af Amer: 29 mL/min — ABNORMAL LOW (ref >60)
GFR, EST AFRICAN AMERICAN: 33 mL/min — AB (ref >60)
Glucose, Bld: 115 mg/dL — ABNORMAL HIGH (ref 65–99)
POTASSIUM: 4.2 mmol/L (ref 3.5–5.1)
Sodium: 136 mmol/L (ref 135–145)

## 2017-04-19 LAB — CBC
HEMATOCRIT: 27.4 % — AB (ref 36.0–46.0)
HEMOGLOBIN: 8.7 g/dL — AB (ref 12.0–15.0)
MCH: 29.4 pg (ref 26.0–34.0)
MCHC: 31.8 g/dL (ref 30.0–36.0)
MCV: 92.6 fL (ref 78.0–100.0)
Platelets: 260 10*3/uL (ref 150–400)
RBC: 2.96 MIL/uL — ABNORMAL LOW (ref 3.87–5.11)
RDW: 14.8 % (ref 11.5–15.5)
WBC: 3.6 10*3/uL — AB (ref 4.0–10.5)

## 2017-04-19 LAB — GLUCOSE, CAPILLARY: Glucose-Capillary: 95 mg/dL (ref 65–99)

## 2017-04-19 MED ORDER — SUMATRIPTAN SUCCINATE 50 MG PO TABS
25.0000 mg | ORAL_TABLET | ORAL | Status: DC | PRN
  Administered 2017-04-19: 25 mg via ORAL
  Filled 2017-04-19: qty 1

## 2017-04-19 MED ORDER — ENSURE ENLIVE PO LIQD: 237.0000 mL | Freq: Two times a day (BID) | ORAL | 12 refills | Status: DC

## 2017-04-19 MED ORDER — DOXYCYCLINE HYCLATE 100 MG PO TABS: 100.0000 mg | ORAL_TABLET | Freq: Two times a day (BID) | ORAL | 0 refills | Status: AC

## 2017-04-19 MED ORDER — SUMATRIPTAN SUCCINATE 25 MG PO TABS: 25.0000 mg | ORAL_TABLET | Freq: Once | ORAL | 0 refills | Status: DC

## 2017-04-19 NOTE — Discharge Summary (Signed)
Physician Discharge Summary  Tanya Wright OZD:[id number] DOB: 12-May-1965 DOA: 04/16/2017  PCP: [facility] date: 04/16/2017 Discharge date: 04/19/2017  Time spent: over 30 minutes  Recommendations for Outpatient Follow-up:  1. Follow up repeat outpatient  CBC and CMP 2. Follow up pending cultures (blood and urine) 3. Follow up L groin nodule and see if improvement on doxycycline.  If worsening or no improvement consider extending abx course vs repeat US to r/o abscess. 4. Follow up with urology for bilateral ureteral stents.  Sounds like these were placed in [place], but she plans to f/u at [facility].  Discussed with our urologist today who noted when he reviewed imaging the stents look too short.  Discussed with patient that she should follow up with urology regarding stent placement as well as hematuria (but this is likely due to stents) 5. She had MRI brain given persistent headache which was notable for nonspecific white matter signal changes primarily in both frontal lobes.  Differential broad, would follow up as outpatient.  She had normal neuro exam throughout her stay. 6. Follow up with oncology at [facility] 7. Follow up as planned with [facility] for her thyroid mass    Discharge Diagnoses:  Principal Problem:   AKI (acute kidney injury) ([place]) Active Problems:   Uterine cervix cancer ([place])   Thyroid mass of unclear etiology   CKD (chronic kidney disease)   Abdominal pain   Malnutrition of moderate degree  Discharge Condition: stable  Diet recommendation: heart healthy  Filed Weights   04/16/17 1115 04/16/17 1824  Weight: 61.2 kg (135 lb) 61.5 kg (135 lb 9.6 oz)    History of present illness:  Tanya Wright 51 y.o.femalewith medical history significant ofstage IV cervical cancer on chemo, HTN, CKD, bilateral ureteral stents, thyroid mass here with headache, chest pain, and shortness of breath that began on 12/24.  Last chemo was 12/20.   She  presented with several complaints including headache, CP, and SOB as well as abdominal pain and constipation.  Her CP and SOB were worked up with serial troponins (negative), V/Q scan (normal), echo (notable for grade 1 diastolic dysfunction, mildly dilated LA).  Her CP and SOB had generally resolved on hospital day 1.  She continued to have headaches that were not notably improved with fioricet or tylenol.  She improved with Deasia Chiu headache cocktail.  On 12/27, due to persistent HA, we obtained MRI imaging which was notable for nonspecific white matter signal changes.  Her HA improved with triptans and we discharged her with this.  She also had Marquee Fuchs fever and blood cx were obtained as well as urine cx.  These were pending at the time of discharge.  She had Zunaira Lamy [place] of her L groin which showed focal cellulitis.  She was discharged on doxycycline.   Hospital Course:   Fever:  Fever on 12/25.  She had negative flu.  Blood and urine cx were checked given her hx of recent chemo.  Normal ANC.  She did have Burnard Enis tender nodule in her L groin which was c/w cellulitis on US imaging.  She was discharged with doxycycline.  Cultures at the time of discharge were no growth and she had no recurrent fevers.  Will need to follow up cx as outpatient.   Chest Pain  Dyspnea: Atypical in description. Improved on hosptial day 1. No CP with exertion. VQ scan in ED which was normal. CXR negative. Satting normally. Initially tachy, but improved. Unclear cause  of her multitude of sx, maybe related to recent chemo?  Improved at discharge Given ASA. Nitro prn. Repeat EKG stable Trend troponins - negative Echo with grade 1 DD (see report) BNP negative Continue to monitor  Headache: Persistent on day of discharge.  We obtained MRI brain given lack of improvement which was notable for nonspecific white matter changes which will need outpatient follow up.  She did improve with sumatriptan so this was provided at  discharge.  Abdominal Pain:Improved but still present at discharge.  CT was not notable for acute process involving bowel. Possibly related to cancer. Lipase mildly elevated, but no CT evidence of pancreatitis. She noted constipation and attributed many of her symptoms including her CP to this.  Improved at discharge, continue to monitor  Hematuria: follow repeat UA and culture.  Likely 2/2 stents.  Follow up with urology.  Acute Kidney Injury on CKD Bilateral Ureteral Stents  Dehydration: Baseline Cr around 1.7-1.8 in our system, but care everywhere with 12/20 Cr of 2.23). Suspected due to dehydration. Will continue IVF, repeat AM labs. She has bilateral ureteral stents. No significant hydro seen on CT, though "mildly prominent renal collecting systems".  Cr around 1.94 at discharge.  Needs follow up with urology.  Discussed with our urologist here today who noted it looked like stents may be too short.  She's planning to follow up with urology at [facility].  Cervical Cancer:S/p tax/carboplatin 04/12/2017. Follows at [facility].  Ssome of sx may be related to her recent chemo, she suspects this as well.  Continue oxycodone for cancer related pain Has dilaudid ordered as well, use this for pain not resolved with PO pain meds  Thyroid Mass: f/u with [facility] providers  Anemia: chronic, downtrending, but suspect dilutional component, follow up outpatient  Procedures: Echo 12/25 Study Conclusions  - Left ventricle: The cavity size was normal. There was mild   concentric hypertrophy. Systolic function was normal. The   estimated ejection fraction was in the range of 60% to 65%. Wall   motion was normal; there were no regional wall motion   abnormalities. There was an increased relative contribution of   atrial contraction to ventricular filling. Doppler parameters are   consistent with abnormal left ventricular relaxation (grade 1   diastolic dysfunction). - Left atrium: The atrium was  mildly dilated.  Consultations:  none  Discharge Exam: Vitals:   04/19/17 0541 04/19/17 1300  BP: 132/78 129/78  Pulse: 87 90  Resp: 20 19  Temp: 98.4 F (36.9 C) 98.5 F (36.9 C)  SpO2: 100% 99%   Persistent HA, became tearful and worried regarding this.  Decided to obtain MRI. After triptan, HA improved.  [place] with going home.  General: No acute distress. Cardiovascular: Heart sounds show Krishna Heuer regular rate, and rhythm. No gallops or rubs. No murmurs. No JVD. Lungs: Clear to auscultation bilaterally with good air movement. No rales, rhonchi or wheezes. Abdomen: Soft, diffuse mild ttp, nondistended with normal active bowel sounds. No masses. No hepatosplenomegaly. Neurological: Alert and oriented 3. Moves all extremities 4 with equal strength. Cranial nerves II through XII intact. Skin: 2 cm tender nodule to L groin Extremities: No clubbing or cyanosis. No edema. Pedal pulses 2+. Psychiatric: Mood and affect are normal. Insight and judgment are appropriate.   Discharge Instructions   Discharge Instructions    Call MD for:  difficulty breathing, headache or visual disturbances   Complete by:  As directed    Call MD for:  extreme fatigue  Complete by:  As directed    Call MD for:  persistant dizziness or light-headedness   Complete by:  As directed    Call MD for:  persistant nausea and vomiting   Complete by:  As directed    Call MD for:  redness, tenderness, or signs of infection (pain, swelling, redness, odor or green/yellow discharge around incision site)   Complete by:  As directed    Call MD for:  severe uncontrolled pain   Complete by:  As directed    Call MD for:  temperature >100.4   Complete by:  As directed    Diet - low sodium heart healthy   Complete by:  As directed    Discharge instructions   Complete by:  As directed    You were seen for chest pain, shortness of breath, and headaches and general malaise.   Your chest pain and shortness of breath  improved and your workup was reassuring.   Your headaches were persistent.  We did an MRI that had nonspecific findings that I think are probably not related to your headaches, but you can follow this up with your PCP. The sumatriptan helped with your headache.  You can use this as needed for headaches not improved with tylenol.  If the first dose does note work, you can repeat the dose in 2 hours (don't take more than 2 doses in 1 day).  Do not take this more than twice weekly for headaches.   Please follow up with oncology as an outpatient. Follow up with urology, our urologist thought your stents may need to be replaced because they looked short.  Please follow up with urology regarding this and the blood in your urine (I think this is due to the stents).  I prescribed 5 days of doxycycline for the nodule in your thigh.  If this does not improve, it may need additional antibiotics or reevaluation to see if there is an abscess.  We took blood and urine cultures because of your fever, your PCP should look into the results of these cultures.  At the time of discharge, they had not grown anything.  Please follow up with your PCP Monday.  Have them request records from this hospitalization so they know what was done.   Increase activity slowly   Complete by:  As directed      Allergies as of 04/19/2017   No Known Allergies     Medication List    TAKE these medications   acetaminophen 500 MG tablet Commonly known as:  TYLENOL Take 1,000 mg by mouth every 6 (six) hours as needed for mild pain, moderate pain or headache.   calcium acetate 667 MG capsule Commonly known as:  PHOSLO Take 1,334 mg by mouth 3 (three) times daily with meals.   doxycycline 100 MG tablet Commonly known as:  VIBRA-TABS Take 1 tablet (100 mg total) by mouth every 12 (twelve) hours for 5 days.   feeding supplement (ENSURE ENLIVE) Liqd Take 237 mLs by mouth 2 (two) times daily between meals.   ferrous sulfate 325  (65 FE) MG tablet Take 325 mg by mouth 3 (three) times daily.   FLUoxetine 10 MG capsule Commonly known as:  PROZAC Take 10 mg by mouth daily.   Oxycodone HCl 10 MG Tabs Take 10 mg by mouth every 4 (four) hours as needed (pain).   polyethylene glycol packet Commonly known as:  MIRALAX / GLYCOLAX Take 17 g by mouth daily.  senna 8.6 MG tablet Commonly known as:  SENOKOT Take 1 tablet by mouth at bedtime.   SUMAtriptan 25 MG tablet Commonly known as:  IMITREX Take 1 tablet (25 mg total) by mouth once for 1 dose. Repeat in 2 hours if headache persists.  Do not take more than two days in Maekayla Giorgio week.      No Known Allergies    The results of significant diagnostics from this hospitalization (including imaging, microbiology, ancillary and laboratory) are listed below for reference.    Significant Diagnostic Studies: Ct Abdomen Pelvis Wo Contrast  Result Date: 04/16/2017 CLINICAL DATA:  Mid abdominal pain and current treatment for cervical carcinoma. EXAM: CT ABDOMEN AND PELVIS WITHOUT CONTRAST TECHNIQUE: Multidetector CT imaging of the abdomen and pelvis was performed following the standard protocol without IV contrast. COMPARISON:  None. FINDINGS: Lower chest: No acute abnormality. Hepatobiliary: No focal liver abnormality is seen. No gallstones, gallbladder wall thickening, or biliary dilatation. Pancreas: Unremarkable. No pancreatic ductal dilatation or surrounding inflammatory changes. Spleen: Normal in size without focal abnormality. Adrenals/Urinary Tract: No adrenal masses identified. Bilateral ureteral stents present. Each extends from the level of the distal renal pelvis down into the bladder. Bilateral renal collecting systems show minimal prominence without evidence of significant hydronephrosis. No renal calculi or visualized renal masses by unenhanced CT. Stomach/Bowel: Bowel shows no evidence of obstruction or inflammation. No focal bowel wall thickening, pneumatosis or free  air identified. Vascular/Lymphatic: No vascular abnormalities by unenhanced CT. There are Bettey Muraoka number of calcified retroperitoneal lymph nodes primarily in the periaortic and aortocaval regions but also in the iliac chains bilaterally. Some of these are heavily calcified and some partially calcified. The largest heavily calcified lymph node in the proximal right common iliac region measures 1.8 cm. There are some partially calcified lymph nodes also in the gastrohepatic region and celiac region. Calcifications likely reflect treated lymph nodes. Comment on treatment response cannot be made has no prior imaging appears to be available for comparison. Reproductive: There is some air in the endometrial cavity and vagina. No adnexal masses. Other: No hernias identified. No focal abscess or free fluid identified. No evidence of obvious carcinomatosis. Musculoskeletal: Degenerate disc disease with vacuum disc at L3-4 and L5-S1. IMPRESSION: 1. Multitude of partially calcified and heavily calcified lymph nodes throughout the retroperitoneal space and also in the gastro patent and celiac region. The highest number of calcified nodes are in the para-aortic retroperitoneum and proximal iliac chain. These are all consistent with treated lymph nodes. No prior imaging is available to determine stability or response to treatment. 2. Bilateral ureteral stents present. The stents appear well positioned. The renal collecting systems are mildly prominent without significant hydronephrosis present. 3. No evidence of acute process involving bowel. Electronically Signed   By: Aletta Edouard M.D.   On: 04/16/2017 14:13   Dg Chest 2 View  Result Date: 04/16/2017 CLINICAL DATA:  Chest pain EXAM: CHEST  2 VIEW COMPARISON:  None. FINDINGS: Right [place] Iverson-Cath tip in the SVC. Heart is normal size. Lungs are clear. No effusions or acute bony abnormality. IMPRESSION: No active cardiopulmonary disease. Electronically Signed   By: Rolm Baptise  M.D.   On: 04/16/2017 11:37   Mr Brain Wo Contrast  Result Date: 04/19/2017 CLINICAL DATA:  51 year old female with headache for 1 month. Recent abdominal pain. Undergoing treatment for cervical carcinoma. EXAM: MRI HEAD WITHOUT CONTRAST TECHNIQUE: Multiplanar, multiecho pulse sequences of the brain and surrounding structures were obtained without intravenous contrast. COMPARISON:  No prior head  imaging. FINDINGS: Brain: Normal cerebral volume for age. No restricted diffusion to suggest acute infarction. No midline shift, mass effect, evidence of mass lesion, ventriculomegaly, extra-axial collection or acute intracranial hemorrhage. Cervicomedullary junction and pituitary are within normal limits. Scattered small foci of subcortical white matter T2 and FLAIR hyperintensity in both frontal lobes (series 9 and series 13). Little cerebral white matter involvement in other lobes. No temporal lobe involvement. The configuration is nonspecific but the extent is moderate for age. No areas suggestive of vasogenic edema. No cortical encephalomalacia or chronic cerebral blood products identified. Deep gray matter nuclei, brainstem, and cerebellum appear normal. Vascular: Major intracranial vascular flow voids are preserved, the distal left vertebral artery appears dominant. Skull and upper cervical spine: Negative visualized cervical spine. Visualized bone marrow signal is within normal limits. Sinuses/Orbits: Normal orbits soft tissues. Mild bilateral paranasal sinus mucosal thickening. Other: Visible internal auditory structures appear normal. Mastoid air cells are clear. Scalp and face soft tissues appear negative. IMPRESSION: 1.  No acute intracranial abnormality. 2. Moderately advanced for age but nonspecific cerebral white matter signal changes, primarily in both frontal lobes. Differential considerations include accelerated small vessel ischemia, sequelae of trauma, hypercoagulable state, vasculitis, migraines,  prior infection or less likely demyelination. Electronically Signed   By: Genevie Ann M.D.   On: 04/19/2017 10:50   Nm Pulmonary Vent And Perf (v/q Scan)  Result Date: 04/16/2017 CLINICAL DATA:  Acute onset of chest pain and shortness of breath that began this morning. Current history of chronic kidney disease which precluded performance of Ellarae Nevitt Jsaon Yoo CTA chest. EXAM: NUCLEAR MEDICINE VENTILATION - PERFUSION LUNG SCAN TECHNIQUE: Ventilation images were obtained in multiple projections using inhaled aerosol Tc-60m DTPA. Perfusion images were obtained in multiple projections after intravenous injection of Tc-66m MAA. RADIOPHARMACEUTICALS:  32 mCi Technetium-34m DTPA aerosol inhalation and 4.4 mCi Technetium-35m MAA IV COMPARISON:  Two-view chest x-ray obtained earlier same day. No prior chest imaging otherwise. FINDINGS: Ventilation: No focal ventilation defect. Activity within the esophagus due to ingested aerosol. Perfusion: No wedge shaped peripheral perfusion defects to suggest acute pulmonary embolism. IMPRESSION: Normal examination. Electronically Signed   By: Evangeline Dakin M.D.   On: 04/16/2017 16:12   [place] Soft Tissue Non Vascular  Result Date: 04/18/2017 CLINICAL DATA:  Painful soft tissue nodule in the left thigh posterior medially. EXAM: ULTRASOUND LEFT LOWER EXTREMITY LIMITED TECHNIQUE: Ultrasound examination of the lower extremity soft tissues was performed in the area of clinical concern. COMPARISON:  CT scan of the abdomen and pelvis dated 02/14/2017 FINDINGS: There is Corbett Moulder 2.0 x 1.5 x 1.1 cm poorly defined irregular area within the subcutaneous fat of the medial aspect of the upper left thigh and lower left buttock with increased vascularity in and around this area. No discrete focal fluid collection. IMPRESSION: Findings are consistent with focal cellulitis without Caeleb Batalla definable abscess at this time. The appearance is not typical for tumor. Electronically Signed   By: Lorriane Shire  M.D.   On: 04/18/2017 11:26    Microbiology: Recent Results (from the past 240 hour(s))  Culture, blood (routine x 2)     Status: None (Preliminary result)   Collection Time: 04/18/17  4:36 PM  Result Value Ref Range Status   Specimen Description BLOOD LEFT HAND  Final   Special Requests   Final    BOTTLES DRAWN AEROBIC AND ANAEROBIC Blood Culture adequate volume   Culture NO GROWTH < 24 HOURS  Final   Report Status PENDING  Incomplete  Culture, blood (routine x 2)     Status: None (Preliminary result)   Collection Time: 04/18/17  4:41 PM  Result Value Ref Range Status   Specimen Description RIGHT ANTECUBITAL  Final   Special Requests   Final    BOTTLES DRAWN AEROBIC AND ANAEROBIC Blood Culture adequate volume   Culture NO GROWTH < 24 HOURS  Final   Report Status PENDING  Incomplete     Labs: Basic Metabolic Panel: Recent Labs  Lab 04/16/17 1140 04/17/17 0546 04/18/17 0714 04/19/17 0440  NA 138 137 137 136  K 3.9 4.4 4.5 4.2  CL 103 109 108 106  CO2 24 21* 21* 21*  GLUCOSE 106* 91 87 115*  BUN 20 18 14 17   CREATININE 2.45* 1.94* 1.88* 1.94*  CALCIUM 9.6 8.8* 8.9 9.0   Liver Function Tests: Recent Labs  Lab 04/16/17 1205 04/17/17 0546  AST 17 15  ALT 9* 9*  ALKPHOS 64 54  BILITOT 0.5 0.3  PROT 7.5 6.0*  ALBUMIN 3.3* 2.7*   Recent Labs  Lab 04/16/17 1205  LIPASE 59*   No results for input(s): AMMONIA in the last 168 hours. CBC: Recent Labs  Lab 04/16/17 1140 04/17/17 0546 04/18/17 0714 04/18/17 0859 04/19/17 0440  WBC 5.6 5.3 4.4  --  3.6*  NEUTROABS  --   --   --  2.6  --   HGB 10.9* 9.4* 9.1*  --  8.7*  HCT 34.1* 29.3* 28.4*  --  27.4*  MCV 92.4 92.7 92.8  --  92.6  PLT 359 313 253  --  260   Cardiac Enzymes: Recent Labs  Lab 04/16/17 1140 04/16/17 1811 04/16/17 2328 04/17/17 0546  TROPONINI <0.03 <0.03 <0.03 <0.03   BNP: BNP (last 3 results) Recent Labs    04/16/17 1811  BNP 20.0    ProBNP (last 3 results) No results for  input(s): PROBNP in the last 8760 hours.  CBG: Recent Labs  Lab 04/17/17 0809 04/18/17 0808 04/19/17 0758  GLUCAP 85 76 95       Signed:  Fayrene Helper MD.  [facility] 04/19/2017, 6:14 PM

## 2017-04-19 NOTE — Progress Notes (Signed)
Initial Nutrition Assessment  DOCUMENTATION CODES:   Non-severe (moderate) malnutrition in context of chronic illness  INTERVENTION:   Increase Ensure Enlive po to TID, each supplement provides 350 kcal and 20 grams of protein  NUTRITION DIAGNOSIS:   Moderate Malnutrition related to chronic illness, cancer and cancer related treatments as evidenced by mild fat depletion, mild muscle depletion.  GOAL:   Patient will meet greater than or equal to 90% of their needs  MONITOR:   PO intake, Supplement acceptance  REASON FOR ASSESSMENT:   Malnutrition Screening Tool    ASSESSMENT:   Pt with PMH of HTN, CKD, thyroid mass, and stage IV cervical cancer receiving chemotherapy presents with headache and SOB found AKI   Pt reports a decrease in appetite for the past couple months which has been heightened since the initiation of treatment. Pt reports attempting to consume 3 small meals and 3-4 Ensures per day. Pt reports taste changes, that all food tastes "bland". Per chart, pt is consuming 0-75% of meals at this time.   Pt endorses weight loss, reporting a past UBW of 180 lbs, states she weighed this in "early summer of this year". Per chart pt continues to lose weight, 5.6% weight loss in past 2 months.   Pt reports no nutrition impact symptoms at this time, however, states she required an enema (soap sud) for her constipation.   Labs reviewed; Hemoglobin 8.7 Medications reviewed; Phoslo, ferrous sulfate, Prozac, Senokot, Miralax  NUTRITION - FOCUSED PHYSICAL EXAM:    Most Recent Value  Orbital Region  Mild depletion  Upper Arm Region  Mild depletion  Thoracic and Lumbar Region  No depletion  Buccal Region  No depletion  Temple Region  Mild depletion  Clavicle Bone Region  Mild depletion  Clavicle and Acromion Bone Region  Moderate depletion  Scapular Bone Region  Unable to assess  Dorsal Hand  Unable to assess  Patellar Region  Mild depletion  Anterior Thigh Region  Mild  depletion  Posterior Calf Region  Moderate depletion  Edema (RD Assessment)  Mild       Diet Order:  Diet regular Room service appropriate? Yes; Fluid consistency: Thin  EDUCATION NEEDS:   No education needs have been identified at this time  Skin:  Skin Assessment: Reviewed RN Assessment  Last BM:  04/18/17  Height:   Ht Readings from Last 1 Encounters:  04/16/17 5\' 9"  (1.753 m)    Weight:   Wt Readings from Last 1 Encounters:  04/16/17 135 lb 9.6 oz (61.5 kg)    Ideal Body Weight:  65.9 kg  BMI:  Body mass index is 20.02 kg/m.  Estimated Nutritional Needs:   Kcal:  1845-2030  Protein:  85-95 grams  Fluid:  >/= 1.8 L/d  Parks Ranger, MS, RDN, LDN 04/19/2017 11:11 AM

## 2017-04-19 NOTE — Progress Notes (Signed)
Discharge instructions read to patient.  Patient verbalized understanding of all instructions. Discharged to home with family. 

## 2017-04-20 LAB — URINE CULTURE: CULTURE: NO GROWTH

## 2017-04-23 LAB — CULTURE, BLOOD (ROUTINE X 2)
CULTURE: NO GROWTH
Culture: NO GROWTH
SPECIAL REQUESTS: ADEQUATE
Special Requests: ADEQUATE

## 2019-02-04 ENCOUNTER — Emergency Department (HOSPITAL_COMMUNITY): Payer: Medicaid Other

## 2019-02-04 ENCOUNTER — Emergency Department (HOSPITAL_COMMUNITY)
Admission: EM | Admit: 2019-02-04 | Discharge: 2019-02-04 | Disposition: A | Discharge status: Home / Self Care | Payer: Medicaid Other | Attending: Emergency Medicine | Admitting: Emergency Medicine

## 2019-02-04 ENCOUNTER — Encounter (HOSPITAL_COMMUNITY): Payer: Self-pay | Admitting: Emergency Medicine

## 2019-02-04 ENCOUNTER — Other Ambulatory Visit: Payer: Self-pay

## 2019-02-04 DIAGNOSIS — R1030 Lower abdominal pain, unspecified: Secondary | ICD-10-CM

## 2019-02-04 DIAGNOSIS — F17210 Nicotine dependence, cigarettes, uncomplicated: Secondary | ICD-10-CM | POA: Diagnosis not present

## 2019-02-04 LAB — URINALYSIS, ROUTINE W REFLEX MICROSCOPIC
Bilirubin Urine: NEGATIVE
Glucose, UA: NEGATIVE mg/dL
Ketones, ur: NEGATIVE mg/dL
Nitrite: NEGATIVE
Protein, ur: NEGATIVE mg/dL
Specific Gravity, Urine: 1.009 (ref 1.005–1.030)
pH: 5 (ref 5.0–8.0)

## 2019-02-04 LAB — LIPASE, BLOOD: Lipase: 36 U/L (ref 11–51)

## 2019-02-04 LAB — CBC
HCT: 35.3 % — ABNORMAL LOW (ref 36.0–46.0)
Hemoglobin: 10.8 g/dL — ABNORMAL LOW (ref 12.0–15.0)
MCH: 29.6 pg (ref 26.0–34.0)
MCHC: 30.6 g/dL (ref 30.0–36.0)
MCV: 96.7 fL (ref 80.0–100.0)
Platelets: 269 10*3/uL (ref 150–400)
RBC: 3.65 MIL/uL — ABNORMAL LOW (ref 3.87–5.11)
RDW: 14.1 % (ref 11.5–15.5)
WBC: 8.1 10*3/uL (ref 4.0–10.5)
nRBC: 0 % (ref 0.0–0.2)

## 2019-02-04 LAB — COMPREHENSIVE METABOLIC PANEL
ALT: 22 U/L (ref 0–44)
AST: 22 U/L (ref 15–41)
Albumin: 3.4 g/dL — ABNORMAL LOW (ref 3.5–5.0)
Alkaline Phosphatase: 84 U/L (ref 38–126)
Anion gap: 12 (ref 5–15)
BUN: 29 mg/dL — ABNORMAL HIGH (ref 6–20)
CO2: 23 mmol/L (ref 22–32)
Calcium: 9.4 mg/dL (ref 8.9–10.3)
Chloride: 100 mmol/L (ref 98–111)
Creatinine, Ser: 2.48 mg/dL — ABNORMAL HIGH (ref 0.44–1.00)
GFR calc Af Amer: 25 mL/min — ABNORMAL LOW (ref >60)
GFR calc non Af Amer: 21 mL/min — ABNORMAL LOW (ref >60)
Glucose, Bld: 106 mg/dL — ABNORMAL HIGH (ref 70–99)
Potassium: 4 mmol/L (ref 3.5–5.1)
Sodium: 135 mmol/L (ref 135–145)
Total Bilirubin: 0.5 mg/dL (ref 0.3–1.2)
Total Protein: 7.5 g/dL (ref 6.5–8.1)

## 2019-02-04 MED ORDER — ONDANSETRON HCL 4 MG/2ML IJ SOLN
4.0000 mg | Freq: Once | INTRAMUSCULAR | Status: AC
  Administered 2019-02-04: 12:00:00 4 mg via INTRAVENOUS
  Filled 2019-02-04: qty 2

## 2019-02-04 MED ORDER — OXYCODONE-ACETAMINOPHEN 5-325 MG PO TABS: 1.0000 | ORAL_TABLET | Freq: Four times a day (QID) | ORAL | 0 refills | Status: DC | PRN

## 2019-02-04 MED ORDER — SODIUM CHLORIDE 0.9 % IV BOLUS
1000.0000 mL | Freq: Once | INTRAVENOUS | Status: AC
  Administered 2019-02-04: 12:00:00 1000 mL via INTRAVENOUS

## 2019-02-04 MED ORDER — SODIUM CHLORIDE 0.9% FLUSH: 3.0000 mL | Freq: Once | INTRAVENOUS | Status: DC

## 2019-02-04 MED ORDER — IOHEXOL 300 MG/ML  SOLN: 30.0000 mL | Freq: Once | INTRAMUSCULAR | Status: DC | PRN

## 2019-02-04 MED ORDER — MORPHINE SULFATE (PF) 4 MG/ML IV SOLN
4.0000 mg | Freq: Once | INTRAVENOUS | Status: AC
  Administered 2019-02-04: 12:00:00 4 mg via INTRAVENOUS
  Filled 2019-02-04: qty 1

## 2019-02-04 NOTE — ED Triage Notes (Addendum)
Per EMS patient complains of lower abdominal pain. States history of cervical cancer. Patient states able to void but is taking chemo every three week at [facility].

## 2019-02-04 NOTE — ED Provider Notes (Signed)
Emergency Department Provider Note   I have reviewed the triage vital signs and the nursing notes.   HISTORY  Chief Complaint Abdominal Pain   HPI Tanya Wright is a 53 y.o. female with PMH of HTN, cervical cancer on chemo followed by [facility], and right ureteral stent placement presents to the emergency department for evaluation of lower abdominal pain which began yesterday.  Patient states that she has been on chemotherapy [facility].  She never required surgery and completed multiple rounds of both chemotherapy and radiation.  She is not experiencing fevers or shaking chills.  Yesterday she developed lower abdominal pain that is nonradiating.  No other modifying factors.  She continues to urinate and have bowel movements normally.  No vomiting.  She reports being concerned because she had similar pain when she was diagnosed with cervical cancer in 2018.    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  Social History Social History   Tobacco Use   Smoking status: Current Some Day Smoker    Types: Cigarettes   Smokeless tobacco: Never Used  Substance Use Topics   Alcohol use: Yes   Drug use: Yes    Types: Marijuana     Review of Systems  Constitutional: No fever/chills Eyes: No visual changes. ENT: No sore throat. Cardiovascular: Denies chest pain. Respiratory: Denies shortness of breath. Gastrointestinal: Positive lower abdominal pain.  No nausea, no vomiting.  No diarrhea.  No constipation. Genitourinary: Negative for dysuria. Musculoskeletal: Negative for back pain. Skin: Negative for rash. Neurological: Negative for headaches, focal weakness or numbness.  10-point ROS otherwise negative.  ____________________________________________   PHYSICAL EXAM:  VITAL SIGNS: ED Triage Vitals [02/04/19 0957]  Enc Vitals Group     BP 109/85     Pulse Rate (!) 108     Resp      Temp 98.9 F (37.2 C)     Temp Source Oral     SpO2 100 %   Constitutional: Alert and oriented. Well appearing and in no acute distress. Eyes: Conjunctivae are normal. Head: Atraumatic. Nose: No congestion/rhinnorhea. Mouth/Throat: Mucous membranes are moist.   Neck: No stridor.  Cardiovascular: Tachycardia. Good peripheral circulation. Grossly normal heart sounds.   Respiratory: Normal respiratory effort.  No retractions. Lungs CTAB. Gastrointestinal: Soft with focal tenderness in the suprapubic and LLQ region. Mild tenderness in the LUQ. No rebound or guarding. No tenderness in additional quadrants. No distention.  Musculoskeletal: No lower extremity tenderness nor edema. No gross deformities of extremities. Neurologic:  Normal speech and language.  Skin:  Skin is warm, dry and intact. No rash noted.  ____________________________________________   LABS (all labs ordered are listed, but only  abnormal results are displayed)  Labs Reviewed  COMPREHENSIVE METABOLIC PANEL - Abnormal; Notable for the following components:      Result Value   Glucose, Bld 106 (*)    BUN 29 (*)    Creatinine, Ser 2.48 (*)    Albumin 3.4 (*)    GFR calc non Af Amer 21 (*)    GFR calc Af Amer 25 (*)    All other components within  normal limits  CBC - Abnormal; Notable for the following components:   RBC 3.65 (*)    Hemoglobin 10.8 (*)    HCT 35.3 (*)    All other components within normal limits  URINALYSIS, ROUTINE W REFLEX MICROSCOPIC - Abnormal; Notable for the following components:   APPearance HAZY (*)    Hgb urine dipstick MODERATE (*)    Leukocytes,Ua LARGE (*)    Bacteria, UA RARE (*)    All other components within normal limits  URINE CULTURE  LIPASE, BLOOD   ____________________________________________  RADIOLOGY  Ct Abdomen Pelvis Wo Contrast  Result Date: 02/04/2019 CLINICAL DATA:  53 year old female with history of acute onset of abdominal pain, generalized, but worse in the lower abdomen. History of cervical cancer. EXAM: CT ABDOMEN AND PELVIS WITHOUT CONTRAST TECHNIQUE: Multidetector CT imaging of the abdomen and pelvis was performed following the standard protocol without IV contrast. COMPARISON:  CT the abdomen and pelvis 04/16/2017. FINDINGS: Lower chest: Atherosclerotic calcifications in the right coronary artery. Hepatobiliary: No definite suspicious cystic or solid hepatic lesions are confidently identified on today's noncontrast CT examination. Ill-defined calcifications in the right lobe of the liver are of uncertain etiology and significance, but potentially related to remote trauma. Unenhanced appearance of the gallbladder is normal. Pancreas: No definite pancreatic mass or peripancreatic fluid collections or inflammatory changes are noted on today's noncontrast CT examination. Spleen: Unremarkable. Adrenals/Urinary Tract: Bilateral double-J ureteral stents in position with the distal loops reformed in the urinary bladder and proximal loops reformed in the renal pelvises bilaterally. Moderate bilateral hydronephrosis. Unenhanced appearance of the kidneys is otherwise unremarkable. Bilateral adrenal glands are normal in appearance. Urinary bladder is unremarkable in appearance. Stomach/Bowel:  Unenhanced appearance of the stomach is normal. No pathologic dilatation of small bowel or colon. Normal appendix. Vascular/Lymphatic: Aortic atherosclerosis. Multiple densely calcified bilateral pelvic and retroperitoneal lymph nodes, similar to the prior examination, with the largest of these in the right common iliac nodal station measuring 2 cm in short axis. No other definite lymphadenopathy confidently identified on today's noncontrast CT examination. Reproductive: Large amount of low-intermediate attenuation material in the endometrial canal which measures up to 3.6 cm (sagittal image 57 of series 6). Unenhanced appearance of the ovaries is unremarkable. Other: Trace volume of ascites.  No pneumoperitoneum. Musculoskeletal: There are no aggressive appearing lytic or blastic lesions noted in the visualized portions of the skeleton. IMPRESSION: 1. No acute findings are noted in the abdomen or pelvis to account for the patient's symptoms. 2. Large amount of low-intermediate attenuation material in the endometrial canal. Further evaluation with pelvic ultrasound is recommended to evaluate for potential endometrial neoplasm or direct invasion from cervical carcinoma. 3. Trace volume of ascites. 4. Multiple densely calcified bilateral pelvic and retroperitoneal lymph nodes, similar to the prior study, presumably from treated cervical carcinoma. 5. Aortic atherosclerosis, in addition to least right coronary artery disease. Please note that although the presence of coronary artery calcium documents the presence of coronary artery disease, the severity of this disease and any potential stenosis cannot be assessed  on this non-gated CT examination. Assessment for potential risk factor modification, dietary therapy or pharmacologic therapy may be warranted, if clinically indicated. Electronically Signed   By: Vinnie Langton M.D.   On: 02/04/2019 14:55     ____________________________________________   PROCEDURES  Procedure(s) performed:   Procedures  None  ____________________________________________   INITIAL IMPRESSION / ASSESSMENT AND PLAN / ED COURSE  Pertinent labs & imaging results that were available during my care of the patient were reviewed by me and considered in my medical decision making (see chart for details).   Patient presents by EMS with lower abdominal pain worsening today.  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. Currently on Keytruda after initial primary chemotherapy ending in 2019. Ureteral stent exchange with Dr. Judy Pimple on 01/03/19 at [facility] per review of Care Everywhere.   Patient with CKD similar values in Care Everywhere at [facility].   CT with no acute findings. Discussed need for pelvic US via [place] at [facility]. She will call today for a follow up appointment. Discussed ED return precautions.  ____________________________________________  FINAL CLINICAL IMPRESSION(S) / ED DIAGNOSES  Final diagnoses:  Lower abdominal pain     MEDICATIONS GIVEN DURING THIS VISIT:  Medications  sodium chloride 0.9 % bolus 1,000 mL (0 mLs Intravenous Stopped 02/04/19 1407)  morphine 4 MG/ML injection 4 mg (4 mg Intravenous Given 02/04/19 1141)  ondansetron (ZOFRAN) injection 4 mg (4 mg Intravenous Given 02/04/19 1141)     NEW OUTPATIENT MEDICATIONS STARTED DURING THIS VISIT:  Discharge Medication List as of 02/04/2019  3:11 PM    START taking these medications   Details  oxyCODONE-acetaminophen (PERCOCET/ROXICET) 5-325 MG tablet Take 1 tablet by mouth every 6 (six) hours as needed for severe pain., Starting Tue 02/04/2019, Normal        Note:  This document was prepared using Dragon voice recognition software and may include unintentional dictation errors.  Nanda Quinton, MD, [facility]    Aldeen Riga, [facility], MD 02/05/19  [phone]

## 2019-02-04 NOTE — Discharge Instructions (Signed)
You were seen in the emergency department today with lower abdominal pain.  Your CT scan did not show anything emergent or requiring admission at this time.  Continue to follow with your outpatient oncologist for pelvic ultrasound.  I have written a small number of pain medications for home use.  You should still have several leftover from her prescription and September please call your oncologist at [facility] this afternoon to schedule follow-up.  Return to the emergency department any new or suddenly worsening symptoms.

## 2019-02-06 LAB — URINE CULTURE
Culture: <10000 — AB
Special Requests: NORMAL

## 2020-09-23 ENCOUNTER — Other Ambulatory Visit: Payer: Self-pay

## 2020-09-23 ENCOUNTER — Inpatient Hospital Stay (HOSPITAL_COMMUNITY)
Admission: EM | Admit: 2020-09-23 | Discharge: 2020-10-04 | DRG: 854 | Disposition: A | Discharge status: Skilled Nursing Facility | Payer: 59 | Attending: Internal Medicine | Admitting: Internal Medicine

## 2020-09-23 ENCOUNTER — Observation Stay (HOSPITAL_COMMUNITY): Payer: 59

## 2020-09-23 ENCOUNTER — Emergency Department (HOSPITAL_COMMUNITY): Payer: 59

## 2020-09-23 ENCOUNTER — Encounter (HOSPITAL_COMMUNITY): Payer: Self-pay | Admitting: *Deleted

## 2020-09-23 DIAGNOSIS — Z20822 Contact with and (suspected) exposure to covid-19: Secondary | ICD-10-CM | POA: Diagnosis present

## 2020-09-23 DIAGNOSIS — Z923 Personal history of irradiation: Secondary | ICD-10-CM

## 2020-09-23 DIAGNOSIS — F1290 Cannabis use, unspecified, uncomplicated: Secondary | ICD-10-CM | POA: Diagnosis present

## 2020-09-23 DIAGNOSIS — I700 Atherosclerosis of aorta: Secondary | ICD-10-CM | POA: Diagnosis present

## 2020-09-23 DIAGNOSIS — R300 Dysuria: Secondary | ICD-10-CM | POA: Diagnosis present

## 2020-09-23 DIAGNOSIS — A419 Sepsis, unspecified organism: Secondary | ICD-10-CM | POA: Diagnosis present

## 2020-09-23 DIAGNOSIS — R3915 Urgency of urination: Secondary | ICD-10-CM | POA: Diagnosis present

## 2020-09-23 DIAGNOSIS — Z8541 Personal history of malignant neoplasm of cervix uteri: Secondary | ICD-10-CM

## 2020-09-23 DIAGNOSIS — E890 Postprocedural hypothyroidism: Secondary | ICD-10-CM | POA: Diagnosis present

## 2020-09-23 DIAGNOSIS — C539 Malignant neoplasm of cervix uteri, unspecified: Secondary | ICD-10-CM | POA: Diagnosis not present

## 2020-09-23 DIAGNOSIS — F32A Depression, unspecified: Secondary | ICD-10-CM | POA: Diagnosis present

## 2020-09-23 DIAGNOSIS — Z79890 Hormone replacement therapy (postmenopausal): Secondary | ICD-10-CM

## 2020-09-23 DIAGNOSIS — Z885 Allergy status to narcotic agent status: Secondary | ICD-10-CM | POA: Diagnosis not present

## 2020-09-23 DIAGNOSIS — D631 Anemia in chronic kidney disease: Secondary | ICD-10-CM | POA: Diagnosis present

## 2020-09-23 DIAGNOSIS — N390 Urinary tract infection, site not specified: Secondary | ICD-10-CM | POA: Diagnosis not present

## 2020-09-23 DIAGNOSIS — R6520 Severe sepsis without septic shock: Secondary | ICD-10-CM

## 2020-09-23 DIAGNOSIS — Z9851 Tubal ligation status: Secondary | ICD-10-CM

## 2020-09-23 DIAGNOSIS — N179 Acute kidney failure, unspecified: Secondary | ICD-10-CM | POA: Diagnosis present

## 2020-09-23 DIAGNOSIS — N3001 Acute cystitis with hematuria: Secondary | ICD-10-CM

## 2020-09-23 DIAGNOSIS — A4101 Sepsis due to Methicillin susceptible Staphylococcus aureus: Secondary | ICD-10-CM | POA: Diagnosis present

## 2020-09-23 DIAGNOSIS — N1831 Chronic kidney disease, stage 3a: Secondary | ICD-10-CM

## 2020-09-23 DIAGNOSIS — R000 Tachycardia, unspecified: Secondary | ICD-10-CM | POA: Diagnosis present

## 2020-09-23 DIAGNOSIS — I890 Lymphedema, not elsewhere classified: Secondary | ICD-10-CM | POA: Diagnosis present

## 2020-09-23 DIAGNOSIS — N183 Chronic kidney disease, stage 3 unspecified: Secondary | ICD-10-CM | POA: Diagnosis present

## 2020-09-23 DIAGNOSIS — G894 Chronic pain syndrome: Secondary | ICD-10-CM | POA: Diagnosis present

## 2020-09-23 DIAGNOSIS — I959 Hypotension, unspecified: Secondary | ICD-10-CM | POA: Diagnosis not present

## 2020-09-23 DIAGNOSIS — N1330 Unspecified hydronephrosis: Secondary | ICD-10-CM | POA: Diagnosis not present

## 2020-09-23 DIAGNOSIS — R7881 Bacteremia: Secondary | ICD-10-CM | POA: Diagnosis not present

## 2020-09-23 DIAGNOSIS — I129 Hypertensive chronic kidney disease with stage 1 through stage 4 chronic kidney disease, or unspecified chronic kidney disease: Secondary | ICD-10-CM | POA: Diagnosis present

## 2020-09-23 DIAGNOSIS — N136 Pyonephrosis: Secondary | ICD-10-CM | POA: Diagnosis present

## 2020-09-23 DIAGNOSIS — B9561 Methicillin susceptible Staphylococcus aureus infection as the cause of diseases classified elsewhere: Secondary | ICD-10-CM | POA: Diagnosis not present

## 2020-09-23 DIAGNOSIS — Z79899 Other long term (current) drug therapy: Secondary | ICD-10-CM

## 2020-09-23 DIAGNOSIS — D849 Immunodeficiency, unspecified: Secondary | ICD-10-CM | POA: Diagnosis present

## 2020-09-23 DIAGNOSIS — N189 Chronic kidney disease, unspecified: Secondary | ICD-10-CM | POA: Diagnosis present

## 2020-09-23 DIAGNOSIS — Z87891 Personal history of nicotine dependence: Secondary | ICD-10-CM

## 2020-09-23 DIAGNOSIS — N1832 Chronic kidney disease, stage 3b: Secondary | ICD-10-CM | POA: Diagnosis present

## 2020-09-23 DIAGNOSIS — Z960 Presence of urogenital implants: Secondary | ICD-10-CM | POA: Diagnosis present

## 2020-09-23 DIAGNOSIS — N1339 Other hydronephrosis: Secondary | ICD-10-CM | POA: Diagnosis not present

## 2020-09-23 DIAGNOSIS — D638 Anemia in other chronic diseases classified elsewhere: Secondary | ICD-10-CM | POA: Diagnosis present

## 2020-09-23 DIAGNOSIS — N10 Acute tubulo-interstitial nephritis: Secondary | ICD-10-CM | POA: Diagnosis not present

## 2020-09-23 DIAGNOSIS — Y842 Radiological procedure and radiotherapy as the cause of abnormal reaction of the patient, or of later complication, without mention of misadventure at the time of the procedure: Secondary | ICD-10-CM | POA: Diagnosis present

## 2020-09-23 DIAGNOSIS — Z95828 Presence of other vascular implants and grafts: Secondary | ICD-10-CM

## 2020-09-23 DIAGNOSIS — I340 Nonrheumatic mitral (valve) insufficiency: Secondary | ICD-10-CM | POA: Diagnosis not present

## 2020-09-23 HISTORY — DX: Malignant (primary) neoplasm, unspecified: C80.1

## 2020-09-23 HISTORY — DX: Malignant neoplasm of cervix uteri, unspecified: C53.9

## 2020-09-23 LAB — COMPREHENSIVE METABOLIC PANEL
ALT: 23 U/L (ref 0–44)
AST: 26 U/L (ref 15–41)
Albumin: 2.8 g/dL — ABNORMAL LOW (ref 3.5–5.0)
Alkaline Phosphatase: 67 U/L (ref 38–126)
Anion gap: 9 (ref 5–15)
BUN: 40 mg/dL — ABNORMAL HIGH (ref 6–20)
CO2: 24 mmol/L (ref 22–32)
Calcium: 7.6 mg/dL — ABNORMAL LOW (ref 8.9–10.3)
Chloride: 101 mmol/L (ref 98–111)
Creatinine, Ser: 3 mg/dL — ABNORMAL HIGH (ref 0.44–1.00)
GFR, Estimated: 18 mL/min — ABNORMAL LOW (ref >60)
Glucose, Bld: 134 mg/dL — ABNORMAL HIGH (ref 70–99)
Potassium: 4.4 mmol/L (ref 3.5–5.1)
Sodium: 134 mmol/L — ABNORMAL LOW (ref 135–145)
Total Bilirubin: 1.1 mg/dL (ref 0.3–1.2)
Total Protein: 6.6 g/dL (ref 6.5–8.1)

## 2020-09-23 LAB — URINALYSIS, ROUTINE W REFLEX MICROSCOPIC
Bacteria, UA: NONE SEEN
Bilirubin Urine: NEGATIVE
Glucose, UA: NEGATIVE mg/dL
Ketones, ur: NEGATIVE mg/dL
Nitrite: NEGATIVE
Protein, ur: >=300 mg/dL — AB
RBC / HPF: >50 RBC/hpf — ABNORMAL HIGH (ref 0–5)
Specific Gravity, Urine: 1.013 (ref 1.005–1.030)
WBC, UA: >50 WBC/hpf — ABNORMAL HIGH (ref 0–5)
pH: 7 (ref 5.0–8.0)

## 2020-09-23 LAB — RESP PANEL BY RT-PCR (FLU A&B, COVID) ARPGX2
Influenza A by PCR: NEGATIVE
Influenza B by PCR: NEGATIVE
SARS Coronavirus 2 by RT PCR: NEGATIVE

## 2020-09-23 LAB — CBC WITH DIFFERENTIAL/PLATELET
Abs Immature Granulocytes: 0.16 10*3/uL — ABNORMAL HIGH (ref 0.00–0.07)
Basophils Absolute: 0 10*3/uL (ref 0.0–0.1)
Basophils Relative: 0 %
Eosinophils Absolute: 0 10*3/uL (ref 0.0–0.5)
Eosinophils Relative: 0 %
HCT: 34.2 % — ABNORMAL LOW (ref 36.0–46.0)
Hemoglobin: 11 g/dL — ABNORMAL LOW (ref 12.0–15.0)
Immature Granulocytes: 1 %
Lymphocytes Relative: 3 %
Lymphs Abs: 0.3 10*3/uL — ABNORMAL LOW (ref 0.7–4.0)
MCH: 31.3 pg (ref 26.0–34.0)
MCHC: 32.2 g/dL (ref 30.0–36.0)
MCV: 97.4 fL (ref 80.0–100.0)
Monocytes Absolute: 0.6 10*3/uL (ref 0.1–1.0)
Monocytes Relative: 5 %
Neutro Abs: 10.1 10*3/uL — ABNORMAL HIGH (ref 1.7–7.7)
Neutrophils Relative %: 91 %
Platelets: 155 10*3/uL (ref 150–400)
RBC: 3.51 MIL/uL — ABNORMAL LOW (ref 3.87–5.11)
RDW: 15.1 % (ref 11.5–15.5)
WBC: 11.2 10*3/uL — ABNORMAL HIGH (ref 4.0–10.5)
nRBC: 0 % (ref 0.0–0.2)

## 2020-09-23 LAB — BASIC METABOLIC PANEL
Anion gap: 12 (ref 5–15)
BUN: 38 mg/dL — ABNORMAL HIGH (ref 6–20)
CO2: 24 mmol/L (ref 22–32)
Calcium: 8.2 mg/dL — ABNORMAL LOW (ref 8.9–10.3)
Chloride: 96 mmol/L — ABNORMAL LOW (ref 98–111)
Creatinine, Ser: 3.16 mg/dL — ABNORMAL HIGH (ref 0.44–1.00)
GFR, Estimated: 17 mL/min — ABNORMAL LOW (ref >60)
Glucose, Bld: 142 mg/dL — ABNORMAL HIGH (ref 70–99)
Potassium: 3.7 mmol/L (ref 3.5–5.1)
Sodium: 132 mmol/L — ABNORMAL LOW (ref 135–145)

## 2020-09-23 LAB — TROPONIN I (HIGH SENSITIVITY)
Troponin I (High Sensitivity): 8 ng/L (ref <18)
Troponin I (High Sensitivity): 9 ng/L (ref <18)
Troponin I (High Sensitivity): 11 ng/L (ref <18)

## 2020-09-23 LAB — CBC
HCT: 37.6 % (ref 36.0–46.0)
Hemoglobin: 12.4 g/dL (ref 12.0–15.0)
MCH: 32.1 pg (ref 26.0–34.0)
MCHC: 33 g/dL (ref 30.0–36.0)
MCV: 97.4 fL (ref 80.0–100.0)
Platelets: 171 10*3/uL (ref 150–400)
RBC: 3.86 MIL/uL — ABNORMAL LOW (ref 3.87–5.11)
RDW: 15.2 % (ref 11.5–15.5)
WBC: 11.3 10*3/uL — ABNORMAL HIGH (ref 4.0–10.5)
nRBC: 0 % (ref 0.0–0.2)

## 2020-09-23 LAB — LACTIC ACID, PLASMA
Lactic Acid, Venous: 1.1 mmol/L (ref 0.5–1.9)
Lactic Acid, Venous: 1 mmol/L (ref 0.5–1.9)

## 2020-09-23 LAB — PROTIME-INR
INR: 1.2 (ref 0.8–1.2)
Prothrombin Time: 14.8 seconds (ref 11.4–15.2)

## 2020-09-23 LAB — PREGNANCY, URINE: Preg Test, Ur: NEGATIVE

## 2020-09-23 LAB — APTT: aPTT: 27 seconds (ref 24–36)

## 2020-09-23 LAB — BRAIN NATRIURETIC PEPTIDE: B Natriuretic Peptide: 86 pg/mL (ref 0.0–100.0)

## 2020-09-23 MED ORDER — METOPROLOL TARTRATE 5 MG/5ML IV SOLN
5.0000 mg | INTRAVENOUS | Status: DC | PRN
  Administered 2020-09-24: 5 mg via INTRAVENOUS
  Filled 2020-09-23: qty 5

## 2020-09-23 MED ORDER — OXYCODONE ER 18 MG PO C12A: 1.0000 | EXTENDED_RELEASE_CAPSULE | Freq: Two times a day (BID) | ORAL | Status: DC

## 2020-09-23 MED ORDER — MAGNESIUM CITRATE PO SOLN: 1.0000 | Freq: Once | ORAL | Status: DC | PRN

## 2020-09-23 MED ORDER — SODIUM CHLORIDE 0.9 % IV SOLN: 1.0000 g | INTRAVENOUS | Status: DC

## 2020-09-23 MED ORDER — CHLORHEXIDINE GLUCONATE CLOTH 2 % EX PADS
6.0000 | MEDICATED_PAD | Freq: Every day | CUTANEOUS | Status: DC
  Administered 2020-09-24 – 2020-10-04 (×7): 6 via TOPICAL

## 2020-09-23 MED ORDER — BISACODYL 5 MG PO TBEC
5.0000 mg | DELAYED_RELEASE_TABLET | Freq: Every day | ORAL | Status: DC | PRN
  Administered 2020-10-03: 5 mg via ORAL
  Filled 2020-09-23: qty 1

## 2020-09-23 MED ORDER — OXYCODONE HCL 5 MG PO TABS
5.0000 mg | ORAL_TABLET | ORAL | Status: DC | PRN
  Administered 2020-09-25 – 2020-10-03 (×7): 5 mg via ORAL
  Filled 2020-09-23 (×8): qty 1

## 2020-09-23 MED ORDER — HYDROMORPHONE HCL 1 MG/ML IJ SOLN
0.5000 mg | INTRAMUSCULAR | Status: DC | PRN
  Administered 2020-09-23 – 2020-10-04 (×22): 1 mg via INTRAVENOUS
  Filled 2020-09-23 (×22): qty 1

## 2020-09-23 MED ORDER — SENNA 8.6 MG PO TABS
1.0000 | ORAL_TABLET | Freq: Every day | ORAL | Status: DC
  Administered 2020-09-23 – 2020-10-03 (×10): 8.6 mg via ORAL
  Filled 2020-09-23 (×11): qty 1

## 2020-09-23 MED ORDER — SODIUM CHLORIDE 0.9 % IV SOLN
250.0000 mL | INTRAVENOUS | Status: DC | PRN
  Administered 2020-09-27: 250 mL via INTRAVENOUS

## 2020-09-23 MED ORDER — ACETAMINOPHEN 650 MG RE SUPP
650.0000 mg | Freq: Four times a day (QID) | RECTAL | Status: DC | PRN
  Administered 2020-09-28 – 2020-09-29 (×3): 650 mg via RECTAL
  Filled 2020-09-23 (×3): qty 1

## 2020-09-23 MED ORDER — SODIUM CHLORIDE 0.9% FLUSH
3.0000 mL | Freq: Two times a day (BID) | INTRAVENOUS | Status: DC
  Administered 2020-09-23 – 2020-09-28 (×9): 3 mL via INTRAVENOUS

## 2020-09-23 MED ORDER — SODIUM CHLORIDE 0.9 % IV SOLN
2.0000 g | INTRAVENOUS | Status: DC
  Administered 2020-09-23 – 2020-09-25 (×3): 2 g via INTRAVENOUS
  Filled 2020-09-23 (×3): qty 20

## 2020-09-23 MED ORDER — OXYCODONE HCL ER 10 MG PO T12A
10.0000 mg | EXTENDED_RELEASE_TABLET | Freq: Two times a day (BID) | ORAL | Status: DC
  Administered 2020-09-23 – 2020-10-04 (×18): 10 mg via ORAL
  Filled 2020-09-23 (×21): qty 1

## 2020-09-23 MED ORDER — ACETAMINOPHEN 325 MG PO TABS
650.0000 mg | ORAL_TABLET | Freq: Four times a day (QID) | ORAL | Status: DC | PRN
  Administered 2020-09-23 – 2020-10-02 (×13): 650 mg via ORAL
  Filled 2020-09-23 (×13): qty 2

## 2020-09-23 MED ORDER — LEVOTHYROXINE SODIUM 75 MCG PO TABS
175.0000 ug | ORAL_TABLET | Freq: Every day | ORAL | Status: DC
  Administered 2020-09-24 – 2020-10-04 (×8): 175 ug via ORAL
  Filled 2020-09-23 (×8): qty 1

## 2020-09-23 MED ORDER — SODIUM CHLORIDE 0.9% FLUSH
3.0000 mL | INTRAVENOUS | Status: DC | PRN
  Administered 2020-09-28 – 2020-10-02 (×2): 3 mL via INTRAVENOUS

## 2020-09-23 MED ORDER — LACTATED RINGERS IV BOLUS (SEPSIS)
1000.0000 mL | Freq: Once | INTRAVENOUS | Status: AC
  Administered 2020-09-23: 1000 mL via INTRAVENOUS

## 2020-09-23 MED ORDER — CALCIUM ACETATE (PHOS BINDER) 667 MG PO CAPS
1334.0000 mg | ORAL_CAPSULE | Freq: Three times a day (TID) | ORAL | Status: DC
  Administered 2020-09-23 – 2020-10-04 (×27): 1334 mg via ORAL
  Filled 2020-09-23 (×28): qty 2

## 2020-09-23 MED ORDER — HEPARIN SODIUM (PORCINE) 5000 UNIT/ML IJ SOLN
5000.0000 [IU] | Freq: Three times a day (TID) | INTRAMUSCULAR | Status: DC
  Administered 2020-09-23 – 2020-10-04 (×30): 5000 [IU] via SUBCUTANEOUS
  Filled 2020-09-23 (×30): qty 1

## 2020-09-23 MED ORDER — SODIUM CHLORIDE 0.9 % IV BOLUS
1000.0000 mL | Freq: Once | INTRAVENOUS | Status: AC
  Administered 2020-09-23: 1000 mL via INTRAVENOUS

## 2020-09-23 MED ORDER — TRAZODONE HCL 50 MG PO TABS
25.0000 mg | ORAL_TABLET | Freq: Every evening | ORAL | Status: DC | PRN
  Administered 2020-09-25: 25 mg via ORAL
  Filled 2020-09-23: qty 1

## 2020-09-23 MED ORDER — SODIUM CHLORIDE 0.9% FLUSH
3.0000 mL | Freq: Two times a day (BID) | INTRAVENOUS | Status: DC
  Administered 2020-09-23 – 2020-10-04 (×16): 3 mL via INTRAVENOUS

## 2020-09-23 MED ORDER — POLYETHYLENE GLYCOL 3350 17 G PO PACK
17.0000 g | PACK | Freq: Every day | ORAL | Status: DC
  Administered 2020-09-24 – 2020-10-04 (×8): 17 g via ORAL
  Filled 2020-09-23 (×8): qty 1

## 2020-09-23 MED ORDER — METOPROLOL TARTRATE 5 MG/5ML IV SOLN
5.0000 mg | Freq: Once | INTRAVENOUS | Status: AC
  Administered 2020-09-23: 5 mg via INTRAVENOUS
  Filled 2020-09-23: qty 5

## 2020-09-23 MED ORDER — ONDANSETRON HCL 4 MG/2ML IJ SOLN
4.0000 mg | Freq: Four times a day (QID) | INTRAMUSCULAR | Status: DC | PRN
  Administered 2020-09-29 – 2020-10-01 (×2): 4 mg via INTRAVENOUS
  Filled 2020-09-23 (×2): qty 2

## 2020-09-23 MED ORDER — HYDRALAZINE HCL 20 MG/ML IJ SOLN: 10.0000 mg | INTRAMUSCULAR | Status: DC | PRN

## 2020-09-23 MED ORDER — OXYCODONE HCL 5 MG PO TABS: 5.0000 mg | ORAL_TABLET | ORAL | Status: DC | PRN

## 2020-09-23 MED ORDER — ONDANSETRON HCL 4 MG PO TABS
4.0000 mg | ORAL_TABLET | Freq: Four times a day (QID) | ORAL | Status: DC | PRN
  Administered 2020-09-26 – 2020-10-02 (×4): 4 mg via ORAL
  Filled 2020-09-23 (×4): qty 1

## 2020-09-23 MED ORDER — FLUOXETINE HCL 10 MG PO CAPS
10.0000 mg | ORAL_CAPSULE | Freq: Every day | ORAL | Status: DC
  Administered 2020-09-24 – 2020-10-04 (×9): 10 mg via ORAL
  Filled 2020-09-23 (×10): qty 1

## 2020-09-23 MED ORDER — LEVALBUTEROL HCL 0.63 MG/3ML IN NEBU: 0.6300 mg | INHALATION_SOLUTION | Freq: Four times a day (QID) | RESPIRATORY_TRACT | Status: DC | PRN

## 2020-09-23 MED ORDER — SODIUM CHLORIDE 0.9 % IV SOLN
1.0000 g | Freq: Once | INTRAVENOUS | Status: AC
  Administered 2020-09-23: 1 g via INTRAVENOUS
  Filled 2020-09-23: qty 10

## 2020-09-23 MED ORDER — SODIUM CHLORIDE 0.9 % IV SOLN: INTRAVENOUS | Status: AC

## 2020-09-23 MED ORDER — SENNOSIDES-DOCUSATE SODIUM 8.6-50 MG PO TABS: 1.0000 | ORAL_TABLET | Freq: Every evening | ORAL | Status: DC | PRN

## 2020-09-23 MED ORDER — IPRATROPIUM BROMIDE 0.02 % IN SOLN: 0.5000 mg | Freq: Four times a day (QID) | RESPIRATORY_TRACT | Status: DC | PRN

## 2020-09-23 NOTE — ED Notes (Signed)
reported that pt received 324 mg ASA en route

## 2020-09-23 NOTE — Progress Notes (Signed)
Notified bedside nurse of need to draw lactic acid and blood cultures.  

## 2020-09-23 NOTE — Progress Notes (Signed)
Elink following for code sepsis 

## 2020-09-23 NOTE — ED Notes (Signed)
Verbal orders from Dr. Erling Conte to give 2 Bolus Lactated Ringer now and stop NS continuous , an dto give another 2 gram of Rocephin to with the other dose earlier.

## 2020-09-23 NOTE — ED Notes (Signed)
PA at bedside.

## 2020-09-23 NOTE — ED Notes (Signed)
Attempted to call report again.

## 2020-09-23 NOTE — ED Notes (Addendum)
Pt c/o HA Pt receiving chemo q 6 wks for cancer

## 2020-09-23 NOTE — ED Triage Notes (Signed)
Pt called EMS x 2-first time requesting Covid test.  Then called EMS for second time for CP.  cbg 147, 18 G placed to rt [facility]

## 2020-09-23 NOTE — ED Notes (Addendum)
Ear hurt, chest hurt, vomiting and loose stools per pt. Pt last chemo was about 3 weeks ago.

## 2020-09-23 NOTE — ED Provider Notes (Signed)
Tanya Wright Provider Note   CSN: [id number] Arrival date & time: 09/23/20  1051     History Chief Complaint  Patient presents with  . Chest Pain    Tanya Wright is a 55 y.o. female.  HPI   Patient with significant medical history of cervical cancer stage V currently on immunotherapy, hypertension, thyroid disease, ureter stents presents to the emergency department with chief complaint of URI-like symptoms.  Patient states since Monday she has been experiencing fevers, chills, headaches, nasal congestion, general body aches, stomach pain, nausea, vomiting, diarrhea.  She denies recent sick contacts, is up-to-date on her COVID and her influenza vaccines.  She endorses that she has been unable to tolerate food but she is drinking water without any difficulty.  Patient also notes endorse right-sided chest pain, states the pain is intermittent, does worsen with arm movement or movement in general, she denies dyspnea on exertion, shortness of breath, nausea, vomiting, orthopnea or pedal edema.  Patient has no significant cardiac history, no history of PEs or DVTs, currently not on hormone therapy.  Patient denies any alleviating factors.  Past Medical History:  Diagnosis Date  . Cancer ([place])   . Cervical cancer ([place])   . Hypertension   . Renal disorder   . Thyroid disease     Patient Active Problem List   Diagnosis Date Noted  . Malnutrition of moderate degree 04/19/2017  . AKI (acute kidney injury) ([place]) 04/16/2017  . Abdominal pain 04/16/2017  . Anemia in other chronic diseases classified elsewhere 03/14/2017  . Anemia 03/14/2017  . CKD (chronic kidney disease) 02/20/2017  . UTI (urinary tract infection) 02/20/2017  . 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 Thyroid mass of unclear etiology 02/19/2017  . Cancer determined by uterine cervix biopsy ([place]) 02/19/2017    Past Surgical History:  Procedure Laterality Date  . STENT PLACEMENT RT URETER ([place] [facility])      sent in both kidneys for stones  . TUBAL LIGATION       OB History   No obstetric history on file.     History reviewed. No pertinent family history.  Social History   Tobacco Use  . Smoking status: Former Smoker    Types: Cigarettes  . Smokeless tobacco: Never Used  Substance Use Topics  . Alcohol use: Yes  . Drug use: Yes    Types: Marijuana    Home Medications Prior to Admission medications   Medication Sig Start Date End Date Taking? Authorizing Provider  calcium acetate (PHOSLO) 667 MG capsule Take 1,334 mg by mouth 3 (three) times daily with meals.   Yes [provider]  FLUoxetine (PROZAC) 10 MG capsule Take 10 mg by mouth daily. 03/02/17 09/23/20 Yes [provider]  levothyroxine (SYNTHROID) 175 MCG tablet Take 175 mcg by mouth daily. 09/01/20  Yes [provider]  oxyCODONE (OXY IR/ROXICODONE) 5 MG immediate release tablet Take 1 tablet by mouth every 4 (four) hours as needed. 09/10/20  Yes [provider]  polyethylene glycol (MIRALAX / GLYCOLAX) packet Take 17 g by mouth daily. 02/22/17  Yes [name], MD  senna (SENOKOT) 8.6 MG tablet Take 1 tablet by mouth at bedtime.   Yes [provider]  XTAMPZA ER 18 MG C12A Take 1 capsule by mouth 2 (two) times daily. 09/01/20  Yes [provider]  oxyCODONE-acetaminophen (PERCOCET/ROXICET) 5-325 MG tablet Take 1 tablet by mouth every 6 (six) hours as needed for severe pain. Patient not taking: No sig  reported 02/04/19   Long, Wonda Olds, MD  penicillin v potassium (VEETID) 500 MG tablet Take 1 tablet by mouth daily. Patient not taking: Reported on 09/23/2020 01/29/19   [provider]  SUMAtriptan (IMITREX) 25 MG tablet Take 1 tablet (25 mg total) by mouth once for 1 dose. Repeat in 2 hours if headache persists.  Do not take more than two days in a week. Patient not taking: Reported on 02/04/2019 04/19/17 04/19/17  Elodia Florence., MD    Allergies     Oxycodone  Review of Systems   Review of Systems  Constitutional: Positive for chills and fever.  HENT: Positive for congestion. Negative for sore throat.   Respiratory: Negative for shortness of breath.   Cardiovascular: Positive for chest pain.  Gastrointestinal: Positive for abdominal pain, diarrhea, nausea and vomiting.  Genitourinary: Positive for dysuria. Negative for enuresis.  Musculoskeletal: Positive for myalgias. Negative for back pain.  Skin: Negative for rash.  Neurological: Positive for headaches. Negative for dizziness.  Hematological: Does not bruise/bleed easily.    Physical Exam Updated Vital Signs BP (!) 124/104   Pulse (!) 104   Temp 99.2 F (37.3 C) (Oral)   Resp (!) 23   Wt 84.8 kg   LMP 02/28/2017   SpO2 97%   BMI 27.62 kg/m   Physical Exam Vitals and nursing note reviewed.  Constitutional:      General: She is not in acute distress.    Appearance: She is not ill-appearing.  HENT:     Head: Normocephalic and atraumatic.     Right Ear: Tympanic membrane normal.     Left Ear: Tympanic membrane normal.     Nose: No congestion.     Comments: Patient has noted erythematous turbinates    Mouth/Throat:     Mouth: Mucous membranes are moist.     Pharynx: Oropharynx is clear.  Eyes:     Conjunctiva/sclera: Conjunctivae normal.  Cardiovascular:     Rate and Rhythm: Normal rate and regular rhythm.     Pulses: Normal pulses.     Heart sounds: No murmur heard. No friction rub. No gallop.   Pulmonary:     Effort: No respiratory distress.     Breath sounds: No wheezing, rhonchi or rales.  Abdominal:     Palpations: Abdomen is soft.     Tenderness: There is no abdominal tenderness.  Musculoskeletal:     Right lower leg: No edema.     Left lower leg: No edema.  Skin:    General: Skin is warm and dry.  Neurological:     Mental Status: She is alert.  Psychiatric:        Mood and Affect: Mood normal.     ED Results / Procedures / Treatments    Labs (all labs ordered are listed, but only abnormal results are displayed) Labs Reviewed  BASIC METABOLIC PANEL - Abnormal; Notable for the following components:      Result Value   Sodium 132 (*)    Chloride 96 (*)    Glucose, Bld 142 (*)    BUN 38 (*)    Creatinine, Ser 3.16 (*)    Calcium 8.2 (*)    GFR, Estimated 17 (*)    All other components within normal limits  CBC - Abnormal; Notable for the following components:   WBC 11.3 (*)    RBC 3.86 (*)    All other components within normal limits  URINALYSIS, ROUTINE W REFLEX MICROSCOPIC - Abnormal;  Notable for the following components:   APPearance TURBID (*)    Hgb urine dipstick MODERATE (*)    Protein, ur >=300 (*)    Leukocytes,Ua LARGE (*)    RBC / HPF >50 (*)    WBC, UA >50 (*)    Non Squamous Epithelial 0-5 (*)    All other components within normal limits  RESP PANEL BY RT-PCR (FLU A&B, COVID) ARPGX2  URINE CULTURE  CULTURE, BLOOD (ROUTINE X 2)  CULTURE, BLOOD (ROUTINE X 2)  PREGNANCY, URINE  HIV ANTIBODY (ROUTINE TESTING W REFLEX)  BRAIN NATRIURETIC PEPTIDE  TROPONIN I (HIGH SENSITIVITY)  TROPONIN I (HIGH SENSITIVITY)    EKG EKG Interpretation  Date/Time:  Thursday September 23 2020 11:03:29 EDT Ventricular Rate:  99 PR Interval:  137 QRS Duration: 93 QT Interval:  369 QTC Calculation: 474 R Axis:   82 Text Interpretation: Sinus rhythm LAE, consider biatrial enlargement Left ventricular hypertrophy Nonspecific T abnrm, anterolateral leads Similar to 03/2017 tracing Confirmed by Nanda Quinton [phone]) on 09/23/2020 11:07:21 AM   Radiology DG Chest Portable 1 View  Result Date: 09/23/2020 CLINICAL DATA:  55 year old female with chest pain on the right. Fever, chills, headache. History of cervical cancer. EXAM: PORTABLE CHEST 1 VIEW COMPARISON:  Chest radiographs 04/16/2017. FINDINGS: Portable AP upright view at 1114 hours. Stable right chest Port-A-Cath. Allowing for portable technique lung volumes and mediastinal  contours remain within normal limits, lungs are clear. No pneumothorax. Interval thyroidectomy, with resolved rightward shift of the trachea at the thoracic inlet. No acute osseous abnormality identified. Paucity of bowel gas in the upper abdomen. IMPRESSION: No acute cardiopulmonary abnormality. Electronically Signed   By: Genevie Ann M.D.   On: 09/23/2020 11:21   CT Renal Stone Study  Result Date: 09/23/2020 CLINICAL DATA:  Right flank pain and hematuria for the past day. History of cervical cancer. EXAM: CT ABDOMEN AND PELVIS WITHOUT CONTRAST TECHNIQUE: Multidetector CT imaging of the abdomen and pelvis was performed following the standard protocol without IV contrast. COMPARISON:  CT abdomen pelvis dated February 04, 2019. FINDINGS: Lower chest: No acute abnormality. Hepatobiliary: No focal liver abnormality is seen. No gallstones, gallbladder wall thickening, or biliary dilatation. Pancreas: Unchanged small calcifications in the pancreatic head consistent with chronic pancreatitis. No ductal dilatation or surrounding inflammatory changes. Spleen: Normal in size without focal abnormality. Adrenals/Urinary Tract: Adrenal glands are unremarkable. Bilateral double-J ureteral stents again noted and appropriately positioned. Chronic bilateral hydronephrosis, mildly improved compared to prior study. No renal calculi. Mild circumferential bladder wall thickening. Stomach/Bowel: Stomach is within normal limits. Appendix appears normal. No evidence of bowel wall thickening, distention, or inflammatory changes. Vascular/Lymphatic: Chronic densely calcified retroperitoneal and pelvic lymphadenopathy, similar to prior study. Aortic atherosclerosis. Reproductive: Improved but continued distension of the endometrial canal, which measures up to 1.9 cm (series 6, image 55), previously 3.6 cm. No adnexal mass. Other: Trace free fluid in the pelvis.  No pneumoperitoneum. Musculoskeletal: No acute or significant osseous findings.  IMPRESSION: 1. No acute intra-abdominal process. 2. Bilateral double-J ureteral stents with chronic bilateral hydronephrosis, mildly improved compared to prior study. 3. Improved but continued distension of the endometrial canal, which measures up to 1.9 cm, previously 3.6 cm. This may reflect obstruction related to cervical cancer/post treatment changes. Consider pelvic ultrasound for further evaluation. 4. Chronic densely calcified retroperitoneal and pelvic lymphadenopathy, similar to prior study, consistent with treated metastatic disease. 5. Aortic Atherosclerosis (ICD10-I70.0). Electronically Signed   By: Titus Dubin M.D.   On: 09/23/2020 13:13  Procedures Procedures   Medications Ordered in ED Medications  heparin injection 5,000 Units (has no administration in time range)  sodium chloride flush (NS) 0.9 % injection 3 mL (has no administration in time range)  0.9 %  sodium chloride infusion (has no administration in time range)  sodium chloride flush (NS) 0.9 % injection 3 mL (has no administration in time range)  sodium chloride flush (NS) 0.9 % injection 3 mL (has no administration in time range)  0.9 %  sodium chloride infusion (has no administration in time range)  acetaminophen (TYLENOL) tablet 650 mg (has no administration in time range)    Or  acetaminophen (TYLENOL) suppository 650 mg (has no administration in time range)  HYDROmorphone (DILAUDID) injection 0.5-1 mg (has no administration in time range)  traZODone (DESYREL) tablet 25 mg (has no administration in time range)  senna-docusate (Senokot-S) tablet 1 tablet (has no administration in time range)  bisacodyl (DULCOLAX) EC tablet 5 mg (has no administration in time range)  magnesium citrate solution 1 Bottle (has no administration in time range)  ondansetron (ZOFRAN) tablet 4 mg (has no administration in time range)    Or  ondansetron (ZOFRAN) injection 4 mg (has no administration in time range)  ipratropium  (ATROVENT) nebulizer solution 0.5 mg (has no administration in time range)  levalbuterol (XOPENEX) nebulizer solution 0.63 mg (has no administration in time range)  hydrALAZINE (APRESOLINE) injection 10 mg (has no administration in time range)  oxyCODONE (Oxy IR/ROXICODONE) immediate release tablet 5 mg (has no administration in time range)  oxyCODONE ER C12A 1 capsule (has no administration in time range)  FLUoxetine (PROZAC) capsule 10 mg (has no administration in time range)  levothyroxine (SYNTHROID) tablet 175 mcg (has no administration in time range)  calcium acetate (PHOSLO) capsule 1,334 mg (has no administration in time range)  polyethylene glycol (MIRALAX / GLYCOLAX) packet 17 g (has no administration in time range)  senna (SENOKOT) tablet 8.6 mg (has no administration in time range)  cefTRIAXone (ROCEPHIN) 2 g in sodium chloride 0.9 % 100 mL IVPB (has no administration in time range)  sodium chloride 0.9 % bolus 1,000 mL (1,000 mLs Intravenous New Bag/Given 09/23/20 1310)  cefTRIAXone (ROCEPHIN) 1 g in sodium chloride 0.9 % 100 mL IVPB (0 g Intravenous Stopped 09/23/20 1407)    ED Course  I have reviewed the triage vital signs and the nursing notes.  Pertinent labs & imaging results that were available during my care of the patient were reviewed by me and considered in my medical decision making (see chart for details).    MDM Rules/Calculators/A&P                         Initial impression-patient presents with URI-like symptoms.  She is alert, does not appear to be in acute distress, vital signs reassuring.  Suspect viral infection will obtain chest pain work-up, basic lab work-up and reassess.  Work-up-CBC shows leukocytosis 11.3, BMP shows hyponatremia 132, hyperglycemia 142, elevated BUN of 38, elevated creatinine 3.16, decreased creatinine 17.  UA shows large leukocytes, many red blood cells many white blood cells, 9 no squamous cells.  Urine pregnancy is negative.  EKG sinus  without signs of ischemia, chest x-ray negative for acute findings.  CT renal reveals no acute intra-abdominal process, chronic bilateral hydronephrosis, continued distention of the endometrial canal, chronic densely calcified retroperitoneum and pelvic lymph off the knee.   Reassessment-lab work reveals worsening creatinine and elevated BUN with  UA concerning for possible infection.  Will obtain CT renal to exclude possibility of hydronephrosis, nephrolithiasis or pyelonephritis.  Updated patient on recommendations from nephrologist, patient is agreeable this plan.  Will consult with hospitalist for admission.  Consult - 1. due to worsening creatinine and questionable UTI will speak with nephrology for further recommendations.  Spoke with Dr. Justin Mend he recommends admit for observation and continue with fluids and antibiotics and reassess in the morning.  If renal function is not improving recommend reaching out to nephrology for formal consultation.  2.  Spoke with Dr.Shahmehdi of the hospitalist team, he has accepted the patient will come down and evaluate  Rule out-I have low suspicion for pyelonephritis or kidney stone as there is no noted stranding seen on CT renal, no stone present on imaging.  I have low suspicion patient would require emergent hemodialysis as there is no signs of fluid overload present my exam, no severe electrolyte derangements, no EKG changes.  Low suspicion for ACS as history is atypical, patient has low risk factors, EKG without signs of ischemia patient has a initial troponin of 8, will defer second troponin as patient has had chest pain for greater than 12 hours would expect elevation in troponin if ACS was present.  Plan-admit to medicine due to AKI which I suspect is secondary to possible UTI.  Patient will be transferred to admitting team.  Final Clinical Impression(s) / ED Diagnoses Final diagnoses:  AKI (acute kidney injury) ([place])  Acute cystitis with hematuria     Rx / DC Orders ED Discharge Orders    None       Marcello Fennel, PA-C 09/23/20 1441    Margette Fast, MD 09/24/20 [phone]

## 2020-09-23 NOTE — ED Notes (Signed)
Attempted to give report to Nurse and charge nurse. Charge nurse stated nurse will call me back.

## 2020-09-23 NOTE — ED Notes (Signed)
Pt did not want port accessed.

## 2020-09-23 NOTE — H&P (Addendum)
History and Physical   Patient: Tanya Wright                            PCP: System, Provider Not In                    DOB: 10-02-1965            DOA: 09/23/2020 TIW:[id number]             DOS: 09/23/2020, 2:26 PM  Patient coming from:   HOME  I have personally reviewed patient's medical records, in electronic medical records, including:  [place] link, and care everywhere.    Chief Complaint:   Chief Complaint  Patient presents with  . Chest Pain    History of present illness:    Tanya Wright is a 55 y.o. female with medical history significant of stage IV cervical cancer on immunotherapy, HTN, hypothyroidism, chronic bilateral ureteral stent, with hydronephrosis presenting with chief complaint of fever, chills, headaches, congestion, body aches, stomach pain, nausea, vomiting.  1 episode of diarrhea nonbloody.  Is vaccinated for COVID and influenza. Nonspecific right-sided chest pain nonradiating was not associate with shortness of breath has resolved.    Patient Denies having: Cough, SOB, Chest Pain, abdominal pain, dizziness, lightheadedness,  Dysuria, Joint pain, rash, open wounds  ED Course:   Vitals: Temp 99.2, pulse 104, RR 23, BP 124/104, Abnormal labs; glucose 142, BUN 38, creatinine 3.16, calcium 8.2, CBC WBC 11.3, UA moderate hemoglobin, leukocyte [place], WBC>50 CT  Renal: IMPRESSION: Revealing bilateral double-J ureteral stent with chronic bilateral hydronephrosis, distended ?  Obstruction, related to cervical cancer, posttreatment changes (concerning [place]) Chronic dense calcified retroperitoneal and pelvic lymph edema, aortic atherosclerosis   Review of Systems: As per HPI, otherwise 10 point review of systems were negative.   ----------------------------------------------------------------------------------------------------------------------  Allergies  Allergen Reactions  . Oxycodone Itching    Home MEDs:  Prior to Admission medications   Medication Sig  Start Date End Date Taking? Authorizing Provider  calcium acetate (PHOSLO) 667 MG capsule Take 1,334 mg by mouth 3 (three) times daily with meals.   Yes [provider]  FLUoxetine (PROZAC) 10 MG capsule Take 10 mg by mouth daily. 03/02/17 09/23/20 Yes [provider]  levothyroxine (SYNTHROID) 175 MCG tablet Take 175 mcg by mouth daily. 09/01/20  Yes [provider]  oxyCODONE (OXY IR/ROXICODONE) 5 MG immediate release tablet Take 1 tablet by mouth every 4 (four) hours as needed. 09/10/20  Yes [provider]  polyethylene glycol (MIRALAX / GLYCOLAX) packet Take 17 g by mouth daily. 02/22/17  Yes [name], MD  senna (SENOKOT) 8.6 MG tablet Take 1 tablet by mouth at bedtime.   Yes [provider]  XTAMPZA ER 18 MG C12A Take 1 capsule by mouth 2 (two) times daily. 09/01/20  Yes [provider]  oxyCODONE-acetaminophen (PERCOCET/ROXICET) 5-325 MG tablet Take 1 tablet by mouth every 6 (six) hours as needed for severe pain. Patient not taking: No sig reported 02/04/19   Long, Wonda Olds, MD  penicillin v potassium (VEETID) 500 MG tablet Take 1 tablet by mouth daily. Patient not taking: Reported on 09/23/2020 01/29/19   [provider]  SUMAtriptan (IMITREX) 25 MG tablet Take 1 tablet (25 mg total) by mouth once for 1 dose. Repeat in 2 hours if headache persists.  Do not take more than two days in a week. Patient not taking: Reported on 02/04/2019 04/19/17  04/19/17  Elodia Florence., MD    PRN MEDs: sodium chloride, acetaminophen **OR** acetaminophen, bisacodyl, hydrALAZINE, HYDROmorphone (DILAUDID) injection, ipratropium, levalbuterol, magnesium citrate, ondansetron **OR** ondansetron (ZOFRAN) IV, oxyCODONE, senna-docusate, sodium chloride flush, traZODone  Past Medical History:  Diagnosis Date  . Cancer ([place])   . Cervical cancer ([place])   . Hypertension   . Renal disorder   . Thyroid disease     Past Surgical History:  Procedure  Laterality Date  . STENT PLACEMENT RT URETER ([facility])     sent in both kidneys for stones  . TUBAL LIGATION       reports that she has quit smoking. Her smoking use included cigarettes. She has never used smokeless tobacco. She reports current alcohol use. She reports current drug use. Drug: Marijuana.   History reviewed. No pertinent family history.  Physical Exam:   Vitals:   09/23/20 1200 09/23/20 1300 09/23/20 1330 09/23/20 1400  BP: 110/71 116/84 (!) 123/91 (!) 124/104  Pulse:  (!) 101 (!) 101 (!) 104  Resp: (!) 23 20 19  (!) 23  Temp:      TempSrc:      SpO2:  94% 96% 97%  Weight:       Constitutional: NAD, calm, comfortable Eyes: PERRL, lids and conjunctivae normal ENMT: Mucous membranes are moist. Posterior pharynx clear of any exudate or lesions.Normal dentition.  Neck: normal, supple, no masses, no thyromegaly Respiratory: clear to auscultation bilaterally, no wheezing, no crackles. Normal respiratory effort. No accessory muscle use.  Cardiovascular: tachycardic, no murmurs / rubs / gallops. No extremity edema. 2+ pedal pulses. No carotid bruits.  Abdomen: no tenderness, no masses palpated. No hepatosplenomegaly. Bowel sounds positive.  Musculoskeletal: no clubbing / cyanosis. No joint deformity upper and lower extremities. Good ROM, no contractures. Normal muscle tone.  Neurologic: CN II-XII grossly intact. Sensation intact, DTR normal. Strength 5/5 in all 4.  Psychiatric: Normal judgment and insight. Alert and oriented x 3. Normal mood.  Skin: no rashes, lesions, ulcers. No induration Wounds: per nursing documentation     Labs on admission:    I have personally reviewed following labs and imaging studies  CBC: Recent Labs  Lab 09/23/20 1132  WBC 11.3*  HGB 12.4  HCT 37.6  MCV 97.4  PLT 076   Basic Metabolic Panel: Recent Labs  Lab 09/23/20 1132  NA 132*  K 3.7  CL 96*  CO2 24  GLUCOSE 142*  BUN 38*  CREATININE 3.16*  CALCIUM 8.2*    GFR: CrCl cannot be calculated (Unknown ideal weight.). Liver Function Tests: No results for input(s): AST, ALT, ALKPHOS, BILITOT, PROT, ALBUMIN in the last 168 hours. No results for input(s): LIPASE, AMYLASE in the last 168 hours. No results for input(s): AMMONIA in the last 168 hours. Coagulation Profile: No results for input(s): INR, PROTIME in the last 168 hours. Cardiac Enzymes: No results for input(s): CKTOTAL, CKMB, CKMBINDEX, TROPONINI in the last 168 hours. BNP (last 3 results) No results for input(s): PROBNP in the last 8760 hours. HbA1C: No results for input(s): HGBA1C in the last 72 hours. CBG: No results for input(s): GLUCAP in the last 168 hours. Lipid Profile: No results for input(s): CHOL, HDL, LDLCALC, TRIG, CHOLHDL, LDLDIRECT in the last 72 hours. Thyroid Function Tests: No results for input(s): TSH, T4TOTAL, FREET4, T3FREE, THYROIDAB in the last 72 hours. Anemia Panel: No results for input(s): VITAMINB12, FOLATE, FERRITIN, TIBC, IRON, RETICCTPCT in the last 72 hours. Urine analysis:    Component Value Date/Time  COLORURINE YELLOW 09/23/2020 1123   APPEARANCEUR TURBID (A) 09/23/2020 1123   LABSPEC 1.013 09/23/2020 1123   PHURINE 7.0 09/23/2020 1123   GLUCOSEU NEGATIVE 09/23/2020 1123   HGBUR MODERATE (A) 09/23/2020 1123   BILIRUBINUR NEGATIVE 09/23/2020 1123   KETONESUR NEGATIVE 09/23/2020 1123   PROTEINUR >=300 (A) 09/23/2020 1123   NITRITE NEGATIVE 09/23/2020 1123   LEUKOCYTESUR LARGE (A) 09/23/2020 1123     Radiologic Exams on Admission:   DG Chest Portable 1 View  Result Date: 09/23/2020 CLINICAL DATA:  55 year old female with chest pain on the right. Fever, chills, headache. History of cervical cancer. EXAM: PORTABLE CHEST 1 VIEW COMPARISON:  Chest radiographs 04/16/2017. FINDINGS: Portable AP upright view at 1114 hours. Stable right chest Port-A-Cath. Allowing for portable technique lung volumes and mediastinal contours remain within normal  limits, lungs are clear. No pneumothorax. Interval thyroidectomy, with resolved rightward shift of the trachea at the thoracic inlet. No acute osseous abnormality identified. Paucity of bowel gas in the upper abdomen. IMPRESSION: No acute cardiopulmonary abnormality. Electronically Signed   By: Genevie Ann M.D.   On: 09/23/2020 11:21   CT Renal Stone Study  Result Date: 09/23/2020 CLINICAL DATA:  Right flank pain and hematuria for the past day. History of cervical cancer. EXAM: CT ABDOMEN AND PELVIS WITHOUT CONTRAST TECHNIQUE: Multidetector CT imaging of the abdomen and pelvis was performed following the standard protocol without IV contrast. COMPARISON:  CT abdomen pelvis dated February 04, 2019. FINDINGS: Lower chest: No acute abnormality. Hepatobiliary: No focal liver abnormality is seen. No gallstones, gallbladder wall thickening, or biliary dilatation. Pancreas: Unchanged small calcifications in the pancreatic head consistent with chronic pancreatitis. No ductal dilatation or surrounding inflammatory changes. Spleen: Normal in size without focal abnormality. Adrenals/Urinary Tract: Adrenal glands are unremarkable. Bilateral double-J ureteral stents again noted and appropriately positioned. Chronic bilateral hydronephrosis, mildly improved compared to prior study. No renal calculi. Mild circumferential bladder wall thickening. Stomach/Bowel: Stomach is within normal limits. Appendix appears normal. No evidence of bowel wall thickening, distention, or inflammatory changes. Vascular/Lymphatic: Chronic densely calcified retroperitoneal and pelvic lymphadenopathy, similar to prior study. Aortic atherosclerosis. Reproductive: Improved but continued distension of the endometrial canal, which measures up to 1.9 cm (series 6, image 55), previously 3.6 cm. No adnexal mass. Other: Trace free fluid in the pelvis.  No pneumoperitoneum. Musculoskeletal: No acute or significant osseous findings. IMPRESSION: 1. No acute  intra-abdominal process. 2. Bilateral double-J ureteral stents with chronic bilateral hydronephrosis, mildly improved compared to prior study. 3. Improved but continued distension of the endometrial canal, which measures up to 1.9 cm, previously 3.6 cm. This may reflect obstruction related to cervical cancer/post treatment changes. Consider pelvic ultrasound for further evaluation. 4. Chronic densely calcified retroperitoneal and pelvic lymphadenopathy, similar to prior study, consistent with treated metastatic disease. 5. Aortic Atherosclerosis (ICD10-I70.0). Electronically Signed   By: Titus Dubin M.D.   On: 09/23/2020 13:13    EKG:   Independently reviewed.  Orders placed or performed during the hospital encounter of 09/23/20  . ED EKG  . ED EKG  . EKG 12-Lead  . EKG 12-Lead  . EKG 12-Lead   ---------------------------------------------------------------------------------------------------------------------------------------    Assessment / Plan:   Principal Problem:   UTI (urinary tract infection) Active Problems:   Uterine cervix cancer (HCC)   CKD (chronic kidney disease)   Anemia in other chronic diseases classified elsewhere  Sepsis due to UTI Urine tract infection in the setting of chronic bilateral hydronephrosis with double-J stent in place due to cervical  cancer -Complicated UTI -immunocompromised -Patient meets sepsis criteria -also infection UTI, tachycardic with heart rate of 104, tachypneic with respiratory rate of 23, blood pressure stable 124 /104, temp currently 99.2, satting 97% on room air And end organ compromise with creatinine 3.16 -We will follow up with blood in the urine culture >> -Has been initiated on IV Rocephin will be continued 2 g every 24 hours -CT of abdomen pelvis is reviewed in detail patient has a history of cervical cancer with mets -Urologist Dr. Alyson Ingles consulted appreciate his input   Tachycardia - Denies of have any chest pain at  this time -Consistent with sinus tachycardia -Likely due to sepsis, continue aggressive IV fluid resuscitation as above -As needed metoprolol  Acute on chronic kidney disease IIIa -Exacerbated sepsis and acute on chronic bilateral hydronephrosis with presence of double-J stent due to cervical cancer  -Baseline creatinine 1.97 approximately a year ago currently 3.16 -We will continue with IV fluid hydration -We will avoid all nephrotoxins  Stage IV cervical cancer History of chronic uterine/cervical cancer with bilateral hydronephrosis double-J stent in place -Follow-up with urology at [facility], as outpatient -Chronic stent placement  Anemia of chronic disease -Monitoring H&H, currently stable  Depression -Continue home medication of Prozac  Hypothyroidism  -Stable continue Synthroid  chronic pain syndrome likely due to cancer Continue current home regimen oxycodone, and OxyContin   Cultures:  -Urine culture -Blood cultures x2 Antimicrobial: -IV Rocephin  Consults called: none  -------------------------------------------------------------------------------------------------------------------------------------------- DVT prophylaxis: SCD/Compression stockings and Heparin SQ Code Status:   Code Status: Full Code   Admission status: Patient will be admitted as Observation, with a greater than 2 midnight length of stay. Level of care: Med-Surg   Family Communication:  none at bedside  (The above findings and plan of care has been discussed with patient in detail, the patient expressed understanding and agreement of above plan)  ---------------------------------------------------------------------------------------------------------------------------------------------  Disposition Plan:  Anticipated 1-2 days Status is: Inpatient The patient remains as an patient will need greater than 2 midnights stay to treat her current infection, sepsis. Will need IV  fluids IV antibiotics, specialty urology consultation evaluation.   Dispo: The patient is from: Home              Anticipated d/c is to: Home              Patient currently is not medically stable to d/c.  As patient remains septic, needing aggressive IV fluid IV antibiotics   Difficult to place patient No    --------------------------------------------------------------------------------------------------------------------------------------------  Time spent: > than  36  Min.  Critical time was spent seeing evaluating the patient, stabilizing the patient, Reviewing electronic medical records, discussing with nursing staff and consultants.  SIGNED: Deatra James, MD, FHM. [facility],  Pager (Please use [email or web address] to page to text)  If 7PM-7AM, please contact night-coverage [email or web address],  09/23/2020, 2:26 PM

## 2020-09-23 NOTE — ED Notes (Signed)
DR changed room to step down

## 2020-09-24 DIAGNOSIS — N3001 Acute cystitis with hematuria: Secondary | ICD-10-CM

## 2020-09-24 LAB — BLOOD CULTURE ID PANEL (REFLEXED) - BCID2

## 2020-09-24 LAB — GLUCOSE, CAPILLARY: Glucose-Capillary: 99 mg/dL (ref 70–99)

## 2020-09-24 LAB — BASIC METABOLIC PANEL
Anion gap: 8 (ref 5–15)
BUN: 39 mg/dL — ABNORMAL HIGH (ref 6–20)
CO2: 24 mmol/L (ref 22–32)
Calcium: 7.6 mg/dL — ABNORMAL LOW (ref 8.9–10.3)
Chloride: 100 mmol/L (ref 98–111)
Creatinine, Ser: 3 mg/dL — ABNORMAL HIGH (ref 0.44–1.00)
GFR, Estimated: 18 mL/min — ABNORMAL LOW (ref >60)
Glucose, Bld: 102 mg/dL — ABNORMAL HIGH (ref 70–99)
Potassium: 4 mmol/L (ref 3.5–5.1)
Sodium: 132 mmol/L — ABNORMAL LOW (ref 135–145)

## 2020-09-24 LAB — CBC
HCT: 32.2 % — ABNORMAL LOW (ref 36.0–46.0)
Hemoglobin: 10.5 g/dL — ABNORMAL LOW (ref 12.0–15.0)
MCH: 31.7 pg (ref 26.0–34.0)
MCHC: 32.6 g/dL (ref 30.0–36.0)
MCV: 97.3 fL (ref 80.0–100.0)
Platelets: 143 10*3/uL — ABNORMAL LOW (ref 150–400)
RBC: 3.31 MIL/uL — ABNORMAL LOW (ref 3.87–5.11)
RDW: 14.9 % (ref 11.5–15.5)
WBC: 9.1 10*3/uL (ref 4.0–10.5)
nRBC: 0 % (ref 0.0–0.2)

## 2020-09-24 LAB — MRSA PCR SCREENING: MRSA by PCR: NEGATIVE

## 2020-09-24 LAB — HIV ANTIBODY (ROUTINE TESTING W REFLEX): HIV Screen 4th Generation wRfx: NONREACTIVE

## 2020-09-24 LAB — PROTIME-INR
INR: 1.2 (ref 0.8–1.2)
Prothrombin Time: 15.5 seconds — ABNORMAL HIGH (ref 11.4–15.2)

## 2020-09-24 NOTE — TOC Progression Note (Signed)
Transition of Care [facility]) - Progression Note    Patient Details  Name: Tanya Wright MRN: [id number] Date of Birth: 11-15-1965  [facility] CM/SW Contact  Salome Arnt, [place] Phone Number: 09/24/2020, 10:31 AM  Clinical Narrative: TOC received consult for HH/DME and PCP needs. LCSW spoke with pt who reports her PCP is in [place]. PT evaluated pt and no follow up needed. Pt reports no DME or other needs at this time. TOC will continue to follow.          Barriers to Discharge: Continued Medical Work up  Expected Discharge Plan and Services                                                 Social Determinants of Health (SDOH) Interventions    Readmission Risk Interventions No flowsheet data found.

## 2020-09-24 NOTE — Consult Note (Signed)
Urology Consult  Referring physician: Dr. Roger Shelter Reason for referral: Bilateral hydronephrosis with ureteral stents  Chief Complaint:  Chills, fever  History of Present Illness: Tanya Wright is a 55yo admitted with sepsis from a urinary source. She has a history of cervical cancer treated with radiation therapy and resultant bilateral distal ureteral obstruction managed with bilateral ureteral stent. She is followed by [facility] and last stent exchange was 05/2020. She has her stents exchanged every 6 months. For 2-3 days prior to admission she was having urinary urgency, urinary frequency, dysuria, fevers to 101 and decreased urine output. On presentation to the ER she underwent CT which showed a thick walled bladder with associated bilateral hydronephrosis with elteral stents in good position. Creatinine 3.16 on admission.   Past Medical History:  Diagnosis Date  . Cancer ([place])   . Cervical cancer ([place])   . Hypertension   . Renal disorder   . Thyroid disease    Past Surgical History:  Procedure Laterality Date  . STENT PLACEMENT RT URETER ([place] HX)     sent in both kidneys for stones  . TUBAL LIGATION      Medications:  I have reviewed the patient's current medications. Prior to Admission:  Medications Prior to Admission  Medication Sig Dispense Refill Last Dose  . calcium acetate (PHOSLO) 667 MG capsule Take 1,334 mg by mouth 3 (three) times daily with meals.   09/22/2020 at Unknown time  . FLUoxetine (PROZAC) 10 MG capsule Take 10 mg by mouth daily.   09/22/2020 at Unknown time  . levothyroxine (SYNTHROID) 175 MCG tablet Take 175 mcg by mouth daily.   09/22/2020 at Unknown time  . oxyCODONE (OXY IR/ROXICODONE) 5 MG immediate release tablet Take 1 tablet by mouth every 4 (four) hours as needed.   09/22/2020 at Unknown time  . polyethylene glycol (MIRALAX / GLYCOLAX) packet Take 17 g by mouth daily. 14 each 0   . senna (SENOKOT) 8.6 MG tablet Take 1 tablet by mouth at bedtime.     Marland Kitchen  XTAMPZA ER 18 MG C12A Take 1 capsule by mouth 2 (two) times daily.   09/22/2020 at Unknown time  . oxyCODONE-acetaminophen (PERCOCET/ROXICET) 5-325 MG tablet Take 1 tablet by mouth every 6 (six) hours as needed for severe pain. (Patient not taking: No sig reported) 5 tablet 0 Not Taking at Unknown time  . penicillin v potassium (VEETID) 500 MG tablet Take 1 tablet by mouth daily. (Patient not taking: Reported on 09/23/2020)   Not Taking at Unknown time  . SUMAtriptan (IMITREX) 25 MG tablet Take 1 tablet (25 mg total) by mouth once for 1 dose. Repeat in 2 hours if headache persists.  Do not take more than two days in a week. (Patient not taking: Reported on 02/04/2019) 8 tablet 0    Allergies:  Allergies  Allergen Reactions  . Oxycodone Itching    History reviewed. No pertinent family history. Social History:  reports that she has quit smoking. Her smoking use included cigarettes. She has never used smokeless tobacco. She reports current alcohol use. She reports current drug use. Drug: Marijuana.  Review of Systems  Constitutional: Positive for chills and fever.  All other systems reviewed and are negative.   Physical Exam:  Vital signs in last 24 hours: Temp:  [99.2 F (37.3 C)-101.7 F (38.7 C)] 101.7 F (38.7 C) (06/03 0513) Pulse Rate:  [92-140] 109 (06/03 0600) Resp:  [8-35] 27 (06/03 0600) BP: (90-160)/(61-104) 111/69 (06/03 0600) SpO2:  [91 %-100 %]  92 % (06/03 0600) Weight:  [84.8 kg-88.6 kg] 88.6 kg (06/03 0513) Physical Exam Vitals reviewed.  Constitutional:      Appearance: She is well-developed.  HENT:     Head: Normocephalic and atraumatic.  Cardiovascular:     Rate and Rhythm: Normal rate and regular rhythm.  Pulmonary:     Effort: Pulmonary effort is normal. No tachypnea.  Abdominal:     Palpations: Abdomen is soft.     Tenderness: There is no abdominal tenderness.  Musculoskeletal:        General: Normal range of motion.     Cervical back: Normal range of  motion and neck supple.     Right lower leg: No edema.     Left lower leg: No edema.  Skin:    General: Skin is warm and dry.  Neurological:     General: No focal deficit present.     Mental Status: She is alert and oriented to person, place, and time.  Psychiatric:        Mood and Affect: Mood normal.        Behavior: Behavior normal.     Laboratory Data:  Results for orders placed or performed during the hospital encounter of 09/23/20 (from the past 72 hour(s))  Resp Panel by RT-PCR (Flu A&B, Covid) Nasopharyngeal Swab     Status: None   Collection Time: 09/23/20 11:23 AM   Specimen: Nasopharyngeal Swab; Nasopharyngeal(NP) swabs in vial transport medium  Result Value Ref Range   SARS Coronavirus 2 by RT PCR NEGATIVE NEGATIVE    Comment: (NOTE) SARS-CoV-2 target nucleic acids are NOT DETECTED.  The SARS-CoV-2 RNA is generally detectable in upper respiratory specimens during the acute phase of infection. The lowest concentration of SARS-CoV-2 viral copies this assay can detect is 138 copies/mL. A negative result does not preclude SARS-Cov-2 infection and should not be used as the sole basis for treatment or other patient management decisions. A negative result may occur with  improper specimen collection/handling, submission of specimen other than nasopharyngeal swab, presence of viral mutation(s) within the areas targeted by this assay, and inadequate number of viral copies(<138 copies/mL). A negative result must be combined with clinical observations, patient history, and epidemiological information. The expected result is Negative.  Fact Sheet for Patients:  EntrepreneurPulse.com.au  Fact Sheet for Healthcare Providers:  IncredibleEmployment.be  This test is no t yet approved or cleared by the [place] FDA and  has been authorized for detection and/or diagnosis of SARS-CoV-2 by FDA under an Emergency Use Authorization (EUA). This  EUA will remain  in effect (meaning this test can be used) for the duration of the COVID-19 declaration under Section 564(b)(1) of the Act, 21 U.S.C.section 360bbb-3(b)(1), unless the authorization is terminated  or revoked sooner.       Influenza A by PCR NEGATIVE NEGATIVE   Influenza B by PCR NEGATIVE NEGATIVE    Comment: (NOTE) The Xpert Xpress SARS-CoV-2/FLU/RSV plus assay is intended as an aid in the diagnosis of influenza from Nasopharyngeal swab specimens and should not be used as a sole basis for treatment. Nasal washings and aspirates are unacceptable for Xpert Xpress SARS-CoV-2/FLU/RSV testing.  Fact Sheet for Patients: EntrepreneurPulse.com.au  Fact Sheet for Healthcare Providers: IncredibleEmployment.be  This test is not yet approved or cleared by the [place] FDA and has been authorized for detection and/or diagnosis of SARS-CoV-2 by FDA under an Emergency Use Authorization (EUA). This EUA will remain in effect (meaning this test can be used) for  the duration of the COVID-19 declaration under Section 564(b)(1) of the Act, 21 U.S.C. section 360bbb-3(b)(1), unless the authorization is terminated or revoked.  Performed at [facility], [address], [place], [place] [address]   Urinalysis, Routine w reflex microscopic     Status: Abnormal   Collection Time: 09/23/20 11:23 AM  Result Value Ref Range   Color, Urine YELLOW YELLOW   APPearance TURBID (A) CLEAR   Specific Gravity, Urine 1.013 1.005 - 1.030   pH 7.0 5.0 - 8.0   Glucose, UA NEGATIVE NEGATIVE mg/dL   Hgb urine dipstick MODERATE (A) NEGATIVE   Bilirubin Urine NEGATIVE NEGATIVE   Ketones, ur NEGATIVE NEGATIVE mg/dL   Protein, ur >=300 (A) NEGATIVE mg/dL   Nitrite NEGATIVE NEGATIVE   Leukocytes,Ua LARGE (A) NEGATIVE   RBC / HPF >50 (H) 0 - 5 RBC/hpf   WBC, UA >50 (H) 0 - 5 WBC/hpf   Bacteria, UA NONE SEEN NONE SEEN   Squamous Epithelial / LPF 0-5 0 - 5   WBC  Clumps PRESENT    Budding Yeast PRESENT    Ca Oxalate Crys, UA PRESENT    Non Squamous Epithelial 0-5 (A) NONE SEEN    Comment: Performed at [facility], [address], [place], [place] [address]  Pregnancy, urine     Status: None   Collection Time: 09/23/20 11:23 AM  Result Value Ref Range   Preg Test, Ur NEGATIVE NEGATIVE    Comment:        THE SENSITIVITY OF THIS METHODOLOGY IS >20 mIU/mL. Performed at [facility], [address], [place], [place] [address]   Basic metabolic panel     Status: Abnormal   Collection Time: 09/23/20 11:32 AM  Result Value Ref Range   Sodium 132 (L) 135 - 145 mmol/L   Potassium 3.7 3.5 - 5.1 mmol/L   Chloride 96 (L) 98 - 111 mmol/L   CO2 24 22 - 32 mmol/L   Glucose, Bld 142 (H) 70 - 99 mg/dL    Comment: Glucose reference range applies only to samples taken after fasting for at least 8 hours.   BUN 38 (H) 6 - 20 mg/dL   Creatinine, Ser 3.16 (H) 0.44 - 1.00 mg/dL   Calcium 8.2 (L) 8.9 - 10.3 mg/dL   GFR, Estimated 17 (L) >60 mL/min    Comment: (NOTE) Calculated using the CKD-EPI Creatinine Equation (2021)    Anion gap 12 5 - 15    Comment: Performed at [facility], [address], [place], [place] [address]  CBC     Status: Abnormal   Collection Time: 09/23/20 11:32 AM  Result Value Ref Range   WBC 11.3 (H) 4.0 - 10.5 K/uL   RBC 3.86 (L) 3.87 - 5.11 MIL/uL   Hemoglobin 12.4 12.0 - 15.0 g/dL   HCT 37.6 36.0 - 46.0 %   MCV 97.4 80.0 - 100.0 fL   MCH 32.1 26.0 - 34.0 pg   MCHC 33.0 30.0 - 36.0 g/dL   RDW 15.2 11.5 - 15.5 %   Platelets 171 150 - 400 K/uL   nRBC 0.0 0.0 - 0.2 %    Comment: Performed at [facility], [address] [place],  [address]  Troponin I (High Sensitivity)     Status: None   Collection Time: 09/23/20 11:32 AM  Result Value Ref Range   Troponin I (High Sensitivity) 8 <18 ng/L    Comment: (NOTE) Elevated high sensitivity troponin I (hsTnI) values and significant  changes  across serial measurements may suggest  ACS but many other  chronic and acute conditions are known to elevate hsTnI results.  Refer to the "Links" section for chest pain algorithms and additional  guidance. Performed at [facility], [address] [place], [place] [address]   HIV Antibody (routine testing w rflx)     Status: None   Collection Time: 09/23/20 11:32 AM  Result Value Ref Range   HIV Screen 4th Generation wRfx Non Reactive Non Reactive    Comment: Performed at [facility], [place] [address] [place], [place] [address]  Brain natriuretic peptide     Status: None   Collection Time: 09/23/20 11:32 AM  Result Value Ref Range   B Natriuretic Peptide 86.0 0.0 - 100.0 pg/mL    Comment: Performed at [facility], [address], [place], [place] [address]  Troponin I (High Sensitivity)     Status: None   Collection Time: 09/23/20  4:09 PM  Result Value Ref Range   Troponin I (High Sensitivity) 9 <18 ng/L    Comment: (NOTE) Elevated high sensitivity troponin I (hsTnI) values and significant  changes across serial measurements may suggest ACS but many other  chronic and acute conditions are known to elevate hsTnI results.  Refer to the Links section for chest pain algorithms and additional  guidance. Performed at [facility], [address], [place], [place] [address]   Culture, blood (routine x 2)     Status: None (Preliminary result)   Collection Time: 09/23/20  4:09 PM   Specimen: BLOOD RIGHT FOREARM  Result Value Ref Range   Specimen Description BLOOD RIGHT FOREARM    Special Requests      Blood Culture results may not be optimal due to an inadequate volume of blood received in culture bottles BOTTLES DRAWN AEROBIC AND ANAEROBIC   Culture      NO GROWTH < 24 HOURS Performed at [facility], [address], [place], [place] [address]    Report Status PENDING   Culture, blood (routine x 2)     Status: None (Preliminary result)   Collection Time: 09/23/20  4:09 PM   Specimen: BLOOD  Result Value Ref Range    Specimen Description BLOOD LEFT ANTECUBITAL    Special Requests      Blood Culture adequate volume BOTTLES DRAWN AEROBIC AND ANAEROBIC   Culture      NO GROWTH < 24 HOURS Performed at [facility], [address] [place], [place] [address]    Report Status PENDING   CBC with Differential     Status: Abnormal   Collection Time: 09/23/20  4:09 PM  Result Value Ref Range   WBC 11.2 (H) 4.0 - 10.5 K/uL   RBC 3.51 (L) 3.87 - 5.11 MIL/uL   Hemoglobin 11.0 (L) 12.0 - 15.0 g/dL   HCT 34.2 (L) 36.0 - 46.0 %   MCV 97.4 80.0 - 100.0 fL   MCH 31.3 26.0 - 34.0 pg   MCHC 32.2 30.0 - 36.0 g/dL   RDW 15.1 11.5 - 15.5 %   Platelets 155 150 - 400 K/uL   nRBC 0.0 0.0 - 0.2 %   Neutrophils Relative % 91 %   Neutro Abs 10.1 (H) 1.7 - 7.7 K/uL   Lymphocytes Relative 3 %   Lymphs Abs 0.3 (L) 0.7 - 4.0 K/uL   Monocytes Relative 5 %   Monocytes Absolute 0.6 0.1 - 1.0 K/uL   Eosinophils Relative 0 %   Eosinophils Absolute  0.0 0.0 - 0.5 K/uL   Basophils Relative 0 %   Basophils Absolute 0.0 0.0 - 0.1 K/uL   Immature Granulocytes 1 %   Abs Immature Granulocytes 0.16 (H) 0.00 - 0.07 K/uL    Comment: Performed at [facility], [address], [place], [place] [address]  Comprehensive metabolic panel     Status: Abnormal   Collection Time: 09/23/20  4:09 PM  Result Value Ref Range   Sodium 134 (L) 135 - 145 mmol/L   Potassium 4.4 3.5 - 5.1 mmol/L   Chloride 101 98 - 111 mmol/L   CO2 24 22 - 32 mmol/L   Glucose, Bld 134 (H) 70 - 99 mg/dL    Comment: Glucose reference range applies only to samples taken after fasting for at least 8 hours.   BUN 40 (H) 6 - 20 mg/dL   Creatinine, Ser 3.00 (H) 0.44 - 1.00 mg/dL   Calcium 7.6 (L) 8.9 - 10.3 mg/dL   Total Protein 6.6 6.5 - 8.1 g/dL   Albumin 2.8 (L) 3.5 - 5.0 g/dL   AST 26 15 - 41 U/L   ALT 23 0 - 44 U/L   Alkaline Phosphatase 67 38 - 126 U/L   Total Bilirubin 1.1 0.3 - 1.2 mg/dL   GFR, Estimated 18 (L) >60 mL/min    Comment: (NOTE) Calculated  using the CKD-EPI Creatinine Equation (2021)    Anion gap 9 5 - 15    Comment: Performed at [facility], [address], [place], [place] [address]  Lactic acid, plasma     Status: None   Collection Time: 09/23/20  4:09 PM  Result Value Ref Range   Lactic Acid, Venous 1.1 0.5 - 1.9 mmol/L    Comment: Performed at [facility], [address], [place], [place] [address]  Protime-INR     Status: None   Collection Time: 09/23/20  4:09 PM  Result Value Ref Range   Prothrombin Time 14.8 11.4 - 15.2 seconds   INR 1.2 0.8 - 1.2    Comment: (NOTE) INR goal varies based on device and disease states. Performed at [facility], [address], [place], [place] [address]   APTT     Status: None   Collection Time: 09/23/20  4:09 PM  Result Value Ref Range   aPTT 27 24 - 36 seconds    Comment: Performed at [facility], [address], [place], [place] [address]  MRSA PCR Screening     Status: None   Collection Time: 09/23/20  5:05 PM   Specimen: Nasopharyngeal  Result Value Ref Range   MRSA by PCR NEGATIVE NEGATIVE    Comment:        The GeneXpert MRSA Assay (FDA approved for NASAL specimens only), is one component of a comprehensive MRSA colonization surveillance program. It is not intended to diagnose MRSA infection nor to guide or monitor treatment for MRSA infections. Performed at [facility], [address], [place],  [address]   Troponin I (High Sensitivity)     Status: None   Collection Time: 09/23/20 10:26 PM  Result Value Ref Range   Troponin I (High Sensitivity) 11 <18 ng/L    Comment: (NOTE) Elevated high sensitivity troponin I (hsTnI) values and significant  changes across serial measurements may suggest ACS but many other  chronic and acute conditions are known to elevate hsTnI results.  Refer to the "Links" section for chest pain algorithms and additional  guidance. Performed at [facility], [place]  [address] [place], [place] [address]   Lactic acid, plasma      Status: None   Collection Time: 09/23/20 10:26 PM  Result Value Ref Range   Lactic Acid, Venous 1.0 0.5 - 1.9 mmol/L    Comment: Performed at [facility], [address] [place], [place] [address]  Basic metabolic panel     Status: Abnormal   Collection Time: 09/24/20  5:10 AM  Result Value Ref Range   Sodium 132 (L) 135 - 145 mmol/L   Potassium 4.0 3.5 - 5.1 mmol/L   Chloride 100 98 - 111 mmol/L   CO2 24 22 - 32 mmol/L   Glucose, Bld 102 (H) 70 - 99 mg/dL    Comment: Glucose reference range applies only to samples taken after fasting for at least 8 hours.   BUN 39 (H) 6 - 20 mg/dL   Creatinine, Ser 3.00 (H) 0.44 - 1.00 mg/dL   Calcium 7.6 (L) 8.9 - 10.3 mg/dL   GFR, Estimated 18 (L) >60 mL/min    Comment: (NOTE) Calculated using the CKD-EPI Creatinine Equation (2021)    Anion gap 8 5 - 15    Comment: Performed at [facility], [address], [place], [place] [address]  CBC     Status: Abnormal   Collection Time: 09/24/20  5:10 AM  Result Value Ref Range   WBC 9.1 4.0 - 10.5 K/uL   RBC 3.31 (L) 3.87 - 5.11 MIL/uL   Hemoglobin 10.5 (L) 12.0 - 15.0 g/dL   HCT 32.2 (L) 36.0 - 46.0 %   MCV 97.3 80.0 - 100.0 fL   MCH 31.7 26.0 - 34.0 pg   MCHC 32.6 30.0 - 36.0 g/dL   RDW 14.9 11.5 - 15.5 %   Platelets 143 (L) 150 - 400 K/uL   nRBC 0.0 0.0 - 0.2 %    Comment: Performed at [facility], [address], [place], [place] [address]  Protime-INR     Status: Abnormal   Collection Time: 09/24/20  5:10 AM  Result Value Ref Range   Prothrombin Time 15.5 (H) 11.4 - 15.2 seconds   INR 1.2 0.8 - 1.2    Comment: (NOTE) INR goal varies based on device and disease states. Performed at [facility], [address], [place], [place] [address]   Glucose, capillary     Status: None   Collection Time: 09/24/20  8:11 AM  Result Value Ref Range   Glucose-Capillary 99 70 - 99 mg/dL    Comment: Glucose reference range applies only to samples taken after fasting for at least 8 hours.   Comment 1  Notify RN    Comment 2 Document in Chart    Recent Results (from the past 240 hour(s))  Resp Panel by RT-PCR (Flu A&B, Covid) Nasopharyngeal Swab     Status: None   Collection Time: 09/23/20 11:23 AM   Specimen: Nasopharyngeal Swab; Nasopharyngeal(NP) swabs in vial transport medium  Result Value Ref Range Status   SARS Coronavirus 2 by RT PCR NEGATIVE NEGATIVE Final    Comment: (NOTE) SARS-CoV-2 target nucleic acids are NOT DETECTED.  The SARS-CoV-2 RNA is generally detectable in upper respiratory specimens during the acute phase of infection. The lowest concentration of SARS-CoV-2 viral copies this assay can detect is 138 copies/mL. A negative result does not preclude SARS-Cov-2 infection and should not be used as the sole basis for treatment or other patient management decisions. A negative result may occur with  improper specimen collection/handling, submission of specimen other  than nasopharyngeal swab, presence of viral mutation(s) within the areas targeted by this assay, and inadequate number of viral copies(<138 copies/mL). A negative result must be combined with clinical observations, patient history, and epidemiological information. The expected result is Negative.  Fact Sheet for Patients:  EntrepreneurPulse.com.au  Fact Sheet for Healthcare Providers:  IncredibleEmployment.be  This test is no t yet approved or cleared by the [place] FDA and  has been authorized for detection and/or diagnosis of SARS-CoV-2 by FDA under an Emergency Use Authorization (EUA). This EUA will remain  in effect (meaning this test can be used) for the duration of the COVID-19 declaration under Section 564(b)(1) of the Act, 21 U.S.C.section 360bbb-3(b)(1), unless the authorization is terminated  or revoked sooner.       Influenza A by PCR NEGATIVE NEGATIVE Final   Influenza B by PCR NEGATIVE NEGATIVE Final    Comment: (NOTE) The Xpert Xpress  SARS-CoV-2/FLU/RSV plus assay is intended as an aid in the diagnosis of influenza from Nasopharyngeal swab specimens and should not be used as a sole basis for treatment. Nasal washings and aspirates are unacceptable for Xpert Xpress SARS-CoV-2/FLU/RSV testing.  Fact Sheet for Patients: EntrepreneurPulse.com.au  Fact Sheet for Healthcare Providers: IncredibleEmployment.be  This test is not yet approved or cleared by the [place] FDA and has been authorized for detection and/or diagnosis of SARS-CoV-2 by FDA under an Emergency Use Authorization (EUA). This EUA will remain in effect (meaning this test can be used) for the duration of the COVID-19 declaration under Section 564(b)(1) of the Act, 21 U.S.C. section 360bbb-3(b)(1), unless the authorization is terminated or revoked.  Performed at [facility], [address], [place], [place] [address]   Culture, blood (routine x 2)     Status: None (Preliminary result)   Collection Time: 09/23/20  4:09 PM   Specimen: BLOOD RIGHT FOREARM  Result Value Ref Range Status   Specimen Description BLOOD RIGHT FOREARM  Final   Special Requests   Final    Blood Culture results may not be optimal due to an inadequate volume of blood received in culture bottles BOTTLES DRAWN AEROBIC AND ANAEROBIC   Culture   Final    NO GROWTH < 24 HOURS Performed at [facility], [address] [place], [place] [address]    Report Status PENDING  Incomplete  Culture, blood (routine x 2)     Status: None (Preliminary result)   Collection Time: 09/23/20  4:09 PM   Specimen: BLOOD  Result Value Ref Range Status   Specimen Description BLOOD LEFT ANTECUBITAL  Final   Special Requests   Final    Blood Culture adequate volume BOTTLES DRAWN AEROBIC AND ANAEROBIC   Culture   Final    NO GROWTH < 24 HOURS Performed at [facility], [address] [place], [place] [address]    Report Status PENDING  Incomplete  MRSA PCR  Screening     Status: None   Collection Time: 09/23/20  5:05 PM   Specimen: Nasopharyngeal  Result Value Ref Range Status   MRSA by PCR NEGATIVE NEGATIVE Final    Comment:        The GeneXpert MRSA Assay (FDA approved for NASAL specimens only), is one component of a comprehensive MRSA colonization surveillance program. It is not intended to diagnose MRSA infection nor to guide or monitor treatment for MRSA infections. Performed at [facility], [address] [place], [place] [address]    Creatinine: Recent Labs    09/23/20 1132  09/23/20 1609 09/24/20 0510  CREATININE 3.16* 3.00* 3.00*   Baseline Creatinine: 1.9  Impression/Assessment:  55yo wit bilateral hydronephrosis with indwelling uretersal stents  Plan:  1. I discussed the management of bilateral hydronephrosis with indwelling ureteral stents in the setting of acute renal failure. The patient will have a foley catheter placed and urine output closely monitored. If her creatinine fails to improve in the next 48 hours please obtain repeat CT stone study. If the repeat stone study shows persistent bilateral hydronephrosis then the patient will need bilateral nephrostomy tube placement.   Nicolette Bang 09/24/2020, 8:22 AM

## 2020-09-24 NOTE — Progress Notes (Signed)
PROGRESS NOTE    Tanya Wright  JXB:[id number] DOB: [birth year] DOA: 09/23/2020 PCP: System, Provider Not In    Brief Narrative:  55 year old female with a history of cervical cancer status post radiation therapy and bilateral distal ureteral obstruction managed with bilateral ureteral stents.  She is followed at [facility] urology and oncology.  Last stent exchange was 05/2020.  She is admitted to the hospital with sepsis secondary to urinary source.  She is also noted to have acute kidney injury and bilateral hydronephrosis.  Urology following.  Foley catheter placed for bladder decompression.  May need to consider percutaneous nephrostomies if renal function does not improve   Assessment & Plan:   Principal Problem:   UTI (urinary tract infection) Active Problems:   Uterine cervix cancer (HCC)   CKD (chronic kidney disease)   Anemia in other chronic diseases classified elsewhere   Sepsis ([place])   Sepsis due to urinary tract infection -Patient noted to be febrile, tachycardic, with source of infection being urine and evidence of endorgan damage with acute kidney injury -Currently on Rocephin -She did have 1 out of 2 blood cultures positive for gram-positive cocci -Continue to monitor for now, this may be contaminant -Currently, she appears to be stable -Follow-up urine and blood cultures  Acute kidney injury on chronic kidney disease stage IIIa -Bilateral hydronephrosis with presence of double-J stents -Seen by urology with recommendations for continued IV hydration through the weekend and decompression of bladder with Foley catheter -If she does not have any improvement of renal function, may need to have bilateral nephrostomies placed -Baseline creatinine 1.97 -Creatinine on admission noted to be 3.1  Stage IV cervical cancer -She is followed at [facility] as an outpatient  Anemia of chronic disease -No signs of bleeding -Follow  hemoglobin  Hypothyroidism -Continue on Synthroid  Chronic pain syndrome -Continued home regimen of oxycodone and OxyContin  Depression -Continue on Prozac   DVT prophylaxis: heparin injection 5,000 Units Start: 09/23/20 1430 TED hose Start: 09/23/20 1416 SCDs Start: 09/23/20 1416  Code Status: Full code Family Communication: Discussed with patient Disposition Plan: Status is: Inpatient  Remains inpatient appropriate because:IV treatments appropriate due to intensity of illness or inability to take PO and Inpatient level of care appropriate due to severity of illness   Dispo: The patient is from: Home              Anticipated d/c is to: Home              Patient currently is not medically stable to d/c.   Difficult to place patient No         Consultants:   Urology  Procedures:     Antimicrobials:   Ceftriaxone 6/2 >   Subjective: She complains of headache.  Feels mildly better than she did yesterday.  Still noted to have fevers.  Objective: Vitals:   09/24/20 0700 09/24/20 0800 09/24/20 1000 09/24/20 1145  BP: 96/63 128/66    Pulse: (!) 110 (!) 123 96   Resp: (!) 22 (!) 24 11   Temp:  (!) 102.8 F (39.3 C)  99 F (37.2 C)  TempSrc:  Oral  Oral  SpO2: 91% 94% 97%   Weight:        Intake/Output Summary (Last 24 hours) at 09/24/2020 1415 Last data filed at 09/24/2020 0648 Gross per 24 hour  Intake 2631.94 ml  Output --  Net 2631.94 ml   Filed Weights   09/23/20 1057 09/24/20 0513  Weight: 84.8 kg 88.6 kg    Examination:  General exam: Appears calm and comfortable  Respiratory system: Clear to auscultation. Respiratory effort normal. Cardiovascular system: S1 & S2 heard, RRR. No JVD, murmurs, rubs, gallops or clicks. No pedal edema. Gastrointestinal system: Abdomen is nondistended, soft and nontender. No organomegaly or masses felt. Normal bowel sounds heard. Central nervous system: Alert and oriented. No focal neurological  deficits. Extremities: Symmetric 5 x 5 power. Skin: No rashes, lesions or ulcers Psychiatry: Judgement and insight appear normal. Mood & affect appropriate.     Data Reviewed: I have personally reviewed following labs and imaging studies  CBC: Recent Labs  Lab 09/23/20 1132 09/23/20 1609 09/24/20 0510  WBC 11.3* 11.2* 9.1  NEUTROABS  --  10.1*  --   HGB 12.4 11.0* 10.5*  HCT 37.6 34.2* 32.2*  MCV 97.4 97.4 97.3  PLT 171 155 397*   Basic Metabolic Panel: Recent Labs  Lab 09/23/20 1132 09/23/20 1609 09/24/20 0510  NA 132* 134* 132*  K 3.7 4.4 4.0  CL 96* 101 100  CO2 24 24 24   GLUCOSE 142* 134* 102*  BUN 38* 40* 39*  CREATININE 3.16* 3.00* 3.00*  CALCIUM 8.2* 7.6* 7.6*   GFR: CrCl cannot be calculated (Unknown ideal weight.). Liver Function Tests: Recent Labs  Lab 09/23/20 1609  AST 26  ALT 23  ALKPHOS 67  BILITOT 1.1  PROT 6.6  ALBUMIN 2.8*   No results for input(s): LIPASE, AMYLASE in the last 168 hours. No results for input(s): AMMONIA in the last 168 hours. Coagulation Profile: Recent Labs  Lab 09/23/20 1609 09/24/20 0510  INR 1.2 1.2   Cardiac Enzymes: No results for input(s): CKTOTAL, CKMB, CKMBINDEX, TROPONINI in the last 168 hours. BNP (last 3 results) No results for input(s): PROBNP in the last 8760 hours. HbA1C: No results for input(s): HGBA1C in the last 72 hours. CBG: Recent Labs  Lab 09/24/20 0811  GLUCAP 99   Lipid Profile: No results for input(s): CHOL, HDL, LDLCALC, TRIG, CHOLHDL, LDLDIRECT in the last 72 hours. Thyroid Function Tests: No results for input(s): TSH, T4TOTAL, FREET4, T3FREE, THYROIDAB in the last 72 hours. Anemia Panel: No results for input(s): VITAMINB12, FOLATE, FERRITIN, TIBC, IRON, RETICCTPCT in the last 72 hours. Sepsis Labs: Recent Labs  Lab 09/23/20 1609 09/23/20 2226  LATICACIDVEN 1.1 1.0    Recent Results (from the past 240 hour(s))  Resp Panel by RT-PCR (Flu A&B, Covid) Nasopharyngeal Swab      Status: None   Collection Time: 09/23/20 11:23 AM   Specimen: Nasopharyngeal Swab; Nasopharyngeal(NP) swabs in vial transport medium  Result Value Ref Range Status   SARS Coronavirus 2 by RT PCR NEGATIVE NEGATIVE Final    Comment: (NOTE) SARS-CoV-2 target nucleic acids are NOT DETECTED.  The SARS-CoV-2 RNA is generally detectable in upper respiratory specimens during the acute phase of infection. The lowest concentration of SARS-CoV-2 viral copies this assay can detect is 138 copies/mL. A negative result does not preclude SARS-Cov-2 infection and should not be used as the sole basis for treatment or other patient management decisions. A negative result may occur with  improper specimen collection/handling, submission of specimen other than nasopharyngeal swab, presence of viral mutation(s) within the areas targeted by this assay, and inadequate number of viral copies(<138 copies/mL). A negative result must be combined with clinical observations, patient history, and epidemiological information. The expected result is Negative.  Fact Sheet for Patients:  EntrepreneurPulse.com.au  Fact Sheet for Healthcare Providers:  IncredibleEmployment.be  This test is no t yet approved or cleared by the [place] and  has been authorized for detection and/or diagnosis of SARS-CoV-2 by FDA under an Emergency Use Authorization (EUA). This EUA will remain  in effect (meaning this test can be used) for the duration of the COVID-19 declaration under Section 564(b)(1) of the Act, 21 U.S.C.section 360bbb-3(b)(1), unless the authorization is terminated  or revoked sooner.       Influenza A by PCR NEGATIVE NEGATIVE Final   Influenza B by PCR NEGATIVE NEGATIVE Final    Comment: (NOTE) The Xpert Xpress SARS-CoV-2/FLU/RSV plus assay is intended as an aid in the diagnosis of influenza from Nasopharyngeal swab specimens and should not be used as a sole basis  for treatment. Nasal washings and aspirates are unacceptable for Xpert Xpress SARS-CoV-2/FLU/RSV testing.  Fact Sheet for Patients: EntrepreneurPulse.com.au  Fact Sheet for Healthcare Providers: IncredibleEmployment.be  This test is not yet approved or cleared by the [place] FDA and has been authorized for detection and/or diagnosis of SARS-CoV-2 by FDA under an Emergency Use Authorization (EUA). This EUA will remain in effect (meaning this test can be used) for the duration of the COVID-19 declaration under Section 564(b)(1) of the Act, 21 U.S.C. section 360bbb-3(b)(1), unless the authorization is terminated or revoked.  Performed at [facility], [address] [place], [place] [address]   Culture, blood (routine x 2)     Status: None (Preliminary result)   Collection Time: 09/23/20  4:09 PM   Specimen: BLOOD RIGHT FOREARM  Result Value Ref Range Status   Specimen Description   Final    BLOOD RIGHT FOREARM Performed at [facility], [address], [place], [place] [address]    Special Requests   Final    Blood Culture results may not be optimal due to an inadequate volume of blood received in culture bottles BOTTLES DRAWN AEROBIC AND ANAEROBIC Performed at [facility], [address], [place], [place] [address]    Culture  Setup Time   Final    GRAM POSITIVE COCCI BOTH AEROBIC AND ANAEROBIC BOTTLES Gram Stain Report Called to,Read Back By and Verified With: [facility]  09/24/20 @ 1011 BY S BEARD Organism ID to follow Performed at [facility], [place] [address] [place], [place] [address]    Culture PENDING  Incomplete   Report Status PENDING  Incomplete  Culture, blood (routine x 2)     Status: None (Preliminary result)   Collection Time: 09/23/20  4:09 PM   Specimen: BLOOD  Result Value Ref Range Status   Specimen Description BLOOD LEFT ANTECUBITAL  Final   Special Requests   Final    Blood Culture adequate volume BOTTLES  DRAWN AEROBIC AND ANAEROBIC   Culture   Final    NO GROWTH < 24 HOURS Performed at [facility], [address], [place],  [address]    Report Status PENDING  Incomplete  MRSA PCR Screening     Status: None   Collection Time: 09/23/20  5:05 PM   Specimen: Nasopharyngeal  Result Value Ref Range Status   MRSA by PCR NEGATIVE NEGATIVE Final    Comment:        The GeneXpert MRSA Assay (FDA approved for NASAL specimens only), is one component of a comprehensive MRSA colonization surveillance program. It is not intended to diagnose MRSA infection nor to guide or monitor treatment for MRSA infections. Performed at [facility], [address], [place],  [address]  Radiology Studies: US Renal  Result Date: 09/23/2020 CLINICAL DATA:  Worsening serum creatinine.  Bilateral renal stents EXAM: RENAL / URINARY TRACT ULTRASOUND COMPLETE COMPARISON:  09/23/2020 FINDINGS: Right Kidney: Renal measurements: 12.8 x 5.0 x 5.1 cm = volume: 172 mL. Increased renal cortical echogenicity. Mild hydronephrosis. No mass or shadowing stone visualized. Left Kidney: Renal measurements: 12.5 x 6.5 x 5.4 cm = volume: 228 mL. Increased renal cortical echogenicity. Mild hydronephrosis. No mass or shadowing stone visualized. Bladder: Bilateral ureteral stents are present. Bladder appears otherwise unremarkable for the degree of distension. Other: None. IMPRESSION: 1. Mild bilateral hydronephrosis. Bilateral nephroureteral stents are in place, better evaluated on recently obtained CT. 2. Increased renal cortical echogenicity suggesting underlying medical renal disease. Electronically Signed   By: Davina Poke D.O.   On: 09/23/2020 15:04   DG Chest Portable 1 View  Result Date: 09/23/2020 CLINICAL DATA:  55 year old female with chest pain on the right. Fever, chills, headache. History of cervical cancer. EXAM: PORTABLE CHEST 1 VIEW COMPARISON:  Chest radiographs 04/16/2017. FINDINGS: Portable AP  upright view at 1114 hours. Stable right chest Port-A-Cath. Allowing for portable technique lung volumes and mediastinal contours remain within normal limits, lungs are clear. No pneumothorax. Interval thyroidectomy, with resolved rightward shift of the trachea at the thoracic inlet. No acute osseous abnormality identified. Paucity of bowel gas in the upper abdomen. IMPRESSION: No acute cardiopulmonary abnormality. Electronically Signed   By: Genevie Ann M.D.   On: 09/23/2020 11:21   CT Renal Stone Study  Result Date: 09/23/2020 CLINICAL DATA:  Right flank pain and hematuria for the past day. History of cervical cancer. EXAM: CT ABDOMEN AND PELVIS WITHOUT CONTRAST TECHNIQUE: Multidetector CT imaging of the abdomen and pelvis was performed following the standard protocol without IV contrast. COMPARISON:  CT abdomen pelvis dated February 04, 2019. FINDINGS: Lower chest: No acute abnormality. Hepatobiliary: No focal liver abnormality is seen. No gallstones, gallbladder wall thickening, or biliary dilatation. Pancreas: Unchanged small calcifications in the pancreatic head consistent with chronic pancreatitis. No ductal dilatation or surrounding inflammatory changes. Spleen: Normal in size without focal abnormality. Adrenals/Urinary Tract: Adrenal glands are unremarkable. Bilateral double-J ureteral stents again noted and appropriately positioned. Chronic bilateral hydronephrosis, mildly improved compared to prior study. No renal calculi. Mild circumferential bladder wall thickening. Stomach/Bowel: Stomach is within normal limits. Appendix appears normal. No evidence of bowel wall thickening, distention, or inflammatory changes. Vascular/Lymphatic: Chronic densely calcified retroperitoneal and pelvic lymphadenopathy, similar to prior study. Aortic atherosclerosis. Reproductive: Improved but continued distension of the endometrial canal, which measures up to 1.9 cm (series 6, image 55), previously 3.6 cm. No adnexal mass.  Other: Trace free fluid in the pelvis.  No pneumoperitoneum. Musculoskeletal: No acute or significant osseous findings. IMPRESSION: 1. No acute intra-abdominal process. 2. Bilateral double-J ureteral stents with chronic bilateral hydronephrosis, mildly improved compared to prior study. 3. Improved but continued distension of the endometrial canal, which measures up to 1.9 cm, previously 3.6 cm. This may reflect obstruction related to cervical cancer/post treatment changes. Consider pelvic ultrasound for further evaluation. 4. Chronic densely calcified retroperitoneal and pelvic lymphadenopathy, similar to prior study, consistent with treated metastatic disease. 5. Aortic Atherosclerosis (ICD10-I70.0). Electronically Signed   By: Titus Dubin M.D.   On: 09/23/2020 13:13        Scheduled Meds: . calcium acetate  1,334 mg Oral TID WC  . Chlorhexidine Gluconate Cloth  6 each Topical Q0600  . FLUoxetine  10 mg Oral Daily  . heparin  5,000  Units Subcutaneous Q8H  . levothyroxine  175 mcg Oral Daily  . oxyCODONE  10 mg Oral Q12H  . polyethylene glycol  17 g Oral Daily  . senna  1 tablet Oral QHS  . sodium chloride flush  3 mL Intravenous Q12H  . sodium chloride flush  3 mL Intravenous Q12H   Continuous Infusions: . sodium chloride Stopped (09/23/20 1553)  . sodium chloride    . cefTRIAXone (ROCEPHIN)  IV 2 g (09/24/20 1318)     LOS: 1 day    Time spent: 62mins    Kathie Dike, MD [facility]   If 7PM-7AM, please contact night-coverage [email or web address]  09/24/2020, 2:15 PM

## 2020-09-24 NOTE — Evaluation (Signed)
Occupational Therapy Evaluation Patient Details Name: Tanya Wright MRN: [id number] DOB: [birth year] Today's Date: 09/24/2020    History of Present Illness Tanya Wright is a 55 y.o. female with medical history significant of stage IV cervical cancer on immunotherapy, HTN, hypothyroidism, chronic bilateral ureteral stent, with hydronephrosis presenting with chief complaint of fever, chills, headaches, congestion, body aches, stomach pain, nausea, vomiting.  1 episode of diarrhea nonbloody   Clinical Impression   Pt in bed upon arrival. Pt agreeable to OT evaluation requesting deodorant and mesh undergarments. Pt able to complete transfer from EOB to standing at the sink and back independently. Pt demonstrates UE strength WNL. Pt able to complete bed mobility independently. Primary complain from pt is a headache at 8/10 on the pain scale. Pt is not recommended for continued acute OT services and will be discharged to care of nursing staff for the remainder of her stay.     Follow Up Recommendations  No OT follow up    Equipment Recommendations  None recommended by OT           Precautions / Restrictions Precautions Precautions: None Restrictions Weight Bearing Restrictions: No      Mobility Bed Mobility Overal bed mobility: Independent                  Transfers Overall transfer level: Independent Equipment used: None                  Balance Overall balance assessment: Independent                                         ADL either performed or assessed with clinical judgement   ADL Overall ADL's : Independent                                       General ADL Comments: Able to walk to sink and complete application of deodorant and washing hands.     Vision Baseline Vision/History: Wears glasses Wears Glasses: At all times Patient Visual Report: No change from baseline                  Pertinent Vitals/Pain Pain  Assessment: 0-10 Pain Score: 8  Pain Location: head Pain Descriptors / Indicators: Headache Pain Intervention(s): Monitored during session;Premedicated before session     Hand Dominance Right   Extremity/Trunk Assessment Upper Extremity Assessment Upper Extremity Assessment: Overall WFL for tasks assessed   Lower Extremity Assessment Lower Extremity Assessment: Defer to PT evaluation   Cervical / Trunk Assessment Cervical / Trunk Assessment: Normal   Communication Communication Communication: No difficulties   Cognition Arousal/Alertness: Awake/alert Behavior During Therapy: WFL for tasks assessed/performed Overall Cognitive Status: Within Functional Limits for tasks assessed                                                      Home Living Family/patient expects to be discharged to:: Private residence Living Arrangements: Alone Available Help at Discharge: Family;Available PRN/intermittently Type of Home: Apartment Home Access: Level entry     Home Layout: One level     Bathroom Shower/Tub: Tub/shower unit   [employer]  Toilet: Standard     Home Equipment: None          Prior Functioning/Environment Level of Independence: Independent        Comments: Pt does not drive.                      OT Goals(Current goals can be found in the care plan section) Acute Rehab OT Goals Patient Stated Goal: return home  OT Frequency:                       End of Session Nurse Communication: Mobility status;Other (comment) (Pt requesting pitcher of water.)  Activity Tolerance: Patient tolerated treatment well Patient left: in bed;with call bell/phone within reach  OT Visit Diagnosis: Unsteadiness on feet (R26.81)                Time: 7342-8768 OT Time Calculation (min): 15 min Charges:  OT General Charges $OT Visit: 1 Visit OT Evaluation $OT Eval Low Complexity: Bland OT, MOT   Larey Seat 09/24/2020, 10:05 AM

## 2020-09-24 NOTE — Progress Notes (Signed)
CRITICAL VALUE STICKER  CRITICAL VALUE:Gram Positive Cocci in Anerobic Bottle  RECEIVER (on-site recipient of call): Jorge Mandril, RN  DATE & TIME NOTIFIED: [phone] 09/24/20  MESSENGER (representative from lab): Colletta Maryland  MD NOTIFIED: Dr Roderic Palau  TIME OF NOTIFICATION:1016  RESPONSE: see orders

## 2020-09-24 NOTE — Progress Notes (Addendum)
Physical Therapy Evaluation Patient Details Name: Tanya Wright MRN: [id number] DOB: Apr 25, 1965 Today's Date: 09/24/2020   History of Present Illness  Tanya Wright is a 55 y.o. female with medical history significant of stage IV cervical cancer on immunotherapy, HTN, hypothyroidism, chronic bilateral ureteral stent, with hydronephrosis presenting with chief complaint of fever, chills, headaches, congestion, body aches, stomach pain, nausea, vomiting.  1 episode of diarrhea nonbloody    Clinical Impression  Patients alert and oriented in bed and agreeable to therapy. Patient presents at baseline for function for ADLs. Patient was able to ambulate with reciprocal gait pattern for 100 feet without need of AD.   Patient discharged to care of nursing for ambulation daily as tolerated for length of stay.     Follow Up Recommendations No PT follow up    Equipment Recommendations  None recommended by PT    Recommendations for Other Services       Precautions / Restrictions Precautions Precautions: None Restrictions Weight Bearing Restrictions: No      Mobility  Bed Mobility Overal bed mobility: Independent               Patient Response: Cooperative  Transfers Overall transfer level: Independent Equipment used: None                Ambulation/Gait Ambulation/Gait assistance: Independent Gait Distance (Feet): 100 Feet Assistive device: None Gait Pattern/deviations: WFL(Within Functional Limits) Gait velocity: normal      Stairs            Wheelchair Mobility    Modified Rankin (Stroke Patients Only)       Balance Overall balance assessment: Independent                                           Pertinent Vitals/Pain Pain Assessment: Faces Pain Score: 8  Faces Pain Scale: Hurts a little bit Pain Location: head Pain Descriptors / Indicators: Headache Pain Intervention(s): Monitored during session;Premedicated before session     Home Living Family/patient expects to be discharged to:: Private residence Living Arrangements: Alone Available Help at Discharge: Family;Available PRN/intermittently Type of Home: Apartment Home Access: Level entry     Home Layout: One level Home Equipment: None      Prior Function Level of Independence: Independent         Comments: Pt does not drive.     Hand Dominance   Dominant Hand: Right    Extremity/Trunk Assessment   Upper Extremity Assessment Upper Extremity Assessment: Defer to OT evaluation    Lower Extremity Assessment Lower Extremity Assessment: Overall WFL for tasks assessed    Cervical / Trunk Assessment Cervical / Trunk Assessment: Normal  Communication   Communication: No difficulties  Cognition Arousal/Alertness: Awake/alert Behavior During Therapy: WFL for tasks assessed/performed Overall Cognitive Status: Within Functional Limits for tasks assessed                                        General Comments      Exercises     Assessment/Plan    PT Assessment Patent does not need any further PT services  PT Problem List         PT Treatment Interventions      PT Goals (Current goals can be found in the Care Plan section)  Acute Rehab PT Goals Patient Stated Goal: return home PT Goal Formulation: With patient Time For Goal Achievement: 09/24/20 Potential to Achieve Goals: Good    Frequency     Barriers to discharge        Co-evaluation               AM-PAC PT "6 Clicks" Mobility  Outcome Measure Help needed turning from your back to your side while in a flat bed without using bedrails?: None Help needed moving from lying on your back to sitting on the side of a flat bed without using bedrails?: None Help needed moving to and from a bed to a chair (including a wheelchair)?: None Help needed standing up from a chair using your arms (e.g., wheelchair or bedside chair)?: None Help needed to walk in  hospital room?: None Help needed climbing 3-5 steps with a railing? : None 6 Click Score: 24    End of Session   Activity Tolerance: Patient tolerated treatment well Patient left: in bed;with call bell/phone within reach   PT Visit Diagnosis: Unsteadiness on feet (R26.81);Other abnormalities of gait and mobility (R26.89);Muscle weakness (generalized) (M62.81)    Time: 0630-1601 PT Time Calculation (min) (ACUTE ONLY): 15 min   Charges:   PT Evaluation $PT Eval Low Complexity: 1 Low PT Treatments $Therapeutic Activity: 8-22 mins        11:40 AM, 09/24/20 Jeneen Rinks Cousler SPT  11:40 AM, 09/24/20 Lonell Grandchild, MPT Physical Therapist with [facility] [phone] office [phone] mobile phone

## 2020-09-25 ENCOUNTER — Encounter (HOSPITAL_COMMUNITY): Payer: Self-pay | Admitting: Family Medicine

## 2020-09-25 DIAGNOSIS — N179 Acute kidney failure, unspecified: Secondary | ICD-10-CM

## 2020-09-25 LAB — BASIC METABOLIC PANEL
Anion gap: 9 (ref 5–15)
BUN: 39 mg/dL — ABNORMAL HIGH (ref 6–20)
CO2: 22 mmol/L (ref 22–32)
Calcium: 7.2 mg/dL — ABNORMAL LOW (ref 8.9–10.3)
Chloride: 102 mmol/L (ref 98–111)
Creatinine, Ser: 2.96 mg/dL — ABNORMAL HIGH (ref 0.44–1.00)
GFR, Estimated: 18 mL/min — ABNORMAL LOW (ref >60)
Glucose, Bld: 100 mg/dL — ABNORMAL HIGH (ref 70–99)
Potassium: 3.6 mmol/L (ref 3.5–5.1)
Sodium: 133 mmol/L — ABNORMAL LOW (ref 135–145)

## 2020-09-25 LAB — CBC
HCT: 30.8 % — ABNORMAL LOW (ref 36.0–46.0)
Hemoglobin: 10 g/dL — ABNORMAL LOW (ref 12.0–15.0)
MCH: 31.3 pg (ref 26.0–34.0)
MCHC: 32.5 g/dL (ref 30.0–36.0)
MCV: 96.3 fL (ref 80.0–100.0)
Platelets: 134 10*3/uL — ABNORMAL LOW (ref 150–400)
RBC: 3.2 MIL/uL — ABNORMAL LOW (ref 3.87–5.11)
RDW: 14.8 % (ref 11.5–15.5)
WBC: 6.7 10*3/uL (ref 4.0–10.5)
nRBC: 0 % (ref 0.0–0.2)

## 2020-09-25 LAB — GLUCOSE, CAPILLARY: Glucose-Capillary: 97 mg/dL (ref 70–99)

## 2020-09-25 MED ORDER — TEMAZEPAM 15 MG PO CAPS
15.0000 mg | ORAL_CAPSULE | Freq: Once | ORAL | Status: DC
  Filled 2020-09-25: qty 1

## 2020-09-25 MED ORDER — CEFAZOLIN SODIUM-DEXTROSE 2-4 GM/100ML-% IV SOLN
2.0000 g | Freq: Two times a day (BID) | INTRAVENOUS | Status: DC
  Administered 2020-09-26 – 2020-09-28 (×5): 2 g via INTRAVENOUS
  Filled 2020-09-25 (×5): qty 100

## 2020-09-25 MED ORDER — CEFAZOLIN SODIUM-DEXTROSE 2-4 GM/100ML-% IV SOLN
2.0000 g | Freq: Once | INTRAVENOUS | Status: AC
  Administered 2020-09-25: 2 g via INTRAVENOUS
  Filled 2020-09-25: qty 100

## 2020-09-25 NOTE — Progress Notes (Signed)
Case reviewed with Dr. Juleen China on-call for infectious disease.  Cultures reviewed.  Since patient is growing MSSA in 1 set of blood cultures, will change Rocephin to Ancef.  Urine culture has been sent today and is in process.  The patient is also noted to have a port for chemotherapy which will also likely need to be removed.  Port has not been accessed during this hospitalization and skin overlying port appears to be clean.  Repeat blood cultures ordered for a.m.  Echocardiogram has also been ordered.  Tanya Wright

## 2020-09-25 NOTE — Progress Notes (Signed)
PROGRESS NOTE    Tanya Wright  SWH:[id number] DOB: 06/27/1965 DOA: 09/23/2020 PCP: System, Provider Not In    Brief Narrative:  55 year old female with a history of cervical cancer status post radiation therapy and bilateral distal ureteral obstruction managed with bilateral ureteral stents.  She is followed at [facility] urology and oncology.  Last stent exchange was 05/2020.  She is admitted to the hospital with sepsis secondary to urinary source.  She is also noted to have acute kidney injury and bilateral hydronephrosis.  Urology following.  Foley catheter placed for bladder decompression.  May need to consider percutaneous nephrostomies if renal function does not improve   Assessment & Plan:   Principal Problem:   UTI (urinary tract infection) Active Problems:   Uterine cervix cancer (HCC)   CKD (chronic kidney disease)   Anemia in other chronic diseases classified elsewhere   Sepsis ([place])   Sepsis due to urinary tract infection -Patient noted to be febrile, tachycardic, with source of infection being urine and evidence of endorgan damage with acute kidney injury -Currently on Rocephin -She did have 1 out of 2 blood cultures positive for gram-positive cocci -BCID indicates MSSA -will discuss with ID -Currently, she appears to be stable -Follow-up urine and blood cultures  Acute kidney injury on chronic kidney disease stage IIIa -Bilateral hydronephrosis with presence of double-J stents -Seen by urology with recommendations for continued IV hydration through the weekend and decompression of bladder with Foley catheter -If she does not have any improvement of renal function, may need to have bilateral nephrostomies placed -Baseline creatinine 1.97 -Creatinine on admission noted to be 3.1 -patient had foley placed and had 1600cc urine output yesterday -creatinine 2.9 today -continue to follow  Stage IV cervical cancer -She is followed at [facility] -Continue follow-up as an  outpatient  Anemia of chronic disease -No signs of bleeding -Follow hemoglobin  Hypothyroidism -Continue on Synthroid  Chronic pain syndrome -Continued home regimen of oxycodone and OxyContin  Depression -Continue on Prozac   DVT prophylaxis: heparin injection 5,000 Units Start: 09/23/20 1430 TED hose Start: 09/23/20 1416 SCDs Start: 09/23/20 1416  Code Status: Full code Family Communication: Discussed with patient Disposition Plan: Status is: Inpatient  Remains inpatient appropriate because:IV treatments appropriate due to intensity of illness or inability to take PO and Inpatient level of care appropriate due to severity of illness   Dispo: The patient is from: Home              Anticipated d/c is to: Home              Patient currently is not medically stable to d/c.   Difficult to place patient No         Consultants:   Urology  Procedures:     Antimicrobials:   Ceftriaxone 6/2 >   Subjective: Continued to have fevers overnight. She does not have any cough or shortness of breath. Overall pain is reasonably controlled  Objective: Vitals:   09/25/20 1000 09/25/20 1100 09/25/20 1143 09/25/20 1212  BP: 117/82     Pulse: 94 94 (!) 107   Resp: 18 (!) 24 (!) 38   Temp:   (!) 100.6 F (38.1 C) 99.5 F (37.5 C)  TempSrc:   Oral Oral  SpO2: 100% 100% 90%   Weight:        Intake/Output Summary (Last 24 hours) at 09/25/2020 1350 Last data filed at 09/25/2020 0200 Gross per 24 hour  Intake 803 ml  Output  1600 ml  Net -797 ml   Filed Weights   09/23/20 1057 09/24/20 0513 09/25/20 0800  Weight: 84.8 kg 88.6 kg 90.9 kg    Examination:  General exam: Alert, awake, oriented x 3 Respiratory system: Clear to auscultation. Respiratory effort normal. Cardiovascular system:RRR. No murmurs, rubs, gallops. Gastrointestinal system: Abdomen is nondistended, soft and nontender. No organomegaly or masses felt. Normal bowel sounds heard. Central nervous  system: Alert and oriented. No focal neurological deficits. Extremities: No C/C/E, +pedal pulses Skin: No rashes, lesions or ulcers Psychiatry: Judgement and insight appear normal. Mood & affect appropriate.    Data Reviewed: I have personally reviewed following labs and imaging studies  CBC: Recent Labs  Lab 09/23/20 1132 09/23/20 1609 09/24/20 0510 09/25/20 0521  WBC 11.3* 11.2* 9.1 6.7  NEUTROABS  --  10.1*  --   --   HGB 12.4 11.0* 10.5* 10.0*  HCT 37.6 34.2* 32.2* 30.8*  MCV 97.4 97.4 97.3 96.3  PLT 171 155 143* 323*   Basic Metabolic Panel: Recent Labs  Lab 09/23/20 1132 09/23/20 1609 09/24/20 0510 09/25/20 0521  NA 132* 134* 132* 133*  K 3.7 4.4 4.0 3.6  CL 96* 101 100 102  CO2 24 24 24 22   GLUCOSE 142* 134* 102* 100*  BUN 38* 40* 39* 39*  CREATININE 3.16* 3.00* 3.00* 2.96*  CALCIUM 8.2* 7.6* 7.6* 7.2*   GFR: CrCl cannot be calculated (Unknown ideal weight.). Liver Function Tests: Recent Labs  Lab 09/23/20 1609  AST 26  ALT 23  ALKPHOS 67  BILITOT 1.1  PROT 6.6  ALBUMIN 2.8*   No results for input(s): LIPASE, AMYLASE in the last 168 hours. No results for input(s): AMMONIA in the last 168 hours. Coagulation Profile: Recent Labs  Lab 09/23/20 1609 09/24/20 0510  INR 1.2 1.2   Cardiac Enzymes: No results for input(s): CKTOTAL, CKMB, CKMBINDEX, TROPONINI in the last 168 hours. BNP (last 3 results) No results for input(s): PROBNP in the last 8760 hours. HbA1C: No results for input(s): HGBA1C in the last 72 hours. CBG: Recent Labs  Lab 09/24/20 0811 09/25/20 0746  GLUCAP 99 97   Lipid Profile: No results for input(s): CHOL, HDL, LDLCALC, TRIG, CHOLHDL, LDLDIRECT in the last 72 hours. Thyroid Function Tests: No results for input(s): TSH, T4TOTAL, FREET4, T3FREE, THYROIDAB in the last 72 hours. Anemia Panel: No results for input(s): VITAMINB12, FOLATE, FERRITIN, TIBC, IRON, RETICCTPCT in the last 72 hours. Sepsis Labs: Recent Labs  Lab  09/23/20 1609 09/23/20 2226  LATICACIDVEN 1.1 1.0    Recent Results (from the past 240 hour(s))  Resp Panel by RT-PCR (Flu A&B, Covid) Nasopharyngeal Swab     Status: None   Collection Time: 09/23/20 11:23 AM   Specimen: Nasopharyngeal Swab; Nasopharyngeal(NP) swabs in vial transport medium  Result Value Ref Range Status   SARS Coronavirus 2 by RT PCR NEGATIVE NEGATIVE Final    Comment: (NOTE) SARS-CoV-2 target nucleic acids are NOT DETECTED.  The SARS-CoV-2 RNA is generally detectable in upper respiratory specimens during the acute phase of infection. The lowest concentration of SARS-CoV-2 viral copies this assay can detect is 138 copies/mL. A negative result does not preclude SARS-Cov-2 infection and should not be used as the sole basis for treatment or other patient management decisions. A negative result may occur with  improper specimen collection/handling, submission of specimen other than nasopharyngeal swab, presence of viral mutation(s) within the areas targeted by this assay, and inadequate number of viral copies(<138 copies/mL). A negative result must  be combined with clinical observations, patient history, and epidemiological information. The expected result is Negative.  Fact Sheet for Patients:  EntrepreneurPulse.com.au  Fact Sheet for Healthcare Providers:  IncredibleEmployment.be  This test is no t yet approved or cleared by the [place] FDA and  has been authorized for detection and/or diagnosis of SARS-CoV-2 by FDA under an Emergency Use Authorization (EUA). This EUA will remain  in effect (meaning this test can be used) for the duration of the COVID-19 declaration under Section 564(b)(1) of the Act, 21 U.S.C.section 360bbb-3(b)(1), unless the authorization is terminated  or revoked sooner.       Influenza A by PCR NEGATIVE NEGATIVE Final   Influenza B by PCR NEGATIVE NEGATIVE Final    Comment: (NOTE) The Xpert  Xpress SARS-CoV-2/FLU/RSV plus assay is intended as an aid in the diagnosis of influenza from Nasopharyngeal swab specimens and should not be used as a sole basis for treatment. Nasal washings and aspirates are unacceptable for Xpert Xpress SARS-CoV-2/FLU/RSV testing.  Fact Sheet for Patients: EntrepreneurPulse.com.au  Fact Sheet for Healthcare Providers: IncredibleEmployment.be  This test is not yet approved or cleared by the [place] FDA and has been authorized for detection and/or diagnosis of SARS-CoV-2 by FDA under an Emergency Use Authorization (EUA). This EUA will remain in effect (meaning this test can be used) for the duration of the COVID-19 declaration under Section 564(b)(1) of the Act, 21 U.S.C. section 360bbb-3(b)(1), unless the authorization is terminated or revoked.  Performed at [facility], [address], [place], [place] [address]   Culture, blood (routine x 2)     Status: Abnormal (Preliminary result)   Collection Time: 09/23/20  4:09 PM   Specimen: BLOOD RIGHT FOREARM  Result Value Ref Range Status   Specimen Description   Final    BLOOD RIGHT FOREARM Performed at [facility], [address] [place], [place] [address]    Special Requests   Final    Blood Culture results may not be optimal due to an inadequate volume of blood received in culture bottles BOTTLES DRAWN AEROBIC AND ANAEROBIC Performed at [facility], [address], [place], [place] [address]    Culture  Setup Time   Final    GRAM POSITIVE COCCI BOTH AEROBIC AND ANAEROBIC BOTTLES Gram Stain Report Called to,Read Back By and Verified With: [facility] CAULDER  09/24/20 @ 1011 BY S BEARD CRITICAL RESULT CALLED TO, READ BACK BY AND VERIFIED WITH: Golden Pop RN 1700 09/24/20 A BROWNING    Culture (A)  Final    STAPHYLOCOCCUS AUREUS SUSCEPTIBILITIES TO FOLLOW Performed at [facility], [place] [address], [place], [place] [address]    Report Status PENDING   Incomplete  Culture, blood (routine x 2)     Status: None (Preliminary result)   Collection Time: 09/23/20  4:09 PM   Specimen: BLOOD  Result Value Ref Range Status   Specimen Description BLOOD LEFT ANTECUBITAL  Final   Special Requests   Final    Blood Culture adequate volume BOTTLES DRAWN AEROBIC AND ANAEROBIC   Culture   Final    NO GROWTH 2 DAYS Performed at [facility], [address], [place], [place] [address]    Report Status PENDING  Incomplete  Blood Culture ID Panel (Reflexed)     Status: Abnormal   Collection Time: 09/23/20  4:09 PM  Result Value Ref Range Status   Enterococcus faecalis NOT DETECTED NOT DETECTED Final   Enterococcus Faecium NOT DETECTED NOT DETECTED Final  Listeria monocytogenes NOT DETECTED NOT DETECTED Final   Staphylococcus species DETECTED (A) NOT DETECTED Final    Comment: CRITICAL RESULT CALLED TO, READ BACK BY AND VERIFIED WITH: Golden Pop RN 1700 09/24/20 A BROWNING    Staphylococcus aureus (BCID) DETECTED (A) NOT DETECTED Final    Comment: CRITICAL RESULT CALLED TO, READ BACK BY AND VERIFIED WITH: Golden Pop RN 1700 09/24/20 A BROWNING    Staphylococcus epidermidis NOT DETECTED NOT DETECTED Final   Staphylococcus lugdunensis NOT DETECTED NOT DETECTED Final   Streptococcus species NOT DETECTED NOT DETECTED Final   Streptococcus agalactiae NOT DETECTED NOT DETECTED Final   Streptococcus pneumoniae NOT DETECTED NOT DETECTED Final   Streptococcus pyogenes NOT DETECTED NOT DETECTED Final   A.calcoaceticus-baumannii NOT DETECTED NOT DETECTED Final   Bacteroides fragilis NOT DETECTED NOT DETECTED Final   Enterobacterales NOT DETECTED NOT DETECTED Final   Enterobacter cloacae complex NOT DETECTED NOT DETECTED Final   Escherichia coli NOT DETECTED NOT DETECTED Final   Klebsiella aerogenes NOT DETECTED NOT DETECTED Final   Klebsiella oxytoca NOT DETECTED NOT DETECTED Final   Klebsiella pneumoniae NOT DETECTED NOT DETECTED Final   Proteus species NOT  DETECTED NOT DETECTED Final   Salmonella species NOT DETECTED NOT DETECTED Final   Serratia marcescens NOT DETECTED NOT DETECTED Final   Haemophilus influenzae NOT DETECTED NOT DETECTED Final   Neisseria meningitidis NOT DETECTED NOT DETECTED Final   Pseudomonas aeruginosa NOT DETECTED NOT DETECTED Final   Stenotrophomonas maltophilia NOT DETECTED NOT DETECTED Final   Candida albicans NOT DETECTED NOT DETECTED Final   Candida auris NOT DETECTED NOT DETECTED Final   Candida glabrata NOT DETECTED NOT DETECTED Final   Candida krusei NOT DETECTED NOT DETECTED Final   Candida parapsilosis NOT DETECTED NOT DETECTED Final   Candida tropicalis NOT DETECTED NOT DETECTED Final   Cryptococcus neoformans/gattii NOT DETECTED NOT DETECTED Final   Meth resistant mecA/C and MREJ NOT DETECTED NOT DETECTED Final    Comment: Performed at [facility], [address], [place], [place] [address]  MRSA PCR Screening     Status: None   Collection Time: 09/23/20  5:05 PM   Specimen: Nasopharyngeal  Result Value Ref Range Status   MRSA by PCR NEGATIVE NEGATIVE Final    Comment:        The GeneXpert MRSA Assay (FDA approved for NASAL specimens only), is one component of a comprehensive MRSA colonization surveillance program. It is not intended to diagnose MRSA infection nor to guide or monitor treatment for MRSA infections. Performed at [facility], [address] [place], [place] [address]          Radiology Studies: US Renal  Result Date: 09/23/2020 CLINICAL DATA:  Worsening serum creatinine.  Bilateral renal stents EXAM: RENAL / URINARY TRACT ULTRASOUND COMPLETE COMPARISON:  09/23/2020 FINDINGS: Right Kidney: Renal measurements: 12.8 x 5.0 x 5.1 cm = volume: 172 mL. Increased renal cortical echogenicity. Mild hydronephrosis. No mass or shadowing stone visualized. Left Kidney: Renal measurements: 12.5 x 6.5 x 5.4 cm = volume: 228 mL. Increased renal cortical echogenicity. Mild hydronephrosis.  No mass or shadowing stone visualized. Bladder: Bilateral ureteral stents are present. Bladder appears otherwise unremarkable for the degree of distension. Other: None. IMPRESSION: 1. Mild bilateral hydronephrosis. Bilateral nephroureteral stents are in place, better evaluated on recently obtained CT. 2. Increased renal cortical echogenicity suggesting underlying medical renal disease. Electronically Signed   By: Davina Poke D.O.   On: 09/23/2020 15:04  Scheduled Meds: . calcium acetate  1,334 mg Oral TID WC  . Chlorhexidine Gluconate Cloth  6 each Topical Q0600  . FLUoxetine  10 mg Oral Daily  . heparin  5,000 Units Subcutaneous Q8H  . levothyroxine  175 mcg Oral Daily  . oxyCODONE  10 mg Oral Q12H  . polyethylene glycol  17 g Oral Daily  . senna  1 tablet Oral QHS  . sodium chloride flush  3 mL Intravenous Q12H  . sodium chloride flush  3 mL Intravenous Q12H   Continuous Infusions: . sodium chloride 100 mL/hr at 09/25/20 1208  . sodium chloride    . cefTRIAXone (ROCEPHIN)  IV 2 g (09/25/20 1211)     LOS: 2 days    Time spent: 92mins    Kathie Dike, MD [facility]   If 7PM-7AM, please contact night-coverage [email or web address]  09/25/2020, 1:50 PM

## 2020-09-26 ENCOUNTER — Inpatient Hospital Stay (HOSPITAL_COMMUNITY): Payer: 59

## 2020-09-26 DIAGNOSIS — N1330 Unspecified hydronephrosis: Secondary | ICD-10-CM | POA: Diagnosis present

## 2020-09-26 DIAGNOSIS — R7881 Bacteremia: Secondary | ICD-10-CM

## 2020-09-26 LAB — URINE CULTURE: Culture: NO GROWTH

## 2020-09-26 LAB — ECHOCARDIOGRAM COMPLETE
Area-P 1/2: 3.3 cm2
S' Lateral: 3.33 cm
Weight: 3206.37 oz

## 2020-09-26 LAB — CULTURE, BLOOD (ROUTINE X 2)

## 2020-09-26 LAB — BASIC METABOLIC PANEL
Anion gap: 7 (ref 5–15)
BUN: 33 mg/dL — ABNORMAL HIGH (ref 6–20)
CO2: 23 mmol/L (ref 22–32)
Calcium: 7 mg/dL — ABNORMAL LOW (ref 8.9–10.3)
Chloride: 104 mmol/L (ref 98–111)
Creatinine, Ser: 2.84 mg/dL — ABNORMAL HIGH (ref 0.44–1.00)
GFR, Estimated: 19 mL/min — ABNORMAL LOW (ref >60)
Glucose, Bld: 106 mg/dL — ABNORMAL HIGH (ref 70–99)
Potassium: 3.7 mmol/L (ref 3.5–5.1)
Sodium: 134 mmol/L — ABNORMAL LOW (ref 135–145)

## 2020-09-26 LAB — CBC
HCT: 29.1 % — ABNORMAL LOW (ref 36.0–46.0)
Hemoglobin: 9.3 g/dL — ABNORMAL LOW (ref 12.0–15.0)
MCH: 30.9 pg (ref 26.0–34.0)
MCHC: 32 g/dL (ref 30.0–36.0)
MCV: 96.7 fL (ref 80.0–100.0)
Platelets: 133 10*3/uL — ABNORMAL LOW (ref 150–400)
RBC: 3.01 MIL/uL — ABNORMAL LOW (ref 3.87–5.11)
RDW: 14.9 % (ref 11.5–15.5)
WBC: 5.4 10*3/uL (ref 4.0–10.5)
nRBC: 0 % (ref 0.0–0.2)

## 2020-09-26 NOTE — Consult Note (Signed)
Infectious disease Staph aureus automatic consultation brief note:  55 year old woman with history of cervical cancer status post radiation therapy and bilateral distal ureteral obstruction managed with bilateral stents.  Followed at [facility] for urology and oncology care.  Her most recent stent exchange was in February 2022 and she was recently admitted to [facility] on 09/23/2020 with sepsis felt to be secondary to a urinary source with AKI and bilateral hydronephrosis.  She was evaluated by urology who recommended Foley catheter placement with close monitoring of her urine output and renal function.  It was felt that if her renal function did not improve over the next 48 hours then repeat imaging should be obtained and possibly she would need bilateral nephrostomy tube placement.  She was initially managed with ceftriaxone.  Admission blood cultures on 6/2 are positive for MSSA in 1 out of 2 cultures.  A urine culture is currently pending and repeat blood cultures drawn today are currently no growth.  She has been narrowed to cefazolin given her blood culture results.  She continues to be febrile with a T-max of 103 F earlier this morning at 3 AM.  Labs today show resolution of her leukocytosis, slight worsening of her thrombocytopenia, and relatively stagnant creatinine/GFR.  She has undergone 2D echocardiography which showed no obvious valvular vegetations.  Repeat renal stone study is pending.  Additionally she also has a port for chemotherapy that remains in place.  Recommend the following: -- Continue cefazolin dosed based on her current GFR -- In the setting of complicated MSSA bacteremia, despite negative TTE, would recommend TEE for further evaluation -- Follow-up repeat blood cultures and urine culture -- Follow-up CT renal stone study to determine if nephrostomy tubes will be needed -- Recommend port removal in the setting of staph aureus bacteremia -- Will continue to follow her chart  peripherally and make recommendations as needed -- Please do not hesitate to call with any questions   Raynelle Highland for Infectious Disease [facility] 09/26/2020, 12:57 PM

## 2020-09-26 NOTE — Progress Notes (Addendum)
PROGRESS NOTE    Tanya Wright  PQZ:[id number] DOB: 07/11/1965 DOA: 09/23/2020 PCP: System, Provider Not In    Brief Narrative:  55 year old female with a history of cervical cancer status post radiation therapy and bilateral distal ureteral obstruction managed with bilateral ureteral stents.  She is followed at [facility] urology and oncology.  Last stent exchange was 05/2020.  She is admitted to the hospital with sepsis secondary to urinary source.  She is also noted to have acute kidney injury and bilateral hydronephrosis.  Urology following.  Foley catheter placed for bladder decompression.  May need to consider percutaneous nephrostomies if renal function does not improve   Assessment & Plan:   Principal Problem:   UTI (urinary tract infection) Active Problems:   Uterine cervix cancer (HCC)   CKD (chronic kidney disease) stage 3, GFR 30-59 ml/min (HCC)   Anemia in other chronic diseases classified elsewhere   AKI (acute kidney injury) ([place])   Sepsis ([place])   MSSA bacteremia   Bilateral hydronephrosis   Sepsis due to MSSA bacteremia and urinary tract infection -Patient noted to be febrile, tachycardic, with source of infection being urine and evidence of endorgan damage with acute kidney injury -Initially on Rocephin -She did have 1 out of 2 blood cultures positive for MSSA - Discussed with Dr. Juleen China and antibiotics changed to Ancef -repeat cultures sent this morning -2D echo ordered, if negative, will likely need TEE -she does have a port for chemotherapy, this will also need to be removed -will ask IR to remove if/when she needs nephrostomies -If nephrostomies are not indicated, then can ask general surgery to remove -Hemodynamics currently stable -Currently, she appears to be stable -Follow-up urine and blood cultures  Acute kidney injury on chronic kidney disease stage IIIa -Bilateral hydronephrosis with presence of double-J stents -Seen by urology with recommendations for  continued IV hydration through the weekend and decompression of bladder with Foley catheter -If she does not have any improvement of renal function, may need to have bilateral nephrostomies placed -Baseline creatinine 1.97 -Creatinine on admission noted to be 3.1 -patient had foley placed and had 2050cc urine output yesterday -creatinine 2.8 today -Since patient's renal function has not improved to the degree one would expect after foley placement to resolve obstructive uropathy, will repeat CT abd toda to evaluate hydronephrosis -if patient has persistent hydronephrosis, will likely need bilateral nephrostomies with IR  Stage IV cervical cancer -She is followed at [place] follow-up as an outpatient  Anemia of chronic disease -No signs of bleeding -Follow hemoglobin  Hypothyroidism -Continue on Synthroid  Chronic pain syndrome -Continued home regimen of oxycodone and OxyContin  Depression -Continue on Prozac   DVT prophylaxis: heparin injection 5,000 Units Start: 09/23/20 1430 TED hose Start: 09/23/20 1416 SCDs Start: 09/23/20 1416  Code Status: Full code Family Communication: Discussed with patient Disposition Plan: Status is: Inpatient  Remains inpatient appropriate because:IV treatments appropriate due to intensity of illness or inability to take PO and Inpatient level of care appropriate due to severity of illness   Dispo: The patient is from: Home              Anticipated d/c is to: Home              Patient currently is not medically stable to d/c.   Difficult to place patient No         Consultants:   Urology  Infectious Disease (phone)  Procedures:     Antimicrobials:  Ceftriaxone 6/2 >6/4  Ancef 6/4>   Subjective: She does have occasional headaches. Denies any neck pain. She is having occasional bilateral flank pain.  Objective: Vitals:   09/26/20 0747 09/26/20 0900 09/26/20 1100 09/26/20 1123  BP:      Pulse: 86 90  91 94  Resp: (!) 21 12 17 20   Temp: 99.6 F (37.6 C)   (!) 100.4 F (38 C)  TempSrc: Oral   Oral  SpO2: 99% 100% 93% 97%  Weight:        Intake/Output Summary (Last 24 hours) at 09/26/2020 1127 Last data filed at 09/26/2020 0400 Gross per 24 hour  Intake 2428.39 ml  Output 2050 ml  Net 378.39 ml   Filed Weights   09/23/20 1057 09/24/20 0513 09/25/20 0800  Weight: 84.8 kg 88.6 kg 90.9 kg    Examination:  General exam: Alert, awake, oriented x 3 Respiratory system: Clear to auscultation. Respiratory effort normal. Cardiovascular system:RRR. No murmurs, rubs, gallops. Gastrointestinal system: Abdomen is nondistended, soft and nontender. No organomegaly or masses felt. Normal bowel sounds heard. Central nervous system: Alert and oriented. No focal neurological deficits. Extremities: No C/C/E, +pedal pulses Skin: No rashes, lesions or ulcers Psychiatry: Judgement and insight appear normal. Mood & affect appropriate.     Data Reviewed: I have personally reviewed following labs and imaging studies  CBC: Recent Labs  Lab 09/23/20 1132 09/23/20 1609 09/24/20 0510 09/25/20 0521 09/26/20 0351  WBC 11.3* 11.2* 9.1 6.7 5.4  NEUTROABS  --  10.1*  --   --   --   HGB 12.4 11.0* 10.5* 10.0* 9.3*  HCT 37.6 34.2* 32.2* 30.8* 29.1*  MCV 97.4 97.4 97.3 96.3 96.7  PLT 171 155 143* 134* 382*   Basic Metabolic Panel: Recent Labs  Lab 09/23/20 1132 09/23/20 1609 09/24/20 0510 09/25/20 0521 09/26/20 0351  NA 132* 134* 132* 133* 134*  K 3.7 4.4 4.0 3.6 3.7  CL 96* 101 100 102 104  CO2 24 24 24 22 23   GLUCOSE 142* 134* 102* 100* 106*  BUN 38* 40* 39* 39* 33*  CREATININE 3.16* 3.00* 3.00* 2.96* 2.84*  CALCIUM 8.2* 7.6* 7.6* 7.2* 7.0*   GFR: CrCl cannot be calculated (Unknown ideal weight.). Liver Function Tests: Recent Labs  Lab 09/23/20 1609  AST 26  ALT 23  ALKPHOS 67  BILITOT 1.1  PROT 6.6  ALBUMIN 2.8*   No results for input(s): LIPASE, AMYLASE in the last 168  hours. No results for input(s): AMMONIA in the last 168 hours. Coagulation Profile: Recent Labs  Lab 09/23/20 1609 09/24/20 0510  INR 1.2 1.2   Cardiac Enzymes: No results for input(s): CKTOTAL, CKMB, CKMBINDEX, TROPONINI in the last 168 hours. BNP (last 3 results) No results for input(s): PROBNP in the last 8760 hours. HbA1C: No results for input(s): HGBA1C in the last 72 hours. CBG: Recent Labs  Lab 09/24/20 0811 09/25/20 0746  GLUCAP 99 97   Lipid Profile: No results for input(s): CHOL, HDL, LDLCALC, TRIG, CHOLHDL, LDLDIRECT in the last 72 hours. Thyroid Function Tests: No results for input(s): TSH, T4TOTAL, FREET4, T3FREE, THYROIDAB in the last 72 hours. Anemia Panel: No results for input(s): VITAMINB12, FOLATE, FERRITIN, TIBC, IRON, RETICCTPCT in the last 72 hours. Sepsis Labs: Recent Labs  Lab 09/23/20 1609 09/23/20 2226  LATICACIDVEN 1.1 1.0    Recent Results (from the past 240 hour(s))  Resp Panel by RT-PCR (Flu A&B, Covid) Nasopharyngeal Swab     Status: None   Collection Time: 09/23/20  11:23 AM   Specimen: Nasopharyngeal Swab; Nasopharyngeal(NP) swabs in vial transport medium  Result Value Ref Range Status   SARS Coronavirus 2 by RT PCR NEGATIVE NEGATIVE Final    Comment: (NOTE) SARS-CoV-2 target nucleic acids are NOT DETECTED.  The SARS-CoV-2 RNA is generally detectable in upper respiratory specimens during the acute phase of infection. The lowest concentration of SARS-CoV-2 viral copies this assay can detect is 138 copies/mL. A negative result does not preclude SARS-Cov-2 infection and should not be used as the sole basis for treatment or other patient management decisions. A negative result may occur with  improper specimen collection/handling, submission of specimen other than nasopharyngeal swab, presence of viral mutation(s) within the areas targeted by this assay, and inadequate number of viral copies(<138 copies/mL). A negative result must be  combined with clinical observations, patient history, and epidemiological information. The expected result is Negative.  Fact Sheet for Patients:  EntrepreneurPulse.com.au  Fact Sheet for Healthcare Providers:  IncredibleEmployment.be  This test is no t yet approved or cleared by the [place] FDA and  has been authorized for detection and/or diagnosis of SARS-CoV-2 by FDA under an Emergency Use Authorization (EUA). This EUA will remain  in effect (meaning this test can be used) for the duration of the COVID-19 declaration under Section 564(b)(1) of the Act, 21 U.S.C.section 360bbb-3(b)(1), unless the authorization is terminated  or revoked sooner.       Influenza A by PCR NEGATIVE NEGATIVE Final   Influenza B by PCR NEGATIVE NEGATIVE Final    Comment: (NOTE) The Xpert Xpress SARS-CoV-2/FLU/RSV plus assay is intended as an aid in the diagnosis of influenza from Nasopharyngeal swab specimens and should not be used as a sole basis for treatment. Nasal washings and aspirates are unacceptable for Xpert Xpress SARS-CoV-2/FLU/RSV testing.  Fact Sheet for Patients: EntrepreneurPulse.com.au  Fact Sheet for Healthcare Providers: IncredibleEmployment.be  This test is not yet approved or cleared by the [place] FDA and has been authorized for detection and/or diagnosis of SARS-CoV-2 by FDA under an Emergency Use Authorization (EUA). This EUA will remain in effect (meaning this test can be used) for the duration of the COVID-19 declaration under Section 564(b)(1) of the Act, 21 U.S.C. section 360bbb-3(b)(1), unless the authorization is terminated or revoked.  Performed at [facility], [address] [place], [place] [address]   Culture, blood (routine x 2)     Status: Abnormal   Collection Time: 09/23/20  4:09 PM   Specimen: BLOOD RIGHT FOREARM  Result Value Ref Range Status   Specimen Description    Final    BLOOD RIGHT FOREARM Performed at [facility], [address], [place], [place] [address]    Special Requests   Final    Blood Culture results may not be optimal due to an inadequate volume of blood received in culture bottles BOTTLES DRAWN AEROBIC AND ANAEROBIC Performed at [facility], [address], [place], [place] [address]    Culture  Setup Time   Final    GRAM POSITIVE COCCI BOTH AEROBIC AND ANAEROBIC BOTTLES Gram Stain Report Called to,Read Back By and Verified With: [facility] CAULDER  09/24/20 @ 1011 BY S BEARD CRITICAL RESULT CALLED TO, READ BACK BY AND VERIFIED WITH: Golden Pop RN 1700 09/24/20 A BROWNING Performed at [facility], [place] [address] [place], [place] [address]    Culture STAPHYLOCOCCUS AUREUS (A)  Final   Report Status 09/26/2020 FINAL  Final   Organism ID, Bacteria STAPHYLOCOCCUS AUREUS  Final  Susceptibility   Staphylococcus aureus - MIC*    CIPROFLOXACIN <=0.5 SENSITIVE Sensitive     ERYTHROMYCIN <=0.25 SENSITIVE Sensitive     GENTAMICIN <=0.5 SENSITIVE Sensitive     OXACILLIN <=0.25 SENSITIVE Sensitive     TETRACYCLINE <=1 SENSITIVE Sensitive     VANCOMYCIN 1 SENSITIVE Sensitive     TRIMETH/SULFA <=10 SENSITIVE Sensitive     CLINDAMYCIN <=0.25 SENSITIVE Sensitive     RIFAMPIN <=0.5 SENSITIVE Sensitive     Inducible Clindamycin NEGATIVE Sensitive     * STAPHYLOCOCCUS AUREUS  Culture, blood (routine x 2)     Status: None (Preliminary result)   Collection Time: 09/23/20  4:09 PM   Specimen: BLOOD  Result Value Ref Range Status   Specimen Description BLOOD LEFT ANTECUBITAL  Final   Special Requests   Final    Blood Culture adequate volume BOTTLES DRAWN AEROBIC AND ANAEROBIC   Culture   Final    NO GROWTH 3 DAYS Performed at [facility], [address], [place], [place] [address]    Report Status PENDING  Incomplete  Blood Culture ID Panel (Reflexed)     Status: Abnormal   Collection Time: 09/23/20  4:09 PM  Result Value Ref Range Status    Enterococcus faecalis NOT DETECTED NOT DETECTED Final   Enterococcus Faecium NOT DETECTED NOT DETECTED Final   Listeria monocytogenes NOT DETECTED NOT DETECTED Final   Staphylococcus species DETECTED (A) NOT DETECTED Final    Comment: CRITICAL RESULT CALLED TO, READ BACK BY AND VERIFIED WITH: Golden Pop RN 1700 09/24/20 A BROWNING    Staphylococcus aureus (BCID) DETECTED (A) NOT DETECTED Final    Comment: CRITICAL RESULT CALLED TO, READ BACK BY AND VERIFIED WITH: Golden Pop RN 1700 09/24/20 A BROWNING    Staphylococcus epidermidis NOT DETECTED NOT DETECTED Final   Staphylococcus lugdunensis NOT DETECTED NOT DETECTED Final   Streptococcus species NOT DETECTED NOT DETECTED Final   Streptococcus agalactiae NOT DETECTED NOT DETECTED Final   Streptococcus pneumoniae NOT DETECTED NOT DETECTED Final   Streptococcus pyogenes NOT DETECTED NOT DETECTED Final   A.calcoaceticus-baumannii NOT DETECTED NOT DETECTED Final   Bacteroides fragilis NOT DETECTED NOT DETECTED Final   Enterobacterales NOT DETECTED NOT DETECTED Final   Enterobacter cloacae complex NOT DETECTED NOT DETECTED Final   Escherichia coli NOT DETECTED NOT DETECTED Final   Klebsiella aerogenes NOT DETECTED NOT DETECTED Final   Klebsiella oxytoca NOT DETECTED NOT DETECTED Final   Klebsiella pneumoniae NOT DETECTED NOT DETECTED Final   Proteus species NOT DETECTED NOT DETECTED Final   Salmonella species NOT DETECTED NOT DETECTED Final   Serratia marcescens NOT DETECTED NOT DETECTED Final   Haemophilus influenzae NOT DETECTED NOT DETECTED Final   Neisseria meningitidis NOT DETECTED NOT DETECTED Final   Pseudomonas aeruginosa NOT DETECTED NOT DETECTED Final   Stenotrophomonas maltophilia NOT DETECTED NOT DETECTED Final   Candida albicans NOT DETECTED NOT DETECTED Final   Candida auris NOT DETECTED NOT DETECTED Final   Candida glabrata NOT DETECTED NOT DETECTED Final   Candida krusei NOT DETECTED NOT DETECTED Final   Candida parapsilosis NOT  DETECTED NOT DETECTED Final   Candida tropicalis NOT DETECTED NOT DETECTED Final   Cryptococcus neoformans/gattii NOT DETECTED NOT DETECTED Final   Meth resistant mecA/C and MREJ NOT DETECTED NOT DETECTED Final    Comment: Performed at [facility], [address], [place], [place] [address]  MRSA PCR Screening     Status: None   Collection Time: 09/23/20  5:05  PM   Specimen: Nasopharyngeal  Result Value Ref Range Status   MRSA by PCR NEGATIVE NEGATIVE Final    Comment:        The GeneXpert MRSA Assay (FDA approved for NASAL specimens only), is one component of a comprehensive MRSA colonization surveillance program. It is not intended to diagnose MRSA infection nor to guide or monitor treatment for MRSA infections. Performed at [facility], [address], [place], [place] [address]   Culture, blood (routine x 2)     Status: None (Preliminary result)   Collection Time: 09/26/20  3:51 AM   Specimen: BLOOD RIGHT HAND  Result Value Ref Range Status   Specimen Description BLOOD RIGHT HAND  Final   Special Requests   Final    BOTTLES DRAWN AEROBIC AND ANAEROBIC Blood Culture adequate volume   Culture   Final    NO GROWTH < 12 HOURS Performed at [facility], [address] [place], [place] [address]    Report Status PENDING  Incomplete  Culture, blood (routine x 2)     Status: None (Preliminary result)   Collection Time: 09/26/20  3:58 AM   Specimen: BLOOD LEFT HAND  Result Value Ref Range Status   Specimen Description BLOOD LEFT HAND  Final   Special Requests   Final    BOTTLES DRAWN AEROBIC AND ANAEROBIC Blood Culture adequate volume   Culture   Final    NO GROWTH < 12 HOURS Performed at [facility], [address], [place], [place] [address]    Report Status PENDING  Incomplete         Radiology Studies: ECHOCARDIOGRAM COMPLETE  Result Date: 09/26/2020    ECHOCARDIOGRAM REPORT   Patient Name:   Judeen Hammans Fillion Date of Exam: 09/26/2020 Medical Rec #:  [id number]     Height:       69.0 in Accession #:    [id number]   Weight:       200.4 lb Date of Birth:  1965/09/30    BSA:          2.068 m Patient Age:    30 years     BP:           128/76 mmHg Patient Gender: F            HR:           89 bpm. Exam Location:  [facility] Procedure: 2D Echo, Cardiac Doppler and Color Doppler Indications:    Bacteremia R78.81  History:        Patient has prior history of Echocardiogram examinations, most                 recent 04/17/2017. Risk Factors:Hypertension. CKD (chronic                 kidney disease) Thyroid mass of unclear                 etiology. Uterine cervix cancer Patient.  Sonographer:    Alvino Chapel RCS Referring Phys: [place]  1. Left ventricular ejection fraction, by estimation, is 55 to 60%. The left ventricle has normal function. The left ventricle has no regional wall motion abnormalities. There is mild left ventricular hypertrophy. Left ventricular diastolic parameters were normal.  2. Right ventricular systolic function is normal. The right ventricular size is normal. There is normal pulmonary artery systolic pressure. The estimated right ventricular systolic pressure is 55.7 mmHg.  3. Left atrial size was mildly dilated.  4. There is a trivial pericardial effusion posterior to the left ventricle.  5. The mitral valve is grossly normal. Mild mitral valve regurgitation.  6. The aortic valve is tricuspid. Aortic valve regurgitation is not visualized.  7. The inferior vena cava is dilated in size with >50% respiratory variability, suggesting right atrial pressure of 8 mmHg.  8. No obvious valvular vegetations. FINDINGS  Left Ventricle: Left ventricular ejection fraction, by estimation, is 55 to 60%. The left ventricle has normal function. The left ventricle has no regional wall motion abnormalities. The left ventricular internal cavity size was normal in size. There is  mild left ventricular hypertrophy. Left ventricular diastolic parameters were  normal. Right Ventricle: The right ventricular size is normal. No increase in right ventricular wall thickness. Right ventricular systolic function is normal. There is normal pulmonary artery systolic pressure. The tricuspid regurgitant velocity is 2.53 m/s, and  with an assumed right atrial pressure of 8 mmHg, the estimated right ventricular systolic pressure is 84.1 mmHg. Left Atrium: Left atrial size was mildly dilated. Right Atrium: Right atrial size was normal in size. Pericardium: Trivial pericardial effusion is present. The pericardial effusion is posterior to the left ventricle. Mitral Valve: The mitral valve is grossly normal. There is mild thickening of the mitral valve leaflet(s). Mild mitral valve regurgitation. Tricuspid Valve: The tricuspid valve is grossly normal. Tricuspid valve regurgitation is trivial. Aortic Valve: The aortic valve is tricuspid. Aortic valve regurgitation is not visualized. Pulmonic Valve: The pulmonic valve was grossly normal. Pulmonic valve regurgitation is trivial. Aorta: The aortic root is normal in size and structure. Venous: The inferior vena cava is dilated in size with greater than 50% respiratory variability, suggesting right atrial pressure of 8 mmHg. IAS/Shunts: No atrial level shunt detected by color flow Doppler.  LEFT VENTRICLE PLAX 2D LVIDd:         4.81 cm  Diastology LVIDs:         3.33 cm  LV e' medial:    8.36 cm/s LV PW:         1.20 cm  LV E/e' medial:  11.6 LV IVS:        1.20 cm  LV e' lateral:   8.55 cm/s LVOT diam:     2.00 cm  LV E/e' lateral: 11.3 LV SV:         80 LV SV Index:   39 LVOT Area:     3.14 cm  RIGHT VENTRICLE RV S prime:     16.90 cm/s TAPSE (M-mode): 2.3 cm LEFT ATRIUM             Index       RIGHT ATRIUM           Index LA diam:        4.00 cm 1.93 cm/m  RA Area:     18.10 cm LA Vol (A2C):   71.1 ml 34.39 ml/m RA Volume:   43.10 ml  20.84 ml/m LA Vol (A4C):   83.7 ml 40.48 ml/m LA Biplane Vol: 78.4 ml 37.92 ml/m  AORTIC VALVE LVOT  Vmax:   116.00 cm/s LVOT Vmean:  77.100 cm/s LVOT VTI:    0.254 m  AORTA Ao Root diam: 3.70 cm MITRAL VALVE                TRICUSPID VALVE MV Area (PHT): 3.30 cm     TR Peak grad:   25.6 mmHg MV Decel Time: 230 msec     TR Vmax:  253.00 cm/s MV E velocity: 96.80 cm/s MV A velocity: 113.00 cm/s  SHUNTS MV E/A ratio:  0.86         Systemic VTI:  0.25 m                             Systemic Diam: 2.00 cm Rozann Lesches MD Electronically signed by Rozann Lesches MD Signature Date/Time: 09/26/2020/11:16:12 AM    Final         Scheduled Meds: . calcium acetate  1,334 mg Oral TID WC  . Chlorhexidine Gluconate Cloth  6 each Topical Q0600  . FLUoxetine  10 mg Oral Daily  . heparin  5,000 Units Subcutaneous Q8H  . levothyroxine  175 mcg Oral Daily  . oxyCODONE  10 mg Oral Q12H  . polyethylene glycol  17 g Oral Daily  . senna  1 tablet Oral QHS  . sodium chloride flush  3 mL Intravenous Q12H  . sodium chloride flush  3 mL Intravenous Q12H  . temazepam  15 mg Oral Once   Continuous Infusions: . sodium chloride 100 mL/hr at 09/25/20 1413  . sodium chloride    .  ceFAZolin (ANCEF) IV 2 g (09/26/20 0612)     LOS: 3 days    Time spent: 28mins    Kathie Dike, MD [facility]   If 7PM-7AM, please contact night-coverage [email or web address]  09/26/2020, 11:27 AM   Addendum 18:00  Repeat CT abdomen from earlier today reviewed.  Shows persistent, unchanged hydronephrosis.  With her persistently elevated creatinine, continued fevers and concern for ongoing infection, will place consult for interventional radiology to review for bilateral percutaneous nephrostomies.  We will also request that interventional radiology evaluate patient for removal of Port-A-Cath in the setting of bacteremia  [facility]

## 2020-09-26 NOTE — Progress Notes (Signed)
*  PRELIMINARY RESULTS* Echocardiogram 2D Echocardiogram has been performed.  Tanya Wright 09/26/2020, 9:40 AM

## 2020-09-26 NOTE — Progress Notes (Signed)
Pharmacy Antibiotic Note  Tanya Wright is a 55 y.o. female admitted on 09/23/2020 with MSSA bacteremia.  Pharmacy has been consulted for Cefazolin dosing.  Plan: Cefazolin 2000 mg IV every 12 hours. Monitor labs, c/s, and patient improvement.  Weight: 90.9 kg (200 lb 6.4 oz)  Temp (24hrs), Avg:100.6 F (38.1 C), Min:98.4 F (36.9 C), Max:103.2 F (39.6 C)  Recent Labs  Lab 09/23/20 1132 09/23/20 1609 09/23/20 2226 09/24/20 0510 09/25/20 0521 09/26/20 0351  WBC 11.3* 11.2*  --  9.1 6.7 5.4  CREATININE 3.16* 3.00*  --  3.00* 2.96* 2.84*  LATICACIDVEN  --  1.1 1.0  --   --   --     CrCl cannot be calculated (Unknown ideal weight.).    Allergies  Allergen Reactions  . Oxycodone Itching    Antimicrobials this admission: CTX 6/2 >>6/4 Cefazolin 6/4 >>   Microbiology results: 6/5 Bcx: pending 6/2 Bcx: staph 2/4 bottles BCID: MSSA 6/2 Ucx: pending MRSA PCR neg  Thank you for allowing pharmacy to be a part of this patient's care.  Ramond Craver 09/26/2020 8:47 AM

## 2020-09-27 DIAGNOSIS — R7881 Bacteremia: Secondary | ICD-10-CM

## 2020-09-27 DIAGNOSIS — C539 Malignant neoplasm of cervix uteri, unspecified: Secondary | ICD-10-CM

## 2020-09-27 DIAGNOSIS — B9561 Methicillin susceptible Staphylococcus aureus infection as the cause of diseases classified elsewhere: Secondary | ICD-10-CM

## 2020-09-27 DIAGNOSIS — N1330 Unspecified hydronephrosis: Secondary | ICD-10-CM

## 2020-09-27 DIAGNOSIS — A4101 Sepsis due to Methicillin susceptible Staphylococcus aureus: Principal | ICD-10-CM

## 2020-09-27 LAB — BASIC METABOLIC PANEL
Anion gap: 7 (ref 5–15)
BUN: 28 mg/dL — ABNORMAL HIGH (ref 6–20)
CO2: 24 mmol/L (ref 22–32)
Calcium: 7.5 mg/dL — ABNORMAL LOW (ref 8.9–10.3)
Chloride: 108 mmol/L (ref 98–111)
Creatinine, Ser: 2.85 mg/dL — ABNORMAL HIGH (ref 0.44–1.00)
GFR, Estimated: 19 mL/min — ABNORMAL LOW (ref >60)
Glucose, Bld: 110 mg/dL — ABNORMAL HIGH (ref 70–99)
Potassium: 4.2 mmol/L (ref 3.5–5.1)
Sodium: 139 mmol/L (ref 135–145)

## 2020-09-27 LAB — CBC
HCT: 29.6 % — ABNORMAL LOW (ref 36.0–46.0)
Hemoglobin: 9.4 g/dL — ABNORMAL LOW (ref 12.0–15.0)
MCH: 31.4 pg (ref 26.0–34.0)
MCHC: 31.8 g/dL (ref 30.0–36.0)
MCV: 99 fL (ref 80.0–100.0)
Platelets: 185 10*3/uL (ref 150–400)
RBC: 2.99 MIL/uL — ABNORMAL LOW (ref 3.87–5.11)
RDW: 15.3 % (ref 11.5–15.5)
WBC: 4.8 10*3/uL (ref 4.0–10.5)
nRBC: 0 % (ref 0.0–0.2)

## 2020-09-27 LAB — GLUCOSE, CAPILLARY: Glucose-Capillary: 124 mg/dL — ABNORMAL HIGH (ref 70–99)

## 2020-09-27 NOTE — Progress Notes (Signed)
PROGRESS NOTE    Tanya Wright  IRJ:[id number] DOB: 1966-04-07 DOA: 09/23/2020 PCP: System, Provider Not In    Brief Narrative:  55 year old female with a history of cervical cancer status post radiation therapy and bilateral distal ureteral obstruction managed with bilateral ureteral stents.  She is followed at [facility] urology and oncology.  Last stent exchange was 05/2020.  She is admitted to the hospital with sepsis secondary to urinary source.  She is also noted to have acute kidney injury and bilateral hydronephrosis.  Urology following.  Foley catheter placed for bladder decompression.  May need to consider percutaneous nephrostomies if renal function does not improve   Assessment & Plan:   Principal Problem:   UTI (urinary tract infection) Active Problems:   Uterine cervix cancer (HCC)   CKD (chronic kidney disease) stage 3, GFR 30-59 ml/min (HCC)   Anemia in other chronic diseases classified elsewhere   AKI (acute kidney injury) ([place])   Sepsis ([place])   MSSA bacteremia   Bilateral hydronephrosis   Sepsis due to MSSA bacteremia and urinary tract infection -Patient noted to be febrile, tachycardic, with source of infection being urine and evidence of endorgan damage with acute kidney injury -Initially on Rocephin -She did have 1 out of 2 blood cultures positive for MSSA - Discussed with Dr. Juleen China and antibiotics changed to Ancef -repeat cultures sent 09/26/2020; no growth so far. -2D echo ordered, and demonstrated no vegetations.  Cardiology service has been consulted for TEE. -she does have a port for chemotherapy, pending removal by IR. -Hemodynamics currently stable -Currently, she appears to be stable and expressed feeling slightly better. -No fever. -Follow-up urine and blood cultures  Acute kidney injury on chronic kidney disease stage IIIa -Bilateral hydronephrosis with presence of double-J stents -Seen by urology with recommendations for continued IV hydration through the  weekend and decompression of bladder with Foley catheter. -IR has been contacted for assistance with Port-A-Cath removal and bilateral nephrostomy tubes. -Continue to follow any further urology service recommendations. -Baseline creatinine around 1.97 -Creatinine on admission noted to be 3.1 -creatinine remains stable at 2.8 currently. Some Increased in urine output appreciated.   -Repeat CT scan demonstrated persistent hydronephrosis and IR has been consulted for nephrostomy tubes.  Stage IV cervical cancer -She is followed at [facility]-up as an outpatient  Anemia of chronic disease -No signs of bleeding -Follow hemoglobin trend.  Hypothyroidism -Continue home dose Synthroid  Chronic pain syndrome -Continued home regimen of oxycodone and OxyContin  Depression -Continue on Prozac   DVT prophylaxis: heparin injection 5,000 Units Start: 09/23/20 1430 TED hose Start: 09/23/20 1416 SCDs Start: 09/23/20 1416  Code Status: Full code Family Communication: Discussed with patient Disposition Plan: Status is: Inpatient  Remains inpatient appropriate because:IV treatments appropriate due to intensity of illness or inability to take PO and Inpatient level of care appropriate due to severity of illness   Dispo: The patient is from: Home              Anticipated d/c is to: Home              Patient currently is not medically stable to d/c.   Difficult to place patient no    Consultants:   Urology  Infectious Disease (phone)  Cardiology service  IR  Procedures:     Antimicrobials:   Ceftriaxone 6/2 >6/4  Ancef 6/4>   Subjective: Patient reports intermittent bilateral flank pain; no nausea, no vomiting, no chest pain.  Currently afebrile.  Objective: Vitals:   09/27/20 0448 09/27/20 0500 09/27/20 1344 09/27/20 1413  BP: 104/79  (!) 121/94   Pulse: 98  86   Resp: 16  18   Temp: 98.6 F (37 C)   99 F (37.2 C)  TempSrc: Oral   Oral   SpO2: 98%  99%   Weight:  91 kg    Height:        Intake/Output Summary (Last 24 hours) at 09/27/2020 1834 Last data filed at 09/27/2020 1817 Gross per 24 hour  Intake 935.95 ml  Output 3250 ml  Net -2314.05 ml   Filed Weights   09/25/20 0800 09/26/20 1313 09/27/20 0500  Weight: 90.9 kg 91.5 kg 91 kg    Examination: General exam: Alert, awake, oriented x 3; currently afebrile reporting increasing her urine output.  No nausea, no vomiting. Respiratory system: Good air movement bilaterally, no wheezing, no crackles. Cardiovascular system:RRR. No murmurs, rubs, gallops.  No JVD appreciated on exam. Gastrointestinal system: Abdomen is nondistended, soft and nontender. No organomegaly or masses felt. Normal bowel sounds heard. Central nervous system: Alert and oriented. No focal neurological deficits. Extremities: No cyanosis or clubbing. Skin: No petechiae. Psychiatry: Judgement and insight appear normal. Mood & affect appropriate.    Data Reviewed: I have personally reviewed following labs and imaging studies  CBC: Recent Labs  Lab 09/23/20 1609 09/24/20 0510 09/25/20 0521 09/26/20 0351 09/27/20 0439  WBC 11.2* 9.1 6.7 5.4 4.8  NEUTROABS 10.1*  --   --   --   --   HGB 11.0* 10.5* 10.0* 9.3* 9.4*  HCT 34.2* 32.2* 30.8* 29.1* 29.6*  MCV 97.4 97.3 96.3 96.7 99.0  PLT 155 143* 134* 133* 528   Basic Metabolic Panel: Recent Labs  Lab 09/23/20 1609 09/24/20 0510 09/25/20 0521 09/26/20 0351 09/27/20 0439  NA 134* 132* 133* 134* 139  K 4.4 4.0 3.6 3.7 4.2  CL 101 100 102 104 108  CO2 24 24 22 23 24   GLUCOSE 134* 102* 100* 106* 110*  BUN 40* 39* 39* 33* 28*  CREATININE 3.00* 3.00* 2.96* 2.84* 2.85*  CALCIUM 7.6* 7.6* 7.2* 7.0* 7.5*   GFR: Estimated Creatinine Clearance: 27.5 mL/min (A) (by C-G formula based on SCr of 2.85 mg/dL (H)).   Liver Function Tests: Recent Labs  Lab 09/23/20 1609  AST 26  ALT 23  ALKPHOS 67  BILITOT 1.1  PROT 6.6  ALBUMIN 2.8*    Coagulation Profile: Recent Labs  Lab 09/23/20 1609 09/24/20 0510  INR 1.2 1.2   CBG: Recent Labs  Lab 09/24/20 0811 09/25/20 0746 09/27/20 0723  GLUCAP 99 97 124*   Sepsis Labs: Recent Labs  Lab 09/23/20 1609 09/23/20 2226  LATICACIDVEN 1.1 1.0    Recent Results (from the past 240 hour(s))  Resp Panel by RT-PCR (Flu A&B, Covid) Nasopharyngeal Swab     Status: None   Collection Time: 09/23/20 11:23 AM   Specimen: Nasopharyngeal Swab; Nasopharyngeal(NP) swabs in vial transport medium  Result Value Ref Range Status   SARS Coronavirus 2 by RT PCR NEGATIVE NEGATIVE Final    Comment: (NOTE) SARS-CoV-2 target nucleic acids are NOT DETECTED.  The SARS-CoV-2 RNA is generally detectable in upper respiratory specimens during the acute phase of infection. The lowest concentration of SARS-CoV-2 viral copies this assay can detect is 138 copies/mL. A negative result does not preclude SARS-Cov-2 infection and should not be used as the sole basis for treatment or other patient management decisions. A negative result may occur with  improper specimen collection/handling, submission of specimen other than nasopharyngeal swab, presence of viral mutation(s) within the areas targeted by this assay, and inadequate number of viral copies(<138 copies/mL). A negative result must be combined with clinical observations, patient history, and epidemiological information. The expected result is Negative.  Fact Sheet for Patients:  EntrepreneurPulse.com.au  Fact Sheet for Healthcare Providers:  IncredibleEmployment.be  This test is no t yet approved or cleared by the [place] FDA and  has been authorized for detection and/or diagnosis of SARS-CoV-2 by FDA under an Emergency Use Authorization (EUA). This EUA will remain  in effect (meaning this test can be used) for the duration of the COVID-19 declaration under Section 564(b)(1) of the Act,  21 U.S.C.section 360bbb-3(b)(1), unless the authorization is terminated  or revoked sooner.       Influenza A by PCR NEGATIVE NEGATIVE Final   Influenza B by PCR NEGATIVE NEGATIVE Final    Comment: (NOTE) The Xpert Xpress SARS-CoV-2/FLU/RSV plus assay is intended as an aid in the diagnosis of influenza from Nasopharyngeal swab specimens and should not be used as a sole basis for treatment. Nasal washings and aspirates are unacceptable for Xpert Xpress SARS-CoV-2/FLU/RSV testing.  Fact Sheet for Patients: EntrepreneurPulse.com.au  Fact Sheet for Healthcare Providers: IncredibleEmployment.be  This test is not yet approved or cleared by the [place] FDA and has been authorized for detection and/or diagnosis of SARS-CoV-2 by FDA under an Emergency Use Authorization (EUA). This EUA will remain in effect (meaning this test can be used) for the duration of the COVID-19 declaration under Section 564(b)(1) of the Act, 21 U.S.C. section 360bbb-3(b)(1), unless the authorization is terminated or revoked.  Performed at [facility], [address], [place], [place] [address]   Culture, blood (routine x 2)     Status: Abnormal   Collection Time: 09/23/20  4:09 PM   Specimen: BLOOD RIGHT FOREARM  Result Value Ref Range Status   Specimen Description   Final    BLOOD RIGHT FOREARM Performed at [facility], [address], [place], [place] [address]    Special Requests   Final    Blood Culture results may not be optimal due to an inadequate volume of blood received in culture bottles BOTTLES DRAWN AEROBIC AND ANAEROBIC Performed at [facility], [address] [place], [place] [address]    Culture  Setup Time   Final    GRAM POSITIVE COCCI BOTH AEROBIC AND ANAEROBIC BOTTLES Gram Stain Report Called to,Read Back By and Verified With: [facility] CAULDER  09/24/20 @ 1011 BY S BEARD CRITICAL RESULT CALLED TO, READ BACK BY AND VERIFIED WITH: Golden Pop RN 1700  09/24/20 A BROWNING Performed at [facility], [place] [address], [place], [place] [address]    Culture STAPHYLOCOCCUS AUREUS (A)  Final   Report Status 09/26/2020 FINAL  Final   Organism ID, Bacteria STAPHYLOCOCCUS AUREUS  Final      Susceptibility   Staphylococcus aureus - MIC*    CIPROFLOXACIN <=0.5 SENSITIVE Sensitive     ERYTHROMYCIN <=0.25 SENSITIVE Sensitive     GENTAMICIN <=0.5 SENSITIVE Sensitive     OXACILLIN <=0.25 SENSITIVE Sensitive     TETRACYCLINE <=1 SENSITIVE Sensitive     VANCOMYCIN 1 SENSITIVE Sensitive     TRIMETH/SULFA <=10 SENSITIVE Sensitive     CLINDAMYCIN <=0.25 SENSITIVE Sensitive     RIFAMPIN <=0.5 SENSITIVE Sensitive     Inducible Clindamycin NEGATIVE Sensitive     * STAPHYLOCOCCUS AUREUS  Culture, blood (routine x 2)  Status: None (Preliminary result)   Collection Time: 09/23/20  4:09 PM   Specimen: BLOOD  Result Value Ref Range Status   Specimen Description BLOOD LEFT ANTECUBITAL  Final   Special Requests   Final    Blood Culture adequate volume BOTTLES DRAWN AEROBIC AND ANAEROBIC   Culture   Final    NO GROWTH 4 DAYS Performed at [facility], [address] [place], [place] [address]    Report Status PENDING  Incomplete  Blood Culture ID Panel (Reflexed)     Status: Abnormal   Collection Time: 09/23/20  4:09 PM  Result Value Ref Range Status   Enterococcus faecalis NOT DETECTED NOT DETECTED Final   Enterococcus Faecium NOT DETECTED NOT DETECTED Final   Listeria monocytogenes NOT DETECTED NOT DETECTED Final   Staphylococcus species DETECTED (A) NOT DETECTED Final    Comment: CRITICAL RESULT CALLED TO, READ BACK BY AND VERIFIED WITH: Golden Pop RN 1700 09/24/20 A BROWNING    Staphylococcus aureus (BCID) DETECTED (A) NOT DETECTED Final    Comment: CRITICAL RESULT CALLED TO, READ BACK BY AND VERIFIED WITH: Golden Pop RN 1700 09/24/20 A BROWNING    Staphylococcus epidermidis NOT DETECTED NOT DETECTED Final   Staphylococcus lugdunensis NOT DETECTED  NOT DETECTED Final   Streptococcus species NOT DETECTED NOT DETECTED Final   Streptococcus agalactiae NOT DETECTED NOT DETECTED Final   Streptococcus pneumoniae NOT DETECTED NOT DETECTED Final   Streptococcus pyogenes NOT DETECTED NOT DETECTED Final   A.calcoaceticus-baumannii NOT DETECTED NOT DETECTED Final   Bacteroides fragilis NOT DETECTED NOT DETECTED Final   Enterobacterales NOT DETECTED NOT DETECTED Final   Enterobacter cloacae complex NOT DETECTED NOT DETECTED Final   Escherichia coli NOT DETECTED NOT DETECTED Final   Klebsiella aerogenes NOT DETECTED NOT DETECTED Final   Klebsiella oxytoca NOT DETECTED NOT DETECTED Final   Klebsiella pneumoniae NOT DETECTED NOT DETECTED Final   Proteus species NOT DETECTED NOT DETECTED Final   Salmonella species NOT DETECTED NOT DETECTED Final   Serratia marcescens NOT DETECTED NOT DETECTED Final   Haemophilus influenzae NOT DETECTED NOT DETECTED Final   Neisseria meningitidis NOT DETECTED NOT DETECTED Final   Pseudomonas aeruginosa NOT DETECTED NOT DETECTED Final   Stenotrophomonas maltophilia NOT DETECTED NOT DETECTED Final   Candida albicans NOT DETECTED NOT DETECTED Final   Candida auris NOT DETECTED NOT DETECTED Final   Candida glabrata NOT DETECTED NOT DETECTED Final   Candida krusei NOT DETECTED NOT DETECTED Final   Candida parapsilosis NOT DETECTED NOT DETECTED Final   Candida tropicalis NOT DETECTED NOT DETECTED Final   Cryptococcus neoformans/gattii NOT DETECTED NOT DETECTED Final   Meth resistant mecA/C and MREJ NOT DETECTED NOT DETECTED Final    Comment: Performed at [facility], [address] [place], [place] [address]  MRSA PCR Screening     Status: None   Collection Time: 09/23/20  5:05 PM   Specimen: Nasopharyngeal  Result Value Ref Range Status   MRSA by PCR NEGATIVE NEGATIVE Final    Comment:        The GeneXpert MRSA Assay (FDA approved for NASAL specimens only), is one component of a comprehensive MRSA  colonization surveillance program. It is not intended to diagnose MRSA infection nor to guide or monitor treatment for MRSA infections. Performed at [facility], [address] [place], [place] [address]   Urine culture     Status: None   Collection Time: 09/25/20  9:29 AM   Specimen: Urine, Clean Catch  Result Value Ref Range Status   Specimen Description   Final    URINE, CLEAN CATCH Performed at [facility], [address] [place], [place] [address]    Special Requests   Final    NONE Performed at [facility], [address], [place], [place] [address]    Culture   Final    NO GROWTH Performed at [facility], [place] [address] [place], [place] [address]    Report Status 09/26/2020 FINAL  Final  Culture, blood (routine x 2)     Status: None (Preliminary result)   Collection Time: 09/26/20  3:51 AM   Specimen: BLOOD RIGHT HAND  Result Value Ref Range Status   Specimen Description BLOOD RIGHT HAND  Final   Special Requests   Final    BOTTLES DRAWN AEROBIC AND ANAEROBIC Blood Culture adequate volume   Culture   Final    NO GROWTH 1 DAY Performed at [facility], [address] [place], [place] [address]    Report Status PENDING  Incomplete  Culture, blood (routine x 2)     Status: None (Preliminary result)   Collection Time: 09/26/20  3:58 AM   Specimen: BLOOD LEFT HAND  Result Value Ref Range Status   Specimen Description BLOOD LEFT HAND  Final   Special Requests   Final    BOTTLES DRAWN AEROBIC AND ANAEROBIC Blood Culture adequate volume   Culture   Final    NO GROWTH 1 DAY Performed at [facility], [address], [place],  [address]    Report Status PENDING  Incomplete     Radiology Studies: ECHOCARDIOGRAM COMPLETE  Result Date: 09/26/2020    ECHOCARDIOGRAM REPORT   Patient Name:   Judeen Hammans Vallee Date of Exam: 09/26/2020 Medical Rec #:  [id number]    Height:       69.0 in Accession #:    [id number]   Weight:       200.4 lb Date of Birth:  1965/11/23     BSA:          2.068 m Patient Age:    21 years     BP:           128/76 mmHg Patient Gender: F            HR:           89 bpm. Exam Location:  [facility] Procedure: 2D Echo, Cardiac Doppler and Color Doppler Indications:    Bacteremia R78.81  History:        Patient has prior history of Echocardiogram examinations, most                 recent 04/17/2017. Risk Factors:Hypertension. CKD (chronic                 kidney disease) Thyroid mass of unclear                 etiology. Uterine cervix cancer Patient.  Sonographer:    Alvino Chapel RCS Referring Phys: Conce[place]  1. Left ventricular ejection fraction, by estimation, is 55 to 60%. The left ventricle has normal function. The left ventricle has no regional wall motion abnormalities. There is mild left ventricular hypertrophy. Left ventricular diastolic parameters were normal.  2. Right ventricular systolic function is normal. The right ventricular size is normal. There is normal pulmonary artery systolic pressure. The estimated right ventricular systolic pressure is 99.8 mmHg.  3. Left atrial size was mildly  dilated.  4. There is a trivial pericardial effusion posterior to the left ventricle.  5. The mitral valve is grossly normal. Mild mitral valve regurgitation.  6. The aortic valve is tricuspid. Aortic valve regurgitation is not visualized.  7. The inferior vena cava is dilated in size with >50% respiratory variability, suggesting right atrial pressure of 8 mmHg.  8. No obvious valvular vegetations. FINDINGS  Left Ventricle: Left ventricular ejection fraction, by estimation, is 55 to 60%. The left ventricle has normal function. The left ventricle has no regional wall motion abnormalities. The left ventricular internal cavity size was normal in size. There is  mild left ventricular hypertrophy. Left ventricular diastolic parameters were normal. Right Ventricle: The right ventricular size is normal. No increase in right ventricular wall  thickness. Right ventricular systolic function is normal. There is normal pulmonary artery systolic pressure. The tricuspid regurgitant velocity is 2.53 m/s, and  with an assumed right atrial pressure of 8 mmHg, the estimated right ventricular systolic pressure is 99.2 mmHg. Left Atrium: Left atrial size was mildly dilated. Right Atrium: Right atrial size was normal in size. Pericardium: Trivial pericardial effusion is present. The pericardial effusion is posterior to the left ventricle. Mitral Valve: The mitral valve is grossly normal. There is mild thickening of the mitral valve leaflet(s). Mild mitral valve regurgitation. Tricuspid Valve: The tricuspid valve is grossly normal. Tricuspid valve regurgitation is trivial. Aortic Valve: The aortic valve is tricuspid. Aortic valve regurgitation is not visualized. Pulmonic Valve: The pulmonic valve was grossly normal. Pulmonic valve regurgitation is trivial. Aorta: The aortic root is normal in size and structure. Venous: The inferior vena cava is dilated in size with greater than 50% respiratory variability, suggesting right atrial pressure of 8 mmHg. IAS/Shunts: No atrial level shunt detected by color flow Doppler.  LEFT VENTRICLE PLAX 2D LVIDd:         4.81 cm  Diastology LVIDs:         3.33 cm  LV e' medial:    8.36 cm/s LV PW:         1.20 cm  LV E/e' medial:  11.6 LV IVS:        1.20 cm  LV e' lateral:   8.55 cm/s LVOT diam:     2.00 cm  LV E/e' lateral: 11.3 LV SV:         80 LV SV Index:   39 LVOT Area:     3.14 cm  RIGHT VENTRICLE RV S prime:     16.90 cm/s TAPSE (M-mode): 2.3 cm LEFT ATRIUM             Index       RIGHT ATRIUM           Index LA diam:        4.00 cm 1.93 cm/m  RA Area:     18.10 cm LA Vol (A2C):   71.1 ml 34.39 ml/m RA Volume:   43.10 ml  20.84 ml/m LA Vol (A4C):   83.7 ml 40.48 ml/m LA Biplane Vol: 78.4 ml 37.92 ml/m  AORTIC VALVE LVOT Vmax:   116.00 cm/s LVOT Vmean:  77.100 cm/s LVOT VTI:    0.254 m  AORTA Ao Root diam: 3.70 cm MITRAL  VALVE                TRICUSPID VALVE MV Area (PHT): 3.30 cm     TR Peak grad:   25.6 mmHg MV Decel Time: 230 msec  TR Vmax:        253.00 cm/s MV E velocity: 96.80 cm/s MV A velocity: 113.00 cm/s  SHUNTS MV E/A ratio:  0.86         Systemic VTI:  0.25 m                             Systemic Diam: 2.00 cm Rozann Lesches MD Electronically signed by Rozann Lesches MD Signature Date/Time: 09/26/2020/11:16:12 AM    Final    CT RENAL STONE STUDY  Result Date: 09/26/2020 CLINICAL DATA:  Hydronephrosis, history of cervical cancer status post radiation therapy, bilateral distal ureteral obstruction managed bilateral ureteral stents, acute kidney injury and hydronephrosis EXAM: CT ABDOMEN AND PELVIS WITHOUT CONTRAST TECHNIQUE: Multidetector CT imaging of the abdomen and pelvis was performed following the standard protocol without IV contrast. COMPARISON:  09/23/2020 FINDINGS: Lower chest: No acute abnormality. Hepatobiliary: No solid liver abnormality is seen. No gallstones, gallbladder wall thickening, or biliary dilatation. Pancreas: Unremarkable. No pancreatic ductal dilatation or surrounding inflammatory changes. Spleen: Normal in size without significant abnormality. Adrenals/Urinary Tract: Adrenal glands are unremarkable. Bilateral double-J ureteral stents are positioned with formed pigtails in the renal pelves and urinary bladder. Foley catheter 5. There is moderate bilateral hydronephrosis, not significantly changed compared to prior CT. Bladder is unremarkable. Stomach/Bowel: Stomach is within normal limits. Appendix appears normal. No evidence of bowel wall thickening, distention, or inflammatory changes. Vascular/Lymphatic: Aortic atherosclerosis. There are numerous, enlarged, calcified retroperitoneal, bilateral iliac, and bilateral pelvic sidewall lymph nodes. Reproductive: Fluid-filled endometrial cavity. Other: No abdominal wall hernia or abnormality. No abdominopelvic ascites. Musculoskeletal: No acute  or significant osseous findings. IMPRESSION: 1. Bilateral double-J ureteral stents are positioned with formed pigtails in the renal pelves and urinary bladder. There is moderate bilateral hydronephrosis, not significantly changed compared to prior CT. 2. There are numerous, enlarged, calcified retroperitoneal, bilateral iliac, and bilateral pelvic sidewall lymph nodes, consistent with treated nodal metastatic disease. 3. Fluid-filled endometrial cavity, likely related to endocervical obstruction in the setting of known malignancy. Aortic Atherosclerosis (ICD10-I70.0). Electronically Signed   By: Eddie Candle M.D.   On: 09/26/2020 13:12    Scheduled Meds: . calcium acetate  1,334 mg Oral TID WC  . Chlorhexidine Gluconate Cloth  6 each Topical Q0600  . FLUoxetine  10 mg Oral Daily  . heparin  5,000 Units Subcutaneous Q8H  . levothyroxine  175 mcg Oral Daily  . oxyCODONE  10 mg Oral Q12H  . polyethylene glycol  17 g Oral Daily  . senna  1 tablet Oral QHS  . sodium chloride flush  3 mL Intravenous Q12H  . sodium chloride flush  3 mL Intravenous Q12H  . temazepam  15 mg Oral Once   Continuous Infusions: . sodium chloride    .  ceFAZolin (ANCEF) IV Stopped (09/27/20 0914)     LOS: 4 days    Time spent: 52 mins    Barton Dubois, MD [facility]   If 7PM-7AM, please contact night-coverage [email or web address]  09/27/2020, 6:34 PM

## 2020-09-28 ENCOUNTER — Ambulatory Visit (HOSPITAL_COMMUNITY)
Admit: 2020-09-28 | Discharge: 2020-09-28 | Disposition: A | Discharge status: Home / Self Care | Payer: 59 | Attending: Internal Medicine | Admitting: Internal Medicine

## 2020-09-28 HISTORY — PX: IR NEPHROSTOMY PLACEMENT LEFT: IMG6063

## 2020-09-28 HISTORY — PX: IR NEPHROSTOMY PLACEMENT RIGHT: IMG6064

## 2020-09-28 LAB — CBC
HCT: 27.7 % — ABNORMAL LOW (ref 36.0–46.0)
Hemoglobin: 9.2 g/dL — ABNORMAL LOW (ref 12.0–15.0)
MCH: 31.6 pg (ref 26.0–34.0)
MCHC: 33.2 g/dL (ref 30.0–36.0)
MCV: 95.2 fL (ref 80.0–100.0)
Platelets: 220 10*3/uL (ref 150–400)
RBC: 2.91 MIL/uL — ABNORMAL LOW (ref 3.87–5.11)
RDW: 15 % (ref 11.5–15.5)
WBC: 4.6 10*3/uL (ref 4.0–10.5)
nRBC: 0 % (ref 0.0–0.2)

## 2020-09-28 LAB — BASIC METABOLIC PANEL
Anion gap: 6 (ref 5–15)
BUN: 24 mg/dL — ABNORMAL HIGH (ref 6–20)
CO2: 24 mmol/L (ref 22–32)
Calcium: 7.6 mg/dL — ABNORMAL LOW (ref 8.9–10.3)
Chloride: 107 mmol/L (ref 98–111)
Creatinine, Ser: 2.45 mg/dL — ABNORMAL HIGH (ref 0.44–1.00)
GFR, Estimated: 23 mL/min — ABNORMAL LOW (ref >60)
Glucose, Bld: 94 mg/dL (ref 70–99)
Potassium: 4.2 mmol/L (ref 3.5–5.1)
Sodium: 137 mmol/L (ref 135–145)

## 2020-09-28 LAB — CULTURE, BLOOD (ROUTINE X 2)
Culture: NO GROWTH
Special Requests: ADEQUATE

## 2020-09-28 MED ORDER — SODIUM CHLORIDE 0.9 % IV SOLN
2.0000 g | Freq: Once | INTRAVENOUS | Status: AC
  Administered 2020-09-28: 2 g via INTRAVENOUS
  Filled 2020-09-28: qty 2

## 2020-09-28 MED ORDER — SODIUM CHLORIDE 0.9 % IV SOLN
2.0000 g | Freq: Once | INTRAVENOUS | Status: DC
  Filled 2020-09-28 (×2): qty 20

## 2020-09-28 MED ORDER — LIDOCAINE HCL 1 % IJ SOLN
INTRAMUSCULAR | Status: AC | PRN
  Administered 2020-09-28: 20 mL

## 2020-09-28 MED ORDER — IOHEXOL 300 MG/ML  SOLN
50.0000 mL | Freq: Once | INTRAMUSCULAR | Status: AC | PRN
  Administered 2020-09-28: 20 mL

## 2020-09-28 MED ORDER — CEFAZOLIN SODIUM-DEXTROSE 2-4 GM/100ML-% IV SOLN
2.0000 g | Freq: Three times a day (TID) | INTRAVENOUS | Status: DC
  Administered 2020-09-28 – 2020-10-04 (×17): 2 g via INTRAVENOUS
  Filled 2020-09-28 (×17): qty 100

## 2020-09-28 MED ORDER — FENTANYL CITRATE (PF) 100 MCG/2ML IJ SOLN
INTRAMUSCULAR | Status: AC | PRN
  Administered 2020-09-28: 50 ug via INTRAVENOUS
  Administered 2020-09-28 (×2): 25 ug via INTRAVENOUS

## 2020-09-28 MED ORDER — LIDOCAINE HCL (PF) 1 % IJ SOLN
INTRAMUSCULAR | Status: AC
  Filled 2020-09-28: qty 30

## 2020-09-28 MED ORDER — SODIUM CHLORIDE 0.9 % IV SOLN: INTRAVENOUS | Status: DC

## 2020-09-28 MED ORDER — MIDAZOLAM HCL 2 MG/2ML IJ SOLN
INTRAMUSCULAR | Status: AC | PRN
  Administered 2020-09-28 (×3): 1 mg via INTRAVENOUS

## 2020-09-28 MED ORDER — MIDAZOLAM HCL 2 MG/2ML IJ SOLN
INTRAMUSCULAR | Status: AC
  Filled 2020-09-28: qty 4

## 2020-09-28 MED ORDER — FENTANYL CITRATE (PF) 100 MCG/2ML IJ SOLN
INTRAMUSCULAR | Status: AC
  Filled 2020-09-28: qty 4

## 2020-09-28 NOTE — Consult Note (Signed)
Chief Complaint: Bilateral hydronephrosis. Request is for bilateral nephrostomy tube placement  Referring Physician(s): Dr. Meriam Sprague  Supervising Physician: Arne Cleveland  Patient Status: [facility] - inpatient  History of Present Illness: Tanya Wright is a 55 y.o. female History of HTN, Hypothyroidism, cervical cancer s/p radiation with radiation induced bilateral distal ureteral obstruction managed by bilateral ureteral stents placed at [facility].  Last exchanged on 2.2022. Patient presented to ED at [facility] on 6.2.22 with fever, chills, generalized body aches, abdominal pain, nausea and vomiting.  Found to have urinary sepsis with bilateral hydropyonephrosis and AKI.  A foley catheter was placed with no improvement in renal function. CT renal stone study from 6.5.22 reads Bilateral double-J ureteral stents are positioned with formed pigtails in the renal pelves and urinary bladder. There is moderate bilateral hydronephrosis, not significantly changed compared to prior CT. Team is requesting bilateral nephrostomy tube placement.  Currently without any significant complaints. Patient alert and laying in bed, calm and comfortable. Denies any fevers, headache, chest pain, SOB, cough, abdominal pain, nausea, vomiting or bleeding. Return precautions and treatment recommendations and follow-up discussed with the patient who is agreeable with the plan.    Past Medical History:  Diagnosis Date  . Cancer ([place])   . Cervical cancer ([place])   . Hypertension   . Renal disorder   . Thyroid disease     Past Surgical History:  Procedure Laterality Date  . STENT PLACEMENT RT URETER ([place] [facility])     sent in both kidneys for stones  . TUBAL LIGATION      Allergies: Oxycodone  Medications: Prior to Admission medications   Medication Sig Start Date End Date Taking? Authorizing Provider  calcium acetate (PHOSLO) 667 MG capsule Take 1,334 mg by mouth 3 (three) times daily with meals.   Yes [provider]   FLUoxetine (PROZAC) 10 MG capsule Take 10 mg by mouth daily. 03/02/17 09/23/20 Yes [provider]  levothyroxine (SYNTHROID) 175 MCG tablet Take 175 mcg by mouth daily. 09/01/20  Yes [provider]  oxyCODONE (OXY IR/ROXICODONE) 5 MG immediate release tablet Take 1 tablet by mouth every 4 (four) hours as needed. 09/10/20  Yes [provider]  polyethylene glycol (MIRALAX / GLYCOLAX) packet Take 17 g by mouth daily. 02/22/17  Yes [name], MD  senna (SENOKOT) 8.6 MG tablet Take 1 tablet by mouth at bedtime.   Yes [provider]  XTAMPZA ER 18 MG C12A Take 1 capsule by mouth 2 (two) times daily. 09/01/20  Yes [provider]  oxyCODONE-acetaminophen (PERCOCET/ROXICET) 5-325 MG tablet Take 1 tablet by mouth every 6 (six) hours as needed for severe pain. Patient not taking: No sig reported 02/04/19   Long, Wonda Olds, MD  penicillin v potassium (VEETID) 500 MG tablet Take 1 tablet by mouth daily. Patient not taking: Reported on 09/23/2020 01/29/19   [provider]  SUMAtriptan (IMITREX) 25 MG tablet Take 1 tablet (25 mg total) by mouth once for 1 dose. Repeat in 2 hours if headache persists.  Do not take more than two days in a week. Patient not taking: Reported on 02/04/2019 04/19/17 04/19/17  Elodia Florence., MD     History reviewed. No pertinent family history.  Social History   Socioeconomic History  . Marital status: Divorced    Spouse name: Not on file  . Number of children: Not on file  . Years of education: Not on file  . Highest education level: Not on file  Occupational History  . Not on file  Tobacco Use  . Smoking status: Former Smoker    Types: Cigarettes  . Smokeless tobacco: Never Used  Substance and Sexual Activity  . Alcohol use: Yes  . Drug use: Yes    Types: Marijuana  . Sexual activity: Yes  Other Topics Concern  . Not on file  Social History Narrative  . Not on file   Social Determinants of Health    Financial Resource Strain: Not on file  Food Insecurity: Not on file  Transportation Needs: Not on file  Physical Activity: Not on file  Stress: Not on file  Social Connections: Not on file     Review of Systems: A 12 point ROS discussed and pertinent positives are indicated in the HPI above.  All other systems are negative.  Review of Systems  Constitutional: Negative for fatigue and fever.  HENT: Negative for congestion.   Respiratory: Negative for cough and shortness of breath.   Gastrointestinal: Negative for abdominal pain, diarrhea, nausea and vomiting.    Vital Signs: BP (!) 139/101   Pulse 85   Temp 100 F (37.8 C) (Oral)   Resp 18   Ht 5\' 11"  (1.803 m)   Wt 200 lb 9.9 oz (91 kg)   LMP 02/28/2017   SpO2 100%   BMI 27.98 kg/m   Physical Exam Vitals and nursing note reviewed.  Constitutional:      Appearance: She is well-developed.  HENT:     Head: Normocephalic and atraumatic.  Eyes:     Conjunctiva/sclera: Conjunctivae normal.  Pulmonary:     Effort: Pulmonary effort is normal.     Breath sounds: Normal breath sounds.  Musculoskeletal:        General: Normal range of motion.     Cervical back: Normal range of motion.  Skin:    General: Skin is warm.  Neurological:     Mental Status: She is alert and oriented to person, place, and time.     Imaging: US Renal  Result Date: 09/23/2020 CLINICAL DATA:  Worsening serum creatinine.  Bilateral renal stents EXAM: RENAL / URINARY TRACT ULTRASOUND COMPLETE COMPARISON:  09/23/2020 FINDINGS: Right Kidney: Renal measurements: 12.8 x 5.0 x 5.1 cm = volume: 172 mL. Increased renal cortical echogenicity. Mild hydronephrosis. No mass or shadowing stone visualized. Left Kidney: Renal measurements: 12.5 x 6.5 x 5.4 cm = volume: 228 mL. Increased renal cortical echogenicity. Mild hydronephrosis. No mass or shadowing stone visualized. Bladder: Bilateral ureteral stents are present. Bladder appears otherwise unremarkable  for the degree of distension. Other: None. IMPRESSION: 1. Mild bilateral hydronephrosis. Bilateral nephroureteral stents are in place, better evaluated on recently obtained CT. 2. Increased renal cortical echogenicity suggesting underlying medical renal disease. Electronically Signed   By: Davina Poke D.O.   On: 09/23/2020 15:04   DG Chest Portable 1 View  Result Date: 09/23/2020 CLINICAL DATA:  55 year old female with chest pain on the right. Fever, chills, headache. History of cervical cancer. EXAM: PORTABLE CHEST 1 VIEW COMPARISON:  Chest radiographs 04/16/2017. FINDINGS: Portable AP upright view at 1114 hours. Stable right chest Port-A-Cath. Allowing for portable technique lung volumes and mediastinal contours remain within normal limits, lungs are clear. No pneumothorax. Interval thyroidectomy, with resolved rightward shift of the trachea at the thoracic inlet. No acute osseous abnormality identified. Paucity of bowel gas in the upper abdomen. IMPRESSION: No acute cardiopulmonary abnormality. Electronically Signed   By: Genevie Ann M.D.   On: 09/23/2020 11:21  ECHOCARDIOGRAM COMPLETE  Result Date: 09/26/2020    ECHOCARDIOGRAM REPORT   Patient Name:   JOSELYNNE Quizhpi Date of Exam: 09/26/2020 Medical Rec #:  [id number]    Height:       69.0 in Accession #:    [id number]   Weight:       200.4 lb Date of Birth:  05/03/1965    BSA:          2.068 m Patient Age:    1 years     BP:           128/76 mmHg Patient Gender: F            HR:           89 bpm. Exam Location:  [facility] Procedure: 2D Echo, Cardiac Doppler and Color Doppler Indications:    Bacteremia R78.81  History:        Patient has prior history of Echocardiogram examinations, most                 recent 04/17/2017. Risk Factors:Hypertension. CKD (chronic                 kidney disease) Thyroid mass of unclear                 etiology. Uterine cervix cancer Patient.  Sonographer:    Alvino Chapel RCS Referring Phys: [place]  1.  Left ventricular ejection fraction, by estimation, is 55 to 60%. The left ventricle has normal function. The left ventricle has no regional wall motion abnormalities. There is mild left ventricular hypertrophy. Left ventricular diastolic parameters were normal.  2. Right ventricular systolic function is normal. The right ventricular size is normal. There is normal pulmonary artery systolic pressure. The estimated right ventricular systolic pressure is 76.8 mmHg.  3. Left atrial size was mildly dilated.  4. There is a trivial pericardial effusion posterior to the left ventricle.  5. The mitral valve is grossly normal. Mild mitral valve regurgitation.  6. The aortic valve is tricuspid. Aortic valve regurgitation is not visualized.  7. The inferior vena cava is dilated in size with >50% respiratory variability, suggesting right atrial pressure of 8 mmHg.  8. No obvious valvular vegetations. FINDINGS  Left Ventricle: Left ventricular ejection fraction, by estimation, is 55 to 60%. The left ventricle has normal function. The left ventricle has no regional wall motion abnormalities. The left ventricular internal cavity size was normal in size. There is  mild left ventricular hypertrophy. Left ventricular diastolic parameters were normal. Right Ventricle: The right ventricular size is normal. No increase in right ventricular wall thickness. Right ventricular systolic function is normal. There is normal pulmonary artery systolic pressure. The tricuspid regurgitant velocity is 2.53 m/s, and  with an assumed right atrial pressure of 8 mmHg, the estimated right ventricular systolic pressure is 11.5 mmHg. Left Atrium: Left atrial size was mildly dilated. Right Atrium: Right atrial size was normal in size. Pericardium: Trivial pericardial effusion is present. The pericardial effusion is posterior to the left ventricle. Mitral Valve: The mitral valve is grossly normal. There is mild thickening of the mitral valve leaflet(s). Mild  mitral valve regurgitation. Tricuspid Valve: The tricuspid valve is grossly normal. Tricuspid valve regurgitation is trivial. Aortic Valve: The aortic valve is tricuspid. Aortic valve regurgitation is not visualized. Pulmonic Valve: The pulmonic valve was grossly normal. Pulmonic valve regurgitation is trivial. Aorta: The aortic root is normal in size and structure. Venous: The inferior vena  cava is dilated in size with greater than 50% respiratory variability, suggesting right atrial pressure of 8 mmHg. IAS/Shunts: No atrial level shunt detected by color flow Doppler.  LEFT VENTRICLE PLAX 2D LVIDd:         4.81 cm  Diastology LVIDs:         3.33 cm  LV e' medial:    8.36 cm/s LV PW:         1.20 cm  LV E/e' medial:  11.6 LV IVS:        1.20 cm  LV e' lateral:   8.55 cm/s LVOT diam:     2.00 cm  LV E/e' lateral: 11.3 LV SV:         80 LV SV Index:   39 LVOT Area:     3.14 cm  RIGHT VENTRICLE RV S prime:     16.90 cm/s TAPSE (M-mode): 2.3 cm LEFT ATRIUM             Index       RIGHT ATRIUM           Index LA diam:        4.00 cm 1.93 cm/m  RA Area:     18.10 cm LA Vol (A2C):   71.1 ml 34.39 ml/m RA Volume:   43.10 ml  20.84 ml/m LA Vol (A4C):   83.7 ml 40.48 ml/m LA Biplane Vol: 78.4 ml 37.92 ml/m  AORTIC VALVE LVOT Vmax:   116.00 cm/s LVOT Vmean:  77.100 cm/s LVOT VTI:    0.254 m  AORTA Ao Root diam: 3.70 cm MITRAL VALVE                TRICUSPID VALVE MV Area (PHT): 3.30 cm     TR Peak grad:   25.6 mmHg MV Decel Time: 230 msec     TR Vmax:        253.00 cm/s MV E velocity: 96.80 cm/s MV A velocity: 113.00 cm/s  SHUNTS MV E/A ratio:  0.86         Systemic VTI:  0.25 m                             Systemic Diam: 2.00 cm Rozann Lesches MD Electronically signed by Rozann Lesches MD Signature Date/Time: 09/26/2020/11:16:12 AM    Final    CT RENAL STONE STUDY  Result Date: 09/26/2020 CLINICAL DATA:  Hydronephrosis, history of cervical cancer status post radiation therapy, bilateral distal ureteral obstruction  managed bilateral ureteral stents, acute kidney injury and hydronephrosis EXAM: CT ABDOMEN AND PELVIS WITHOUT CONTRAST TECHNIQUE: Multidetector CT imaging of the abdomen and pelvis was performed following the standard protocol without IV contrast. COMPARISON:  09/23/2020 FINDINGS: Lower chest: No acute abnormality. Hepatobiliary: No solid liver abnormality is seen. No gallstones, gallbladder wall thickening, or biliary dilatation. Pancreas: Unremarkable. No pancreatic ductal dilatation or surrounding inflammatory changes. Spleen: Normal in size without significant abnormality. Adrenals/Urinary Tract: Adrenal glands are unremarkable. Bilateral double-J ureteral stents are positioned with formed pigtails in the renal pelves and urinary bladder. Foley catheter 5. There is moderate bilateral hydronephrosis, not significantly changed compared to prior CT. Bladder is unremarkable. Stomach/Bowel: Stomach is within normal limits. Appendix appears normal. No evidence of bowel wall thickening, distention, or inflammatory changes. Vascular/Lymphatic: Aortic atherosclerosis. There are numerous, enlarged, calcified retroperitoneal, bilateral iliac, and bilateral pelvic sidewall lymph nodes. Reproductive: Fluid-filled endometrial cavity. Other: No abdominal wall hernia or abnormality. No  abdominopelvic ascites. Musculoskeletal: No acute or significant osseous findings. IMPRESSION: 1. Bilateral double-J ureteral stents are positioned with formed pigtails in the renal pelves and urinary bladder. There is moderate bilateral hydronephrosis, not significantly changed compared to prior CT. 2. There are numerous, enlarged, calcified retroperitoneal, bilateral iliac, and bilateral pelvic sidewall lymph nodes, consistent with treated nodal metastatic disease. 3. Fluid-filled endometrial cavity, likely related to endocervical obstruction in the setting of known malignancy. Aortic Atherosclerosis (ICD10-I70.0). Electronically Signed   By:  Eddie Candle M.D.   On: 09/26/2020 13:12   CT Renal Stone Study  Result Date: 09/23/2020 CLINICAL DATA:  Right flank pain and hematuria for the past day. History of cervical cancer. EXAM: CT ABDOMEN AND PELVIS WITHOUT CONTRAST TECHNIQUE: Multidetector CT imaging of the abdomen and pelvis was performed following the standard protocol without IV contrast. COMPARISON:  CT abdomen pelvis dated February 04, 2019. FINDINGS: Lower chest: No acute abnormality. Hepatobiliary: No focal liver abnormality is seen. No gallstones, gallbladder wall thickening, or biliary dilatation. Pancreas: Unchanged small calcifications in the pancreatic head consistent with chronic pancreatitis. No ductal dilatation or surrounding inflammatory changes. Spleen: Normal in size without focal abnormality. Adrenals/Urinary Tract: Adrenal glands are unremarkable. Bilateral double-J ureteral stents again noted and appropriately positioned. Chronic bilateral hydronephrosis, mildly improved compared to prior study. No renal calculi. Mild circumferential bladder wall thickening. Stomach/Bowel: Stomach is within normal limits. Appendix appears normal. No evidence of bowel wall thickening, distention, or inflammatory changes. Vascular/Lymphatic: Chronic densely calcified retroperitoneal and pelvic lymphadenopathy, similar to prior study. Aortic atherosclerosis. Reproductive: Improved but continued distension of the endometrial canal, which measures up to 1.9 cm (series 6, image 55), previously 3.6 cm. No adnexal mass. Other: Trace free fluid in the pelvis.  No pneumoperitoneum. Musculoskeletal: No acute or significant osseous findings. IMPRESSION: 1. No acute intra-abdominal process. 2. Bilateral double-J ureteral stents with chronic bilateral hydronephrosis, mildly improved compared to prior study. 3. Improved but continued distension of the endometrial canal, which measures up to 1.9 cm, previously 3.6 cm. This may reflect obstruction related to  cervical cancer/post treatment changes. Consider pelvic ultrasound for further evaluation. 4. Chronic densely calcified retroperitoneal and pelvic lymphadenopathy, similar to prior study, consistent with treated metastatic disease. 5. Aortic Atherosclerosis (ICD10-I70.0). Electronically Signed   By: Titus Dubin M.D.   On: 09/23/2020 13:13    Labs:  CBC: Recent Labs    09/25/20 0521 09/26/20 0351 09/27/20 0439 09/28/20 0425  WBC 6.7 5.4 4.8 4.6  HGB 10.0* 9.3* 9.4* 9.2*  HCT 30.8* 29.1* 29.6* 27.7*  PLT 134* 133* 185 220    COAGS: Recent Labs    09/23/20 1609 09/24/20 0510  INR 1.2 1.2  APTT 27  --     BMP: Recent Labs    09/25/20 0521 09/26/20 0351 09/27/20 0439 09/28/20 0425  NA 133* 134* 139 137  K 3.6 3.7 4.2 4.2  CL 102 104 108 107  CO2 22 23 24 24   GLUCOSE 100* 106* 110* 94  BUN 39* 33* 28* 24*  CALCIUM 7.2* 7.0* 7.5* 7.6*  CREATININE 2.96* 2.84* 2.85* 2.45*  GFRNONAA 18* 19* 19* 23*    LIVER FUNCTION TESTS: Recent Labs    09/23/20 1609  BILITOT 1.1  AST 26  ALT 23  ALKPHOS 67  PROT 6.6  ALBUMIN 2.8*     Assessment and Plan:  55 y.o. female inpatient. History of HTN, Hypothyroidism, cervical cancer s/p radiation with radiation induced bilateral distal ureteral obstruction managed by bilateral ureteral stents placed at  [facility].  Last exchanged on 2.2022. Patient presented to ED at [facility] on 6.2.22 with fever, chills, generalized body aches, abdominal pain, nausea and vomiting.  Found to have urinary sepsis with bilateral hydropyonephrosis and AKI.  A foley catheter was placed with no improvement in renal function. CT renal stone study from 6.5.22 reads Bilateral double-J ureteral stents are positioned with formed pigtails in the renal pelves and urinary bladder. There is moderate bilateral hydronephrosis, not significantly changed compared to prior CT. Team is requesting bilateral nephrostomy tube placement.  WBC is 4.6, BUN 24, Cr 2.45, Calcium 7.6, GFR  23.  Patient is on subcutaneous prophylactic dose of heparin. Last dose given on 6.6.22 @ 21:29. Allergies include oxycodone. Patient has been NPO since midnight.  Risks and benefits of bilateral  PCN placement was discussed with the patient including, but not limited to, infection, bleeding, significant bleeding causing loss or decrease in renal function or damage to adjacent structures.   All of the patient's questions were answered, patient is agreeable to proceed.  Consent signed and in chart.   Thank you for this interesting consult.  I greatly enjoyed meeting Leeah Politano and look forward to participating in their care.  A copy of this report was sent to the requesting provider on this date.  Electronically Signed: Jacqualine Mau, NP 09/28/2020, 11:07 AM   I spent a total of 40 Minutes    in face to face in clinical consultation, greater than 50% of which was counseling/coordinating care for bilateral nephrostomy tube placement

## 2020-09-28 NOTE — Progress Notes (Signed)
PROGRESS NOTE    Tanya Wright  TWS:[id number] DOB: 27-Oct-1965 DOA: 09/23/2020 PCP: System, Provider Not In    Brief Narrative:  55 year old female with a history of cervical cancer status post radiation therapy and bilateral distal ureteral obstruction managed with bilateral ureteral stents.  She is followed at [facility] urology and oncology.  Last stent exchange was 05/2020.  She is admitted to the hospital with sepsis secondary to urinary source.  She is also noted to have acute kidney injury and bilateral hydronephrosis.  Urology following.  Foley catheter placed for bladder decompression.  May need to consider percutaneous nephrostomies if renal function does not improve   Assessment & Plan:   Principal Problem:   UTI (urinary tract infection) Active Problems:   Uterine cervix cancer (HCC)   CKD (chronic kidney disease) stage 3, GFR 30-59 ml/min (HCC)   Anemia in other chronic diseases classified elsewhere   AKI (acute kidney injury) ([place])   Sepsis ([place])   MSSA bacteremia   Bilateral hydronephrosis   Sepsis due to MSSA bacteremia and urinary tract infection -Patient noted to be febrile, tachycardic, with source of infection being urine and evidence of endorgan damage with acute kidney injury -Initially on Rocephin -She did have 1 out of 2 blood cultures positive for MSSA - Discussed with Dr. Juleen China and antibiotics changed to Ancef -repeat cultures sent 09/26/2020; no growth so far. -2D echo ordered, and demonstrated no vegetations.  Cardiology service has been consulted for TEE.  Planning TEE tomorrow -she does have a port for chemotherapy, pending final decision for removal by IR and infectious disease.. -Hemodynamics currently stable -C low-grade temperature appreciated overnight. -Continue to follow final repeat blood cultures  Acute kidney injury on chronic kidney disease stage IIIa -Bilateral hydronephrosis with presence of double-J stents -Seen by urology with recommendations  for continued IV hydration through the weekend and decompression of bladder with Foley catheter. -IR has been contacted for assistance with Port-A-Cath removal and bilateral nephrostomy tubes. -Continue to follow any further urology service recommendations. -Baseline creatinine around 1.97 -Creatinine on admission noted to be 3.1 -creatinine slightly improved at 2.45 currently. Some Increased in urine output also appreciated.   -Repeat CT scan demonstrated persistent hydronephrosis and IR has been consulted for percutaneous nephrostomy tubes; procedure Elevated today.  Stage IV cervical cancer -She is followed at [place] follow-up as an outpatient  Anemia of chronic disease -No signs of bleeding -Follow hemoglobin trend.  Hypothyroidism -Continue home dose Synthroid  Chronic pain syndrome -Continued home regimen of oxycodone and OxyContin  Depression -Continue on Prozac   DVT prophylaxis: heparin injection 5,000 Units Start: 09/23/20 1430 TED hose Start: 09/23/20 1416 SCDs Start: 09/23/20 1416  Code Status: Full code Family Communication: Discussed with patient Disposition Plan: Status is: Inpatient  Remains inpatient appropriate because:IV treatments appropriate due to intensity of illness or inability to take PO and Inpatient level of care appropriate due to severity of illness   Dispo: The patient is from: Home              Anticipated d/c is to: Home              Patient currently is not medically stable to d/c.   Difficult to place patient no    Consultants:   Urology  Infectious Disease (phone)  Cardiology service  IR  Procedures:     Antimicrobials:   Ceftriaxone 6/2 >6/4  Ancef 6/4>   Subjective: Patient anxious about upcoming procedure; low-grade temperature  appreciated.  No chest pain, no nausea, no vomiting.  Objective: Vitals:   09/28/20 0614 09/28/20 0918 09/28/20 1346 09/28/20 1455  BP: 102/70 (!) 139/101 (!)  124/91 110/79  Pulse: 85 85 75 65  Resp:      Temp: 100 F (37.8 C)  98.2 F (36.8 C) 98.7 F (37.1 C)  TempSrc: Oral  Oral Oral  SpO2: 98% 100% 100% 99%  Weight:      Height:        Intake/Output Summary (Last 24 hours) at 09/28/2020 1650 Last data filed at 09/28/2020 1215 Gross per 24 hour  Intake 340 ml  Output 4400 ml  Net -4060 ml   Filed Weights   09/26/20 1313 09/27/20 0500 09/28/20 0500  Weight: 91.5 kg 91 kg 91 kg    Examination: General exam: Alert, awake, oriented x 3; low-grade temperature overnight appreciated.  Patient anxious about anticipated procedures.  No chest pain, no nausea, no vomiting. Respiratory system: Clear to auscultation. Respiratory effort normal. Cardiovascular system:RRR. No murmurs, rubs, gallops.  No JVD. Gastrointestinal system: Abdomen is nondistended, soft and nontender. No organomegaly or masses felt. Normal bowel sounds heard. Central nervous system: Alert and oriented. No focal neurological deficits. Extremities: No cyanosis or clubbing. Skin: No rashes, lesions or ulcers Psychiatry: Judgement and insight appear normal. Mood & affect appropriate.    Data Reviewed: I have personally reviewed following labs and imaging studies  CBC: Recent Labs  Lab 09/23/20 1609 09/24/20 0510 09/25/20 0521 09/26/20 0351 09/27/20 0439 09/28/20 0425  WBC 11.2* 9.1 6.7 5.4 4.8 4.6  NEUTROABS 10.1*  --   --   --   --   --   HGB 11.0* 10.5* 10.0* 9.3* 9.4* 9.2*  HCT 34.2* 32.2* 30.8* 29.1* 29.6* 27.7*  MCV 97.4 97.3 96.3 96.7 99.0 95.2  PLT 155 143* 134* 133* 185 751   Basic Metabolic Panel: Recent Labs  Lab 09/24/20 0510 09/25/20 0521 09/26/20 0351 09/27/20 0439 09/28/20 0425  NA 132* 133* 134* 139 137  K 4.0 3.6 3.7 4.2 4.2  CL 100 102 104 108 107  CO2 24 22 23 24 24   GLUCOSE 102* 100* 106* 110* 94  BUN 39* 39* 33* 28* 24*  CREATININE 3.00* 2.96* 2.84* 2.85* 2.45*  CALCIUM 7.6* 7.2* 7.0* 7.5* 7.6*   GFR: Estimated Creatinine  Clearance: 31.9 mL/min (A) (by C-G formula based on SCr of 2.45 mg/dL (H)).   Liver Function Tests: Recent Labs  Lab 09/23/20 1609  AST 26  ALT 23  ALKPHOS 67  BILITOT 1.1  PROT 6.6  ALBUMIN 2.8*   Coagulation Profile: Recent Labs  Lab 09/23/20 1609 09/24/20 0510  INR 1.2 1.2   CBG: Recent Labs  Lab 09/24/20 0811 09/25/20 0746 09/27/20 0723  GLUCAP 99 97 124*   Sepsis Labs: Recent Labs  Lab 09/23/20 1609 09/23/20 2226  LATICACIDVEN 1.1 1.0    Recent Results (from the past 240 hour(s))  Resp Panel by RT-PCR (Flu A&B, Covid) Nasopharyngeal Swab     Status: None   Collection Time: 09/23/20 11:23 AM   Specimen: Nasopharyngeal Swab; Nasopharyngeal(NP) swabs in vial transport medium  Result Value Ref Range Status   SARS Coronavirus 2 by RT PCR NEGATIVE NEGATIVE Final    Comment: (NOTE) SARS-CoV-2 target nucleic acids are NOT DETECTED.  The SARS-CoV-2 RNA is generally detectable in upper respiratory specimens during the acute phase of infection. The lowest concentration of SARS-CoV-2 viral copies this assay can detect is 138 copies/mL. A negative result  does not preclude SARS-Cov-2 infection and should not be used as the sole basis for treatment or other patient management decisions. A negative result may occur with  improper specimen collection/handling, submission of specimen other than nasopharyngeal swab, presence of viral mutation(s) within the areas targeted by this assay, and inadequate number of viral copies(<138 copies/mL). A negative result must be combined with clinical observations, patient history, and epidemiological information. The expected result is Negative.  Fact Sheet for Patients:  EntrepreneurPulse.com.au  Fact Sheet for Healthcare Providers:  IncredibleEmployment.be  This test is no t yet approved or cleared by the [place] FDA and  has been authorized for detection and/or diagnosis of SARS-CoV-2  by FDA under an Emergency Use Authorization (EUA). This EUA will remain  in effect (meaning this test can be used) for the duration of the COVID-19 declaration under Section 564(b)(1) of the Act, 21 U.S.C.section 360bbb-3(b)(1), unless the authorization is terminated  or revoked sooner.       Influenza A by PCR NEGATIVE NEGATIVE Final   Influenza B by PCR NEGATIVE NEGATIVE Final    Comment: (NOTE) The Xpert Xpress SARS-CoV-2/FLU/RSV plus assay is intended as an aid in the diagnosis of influenza from Nasopharyngeal swab specimens and should not be used as a sole basis for treatment. Nasal washings and aspirates are unacceptable for Xpert Xpress SARS-CoV-2/FLU/RSV testing.  Fact Sheet for Patients: EntrepreneurPulse.com.au  Fact Sheet for Healthcare Providers: IncredibleEmployment.be  This test is not yet approved or cleared by the [place] FDA and has been authorized for detection and/or diagnosis of SARS-CoV-2 by FDA under an Emergency Use Authorization (EUA). This EUA will remain in effect (meaning this test can be used) for the duration of the COVID-19 declaration under Section 564(b)(1) of the Act, 21 U.S.C. section 360bbb-3(b)(1), unless the authorization is terminated or revoked.  Performed at [facility], [address] [place], [place] [address]   Culture, blood (routine x 2)     Status: Abnormal   Collection Time: 09/23/20  4:09 PM   Specimen: BLOOD RIGHT FOREARM  Result Value Ref Range Status   Specimen Description   Final    BLOOD RIGHT FOREARM Performed at [facility], [address] [place], [place] [address]    Special Requests   Final    Blood Culture results may not be optimal due to an inadequate volume of blood received in culture bottles BOTTLES DRAWN AEROBIC AND ANAEROBIC Performed at [facility], [address], [place], [place] [address]    Culture  Setup Time   Final    GRAM POSITIVE COCCI BOTH AEROBIC AND  ANAEROBIC BOTTLES Gram Stain Report Called to,Read Back By and Verified With: [facility] CAULDER  09/24/20 @ 1011 BY S BEARD CRITICAL RESULT CALLED TO, READ BACK BY AND VERIFIED WITH: Golden Pop RN 1700 09/24/20 A BROWNING Performed at [facility], [place] [address] [place], [place] [address]    Culture STAPHYLOCOCCUS AUREUS (A)  Final   Report Status 09/26/2020 FINAL  Final   Organism ID, Bacteria STAPHYLOCOCCUS AUREUS  Final      Susceptibility   Staphylococcus aureus - MIC*    CIPROFLOXACIN <=0.5 SENSITIVE Sensitive     ERYTHROMYCIN <=0.25 SENSITIVE Sensitive     GENTAMICIN <=0.5 SENSITIVE Sensitive     OXACILLIN <=0.25 SENSITIVE Sensitive     TETRACYCLINE <=1 SENSITIVE Sensitive     VANCOMYCIN 1 SENSITIVE Sensitive     TRIMETH/SULFA <=10 SENSITIVE Sensitive     CLINDAMYCIN <=0.25 SENSITIVE Sensitive  RIFAMPIN <=0.5 SENSITIVE Sensitive     Inducible Clindamycin NEGATIVE Sensitive     * STAPHYLOCOCCUS AUREUS  Culture, blood (routine x 2)     Status: None   Collection Time: 09/23/20  4:09 PM   Specimen: BLOOD  Result Value Ref Range Status   Specimen Description BLOOD LEFT ANTECUBITAL  Final   Special Requests   Final    Blood Culture adequate volume BOTTLES DRAWN AEROBIC AND ANAEROBIC   Culture   Final    NO GROWTH 5 DAYS Performed at [facility], [address] [place], [place] [address]    Report Status 09/28/2020 FINAL  Final  Blood Culture ID Panel (Reflexed)     Status: Abnormal   Collection Time: 09/23/20  4:09 PM  Result Value Ref Range Status   Enterococcus faecalis NOT DETECTED NOT DETECTED Final   Enterococcus Faecium NOT DETECTED NOT DETECTED Final   Listeria monocytogenes NOT DETECTED NOT DETECTED Final   Staphylococcus species DETECTED (A) NOT DETECTED Final    Comment: CRITICAL RESULT CALLED TO, READ BACK BY AND VERIFIED WITH: Golden Pop RN 1700 09/24/20 A BROWNING    Staphylococcus aureus (BCID) DETECTED (A) NOT DETECTED Final    Comment: CRITICAL RESULT CALLED  TO, READ BACK BY AND VERIFIED WITH: Golden Pop RN 1700 09/24/20 A BROWNING    Staphylococcus epidermidis NOT DETECTED NOT DETECTED Final   Staphylococcus lugdunensis NOT DETECTED NOT DETECTED Final   Streptococcus species NOT DETECTED NOT DETECTED Final   Streptococcus agalactiae NOT DETECTED NOT DETECTED Final   Streptococcus pneumoniae NOT DETECTED NOT DETECTED Final   Streptococcus pyogenes NOT DETECTED NOT DETECTED Final   A.calcoaceticus-baumannii NOT DETECTED NOT DETECTED Final   Bacteroides fragilis NOT DETECTED NOT DETECTED Final   Enterobacterales NOT DETECTED NOT DETECTED Final   Enterobacter cloacae complex NOT DETECTED NOT DETECTED Final   Escherichia coli NOT DETECTED NOT DETECTED Final   Klebsiella aerogenes NOT DETECTED NOT DETECTED Final   Klebsiella oxytoca NOT DETECTED NOT DETECTED Final   Klebsiella pneumoniae NOT DETECTED NOT DETECTED Final   Proteus species NOT DETECTED NOT DETECTED Final   Salmonella species NOT DETECTED NOT DETECTED Final   Serratia marcescens NOT DETECTED NOT DETECTED Final   Haemophilus influenzae NOT DETECTED NOT DETECTED Final   Neisseria meningitidis NOT DETECTED NOT DETECTED Final   Pseudomonas aeruginosa NOT DETECTED NOT DETECTED Final   Stenotrophomonas maltophilia NOT DETECTED NOT DETECTED Final   Candida albicans NOT DETECTED NOT DETECTED Final   Candida auris NOT DETECTED NOT DETECTED Final   Candida glabrata NOT DETECTED NOT DETECTED Final   Candida krusei NOT DETECTED NOT DETECTED Final   Candida parapsilosis NOT DETECTED NOT DETECTED Final   Candida tropicalis NOT DETECTED NOT DETECTED Final   Cryptococcus neoformans/gattii NOT DETECTED NOT DETECTED Final   Meth resistant mecA/C and MREJ NOT DETECTED NOT DETECTED Final    Comment: Performed at [facility], [address], [place],  [address]  MRSA PCR Screening     Status: None   Collection Time: 09/23/20  5:05 PM   Specimen: Nasopharyngeal  Result Value Ref Range  Status   MRSA by PCR NEGATIVE NEGATIVE Final    Comment:        The GeneXpert MRSA Assay (FDA approved for NASAL specimens only), is one component of a comprehensive MRSA colonization surveillance program. It is not intended to diagnose MRSA infection nor to guide or monitor treatment for MRSA infections. Performed at [facility], [address],  [place], [place] [address]   Urine culture     Status: None   Collection Time: 09/25/20  9:29 AM   Specimen: Urine, Clean Catch  Result Value Ref Range Status   Specimen Description   Final    URINE, CLEAN CATCH Performed at [facility], [address], [place], [place] [address]    Special Requests   Final    NONE Performed at [facility], [address] [place], [place] [address]    Culture   Final    NO GROWTH Performed at [facility], [place] [address] [place], [place] [address]    Report Status 09/26/2020 FINAL  Final  Culture, blood (routine x 2)     Status: None (Preliminary result)   Collection Time: 09/26/20  3:51 AM   Specimen: BLOOD RIGHT HAND  Result Value Ref Range Status   Specimen Description BLOOD RIGHT HAND  Final   Special Requests   Final    BOTTLES DRAWN AEROBIC AND ANAEROBIC Blood Culture adequate volume   Culture   Final    NO GROWTH 2 DAYS Performed at [facility], [address], [place], [place] [address]    Report Status PENDING  Incomplete  Culture, blood (routine x 2)     Status: None (Preliminary result)   Collection Time: 09/26/20  3:58 AM   Specimen: BLOOD LEFT HAND  Result Value Ref Range Status   Specimen Description BLOOD LEFT HAND  Final   Special Requests   Final    BOTTLES DRAWN AEROBIC AND ANAEROBIC Blood Culture adequate volume   Culture   Final    NO GROWTH 2 DAYS Performed at [facility], [address] [place], [place] [address]    Report Status PENDING  Incomplete     Radiology Studies: IR NEPHROSTOMY PLACEMENT LEFT  Result Date: 09/28/2020 INDICATION: 55 year old  female with a history of hydronephrosis secondary to cervical cancer, with the baseline imaging diagnosis 02/19/2017. The patient has had bilateral ureteral stents in the past as a treatment, now with persisting hydronephrosis in the setting of double-J ureteral stents. EXAM: IR NEPHROSTOMY PLACEMENT RIGHT; IR NEPHROSTOMY PLACEMENT LEFT COMPARISON:  CT 09/26/2020 MEDICATIONS: 2 g Rocephin IV ANESTHESIA/SEDATION: Fentanyl 100 mcg IV; Versed 3.0 mg IV Moderate Sedation Time:  30 minutes The patient was continuously monitored during the procedure by the interventional radiology nurse under my direct supervision. CONTRAST:  86mL OMNIPAQUE IOHEXOL 300 MG/ML SOLN - administered into the collecting system(s) FLUOROSCOPY TIME:  Fluoroscopy Time: 3 minutes 30 seconds (114 mGy). COMPLICATIONS: None PROCEDURE: Informed written consent was obtained from the patient after a thorough discussion of the procedural risks, benefits and alternatives. All questions were addressed. Maximal Sterile Barrier Technique was utilized including caps, mask, sterile gowns, sterile gloves, sterile drape, hand hygiene and skin antiseptic. A timeout was performed prior to the initiation of the procedure. Patient positioned prone position on the fluoroscopy table. Ultrasound survey of the bilateral flank was performed with images stored and sent to PACs. Left: The patient was then prepped and draped in the usual sterile fashion. 1% lidocaine was used to anesthetize the skin and subcutaneous tissues for local anesthesia. A Chiba needle was then used to access a posterior inferior calyx of the left kidney with ultrasound guidance. With spontaneous urine returned through the needle, passage of an 018 micro wire into the collecting system was performed under fluoroscopy. A small incision was made with an 11 blade scalpel, and the needle was removed from the wire. An  [place] system was then advanced over the wire into the collecting system under  fluoroscopy. The metal stiffener and inner dilator were removed, and then contrast confirmed location. Bentson wire was passed into the collecting system and the sheath removed. Ten French dilation of the soft tissues was performed. Using modified Seldinger technique, a 10 French pigtail catheter drain was placed over the Bentson wire. Wire and inner stiffener removed, and the pigtail was formed in the collecting system. Small amount of contrast confirmed position of the catheter. Right: 1% lidocaine was used to anesthetize the skin and subcutaneous tissues for local anesthesia. A Chiba needle was then used to access a posterior inferior calyx of the right kidney with ultrasound guidance. With spontaneous urine returned through the needle, passage of an 018 micro wire into the collecting system was performed under fluoroscopy. A small incision was made with an 11 blade scalpel, and the needle was removed from the wire. An [place] system was then advanced over the wire into the collecting system under fluoroscopy. The metal stiffener and inner dilator were removed. Contrast confirmed location. Bentson wire was passed into the collecting system and the sheath removed. Ten French dilation of the soft tissues was performed. Using modified Seldinger technique, a 10 French pigtail catheter drain was placed over the Bentson wire. Wire and inner stiffener removed, and the pigtail was formed in the collecting system. Small amount of contrast confirmed position of the catheter. Patient tolerated the procedure well and remained hemodynamically stable throughout. No complications were encountered and no significant blood loss encountered FINDINGS: Upon placement of the bilateral percutaneous nephrostomy, it is apparent that the patient has a relatively symmetric configuration of the kidney/collecting system, with bifurcated system. The lower pole moiety drains from the bilateral kidneys via a small short infundibulum into a  relatively small renal pelvis bilaterally. The upper pole collecting system of the bilateral kidneys is drained via the position of the indwelling bilateral double-J ureteral stent. IMPRESSION: Status post bilateral percutaneous nephrostomy, with placement of 10 French drains into the collecting system of the lower pole moiety of the kidneys, as above. The upper pole moiety of the bilateral kidneys are drained by the double-J ureteral stents and the lower pole moieties are drained via the percutaneous nephrostomy. Signed, Dulcy Fanny. Dellia Nims, RPVI [facility] Radiology Electronically Signed   By: Corrie Mckusick D.O.   On: 09/28/2020 15:11   IR NEPHROSTOMY PLACEMENT RIGHT  Result Date: 09/28/2020 INDICATION: 55 year old female with a history of hydronephrosis secondary to cervical cancer, with the baseline imaging diagnosis 02/19/2017. The patient has had bilateral ureteral stents in the past as a treatment, now with persisting hydronephrosis in the setting of double-J ureteral stents. EXAM: IR NEPHROSTOMY PLACEMENT RIGHT; IR NEPHROSTOMY PLACEMENT LEFT COMPARISON:  CT 09/26/2020 MEDICATIONS: 2 g Rocephin IV ANESTHESIA/SEDATION: Fentanyl 100 mcg IV; Versed 3.0 mg IV Moderate Sedation Time:  30 minutes The patient was continuously monitored during the procedure by the interventional radiology nurse under my direct supervision. CONTRAST:  62mL OMNIPAQUE IOHEXOL 300 MG/ML SOLN - administered into the collecting system(s) FLUOROSCOPY TIME:  Fluoroscopy Time: 3 minutes 30 seconds (114 mGy). COMPLICATIONS: None PROCEDURE: Informed written consent was obtained from the patient after a thorough discussion of the procedural risks, benefits and alternatives. All questions were addressed. Maximal Sterile Barrier Technique was utilized including caps, mask, sterile gowns, sterile gloves, sterile drape, hand hygiene and skin antiseptic. A timeout was performed prior to the  initiation of the procedure.  Patient positioned prone position on the fluoroscopy table. Ultrasound survey of the bilateral flank was performed with images stored and sent to PACs. Left: The patient was then prepped and draped in the usual sterile fashion. 1% lidocaine was used to anesthetize the skin and subcutaneous tissues for local anesthesia. A Chiba needle was then used to access a posterior inferior calyx of the left kidney with ultrasound guidance. With spontaneous urine returned through the needle, passage of an 018 micro wire into the collecting system was performed under fluoroscopy. A small incision was made with an 11 blade scalpel, and the needle was removed from the wire. An [place] system was then advanced over the wire into the collecting system under fluoroscopy. The metal stiffener and inner dilator were removed, and then contrast confirmed location. Bentson wire was passed into the collecting system and the sheath removed. Ten French dilation of the soft tissues was performed. Using modified Seldinger technique, a 10 French pigtail catheter drain was placed over the Bentson wire. Wire and inner stiffener removed, and the pigtail was formed in the collecting system. Small amount of contrast confirmed position of the catheter. Right: 1% lidocaine was used to anesthetize the skin and subcutaneous tissues for local anesthesia. A Chiba needle was then used to access a posterior inferior calyx of the right kidney with ultrasound guidance. With spontaneous urine returned through the needle, passage of an 018 micro wire into the collecting system was performed under fluoroscopy. A small incision was made with an 11 blade scalpel, and the needle was removed from the wire. An [place] system was then advanced over the wire into the collecting system under fluoroscopy. The metal stiffener and inner dilator were removed. Contrast confirmed location. Bentson wire was passed into the collecting system and  the sheath removed. Ten French dilation of the soft tissues was performed. Using modified Seldinger technique, a 10 French pigtail catheter drain was placed over the Bentson wire. Wire and inner stiffener removed, and the pigtail was formed in the collecting system. Small amount of contrast confirmed position of the catheter. Patient tolerated the procedure well and remained hemodynamically stable throughout. No complications were encountered and no significant blood loss encountered FINDINGS: Upon placement of the bilateral percutaneous nephrostomy, it is apparent that the patient has a relatively symmetric configuration of the kidney/collecting system, with bifurcated system. The lower pole moiety drains from the bilateral kidneys via a small short infundibulum into a relatively small renal pelvis bilaterally. The upper pole collecting system of the bilateral kidneys is drained via the position of the indwelling bilateral double-J ureteral stent. IMPRESSION: Status post bilateral percutaneous nephrostomy, with placement of 10 French drains into the collecting system of the lower pole moiety of the kidneys, as above. The upper pole moiety of the bilateral kidneys are drained by the double-J ureteral stents and the lower pole moieties are drained via the percutaneous nephrostomy. Signed, Dulcy Fanny. Dellia Nims, RPVI [facility] Electronically Signed   By: Corrie Mckusick D.O.   On: 09/28/2020 15:11    Scheduled Meds: . calcium acetate  1,334 mg Oral TID WC  . Chlorhexidine Gluconate Cloth  6 each Topical Q0600  . FLUoxetine  10 mg Oral Daily  . heparin  5,000 Units Subcutaneous Q8H  . levothyroxine  175 mcg Oral Daily  . oxyCODONE  10 mg Oral Q12H  . polyethylene glycol  17 g Oral Daily  . senna  1 tablet Oral QHS  . sodium chloride  flush  3 mL Intravenous Q12H  . temazepam  15 mg Oral Once   Continuous Infusions: . sodium chloride 250 mL  (09/27/20 1858)  . sodium chloride 20 mL/hr at 09/28/20 1458  .  ceFAZolin (ANCEF) IV 2 g (09/28/20 1350)  . cefTRIAXone (ROCEPHIN)  IV       LOS: 5 days    Time spent: 35 mins    Barton Dubois, MD [facility]   If 7PM-7AM, please contact night-coverage [email or web address]  09/28/2020, 4:50 PM

## 2020-09-28 NOTE — Progress Notes (Signed)
    [facility] has been asked to perform a transesophageal echocardiogram on Tanya Wright for baceteremia.  After careful review of history and examination, the risks and benefits of transesophageal echocardiogram have been explained including risks of esophageal damage, perforation (1:10,000 risk), bleeding, pharyngeal hematoma as well as other potential complications associated with conscious sedation including aspiration, arrhythmia, respiratory failure and death. Alternatives to treatment were discussed, questions were answered. Patient is willing to proceed.   TEE is scheduled for 09/29/20 at 11:30 AM with Dr. Dorris Carnes.  Richardson Dopp, PA-C  09/28/2020 9:28 AM

## 2020-09-28 NOTE — H&P (View-Only) (Signed)
PROGRESS NOTE    Tanya Wright  ZGY:[id number] DOB: [birth year] DOA: 09/23/2020 PCP: System, Provider Not In    Brief Narrative:  55 year old female with a history of cervical cancer status post radiation therapy and bilateral distal ureteral obstruction managed with bilateral ureteral stents.  She is followed at [facility] urology and oncology.  Last stent exchange was 05/2020.  She is admitted to the hospital with sepsis secondary to urinary source.  She is also noted to have acute kidney injury and bilateral hydronephrosis.  Urology following.  Foley catheter placed for bladder decompression.  May need to consider percutaneous nephrostomies if renal function does not improve   Assessment & Plan:   Principal Problem:   UTI (urinary tract infection) Active Problems:   Uterine cervix cancer (HCC)   CKD (chronic kidney disease) stage 3, GFR 30-59 ml/min (HCC)   Anemia in other chronic diseases classified elsewhere   AKI (acute kidney injury) ([place])   Sepsis ([place])   MSSA bacteremia   Bilateral hydronephrosis   Sepsis due to MSSA bacteremia and urinary tract infection -Patient noted to be febrile, tachycardic, with source of infection being urine and evidence of endorgan damage with acute kidney injury -Initially on Rocephin -She did have 1 out of 2 blood cultures positive for MSSA - Discussed with Dr. Juleen China and antibiotics changed to Ancef -repeat cultures sent 09/26/2020; no growth so far. -2D echo ordered, and demonstrated no vegetations.  Cardiology service has been consulted for TEE.  Planning TEE tomorrow -she does have a port for chemotherapy, pending final decision for removal by IR and infectious disease.. -Hemodynamics currently stable -C low-grade temperature appreciated overnight. -Continue to follow final repeat blood cultures  Acute kidney injury on chronic kidney disease stage IIIa -Bilateral hydronephrosis with presence of double-J stents -Seen by urology with recommendations  for continued IV hydration through the weekend and decompression of bladder with Foley catheter. -IR has been contacted for assistance with Port-A-Cath removal and bilateral nephrostomy tubes. -Continue to follow any further urology service recommendations. -Baseline creatinine around 1.97 -Creatinine on admission noted to be 3.1 -creatinine slightly improved at 2.45 currently. Some Increased in urine output also appreciated.   -Repeat CT scan demonstrated persistent hydronephrosis and IR has been consulted for percutaneous nephrostomy tubes; procedure Elevated today.  Stage IV cervical cancer -She is followed at [place] follow-up as an outpatient  Anemia of chronic disease -No signs of bleeding -Follow hemoglobin trend.  Hypothyroidism -Continue home dose Synthroid  Chronic pain syndrome -Continued home regimen of oxycodone and OxyContin  Depression -Continue on Prozac   DVT prophylaxis: heparin injection 5,000 Units Start: 09/23/20 1430 TED hose Start: 09/23/20 1416 SCDs Start: 09/23/20 1416  Code Status: Full code Family Communication: Discussed with patient Disposition Plan: Status is: Inpatient  Remains inpatient appropriate because:IV treatments appropriate due to intensity of illness or inability to take PO and Inpatient level of care appropriate due to severity of illness   Dispo: The patient is from: Home              Anticipated d/c is to: Home              Patient currently is not medically stable to d/c.   Difficult to place patient no    Consultants:   Urology  Infectious Disease (phone)  Cardiology service  IR  Procedures:     Antimicrobials:   Ceftriaxone 6/2 >6/4  Ancef 6/4>   Subjective: Patient anxious about upcoming procedure; low-grade temperature  appreciated.  No chest pain, no nausea, no vomiting.  Objective: Vitals:   09/28/20 0614 09/28/20 0918 09/28/20 1346 09/28/20 1455  BP: 102/70 (!) 139/101 (!)  124/91 110/79  Pulse: 85 85 75 65  Resp:      Temp: 100 F (37.8 C)  98.2 F (36.8 C) 98.7 F (37.1 C)  TempSrc: Oral  Oral Oral  SpO2: 98% 100% 100% 99%  Weight:      Height:        Intake/Output Summary (Last 24 hours) at 09/28/2020 1650 Last data filed at 09/28/2020 1215 Gross per 24 hour  Intake 340 ml  Output 4400 ml  Net -4060 ml   Filed Weights   09/26/20 1313 09/27/20 0500 09/28/20 0500  Weight: 91.5 kg 91 kg 91 kg    Examination: General exam: Alert, awake, oriented x 3; low-grade temperature overnight appreciated.  Patient anxious about anticipated procedures.  No chest pain, no nausea, no vomiting. Respiratory system: Clear to auscultation. Respiratory effort normal. Cardiovascular system:RRR. No murmurs, rubs, gallops.  No JVD. Gastrointestinal system: Abdomen is nondistended, soft and nontender. No organomegaly or masses felt. Normal bowel sounds heard. Central nervous system: Alert and oriented. No focal neurological deficits. Extremities: No cyanosis or clubbing. Skin: No rashes, lesions or ulcers Psychiatry: Judgement and insight appear normal. Mood & affect appropriate.    Data Reviewed: I have personally reviewed following labs and imaging studies  CBC: Recent Labs  Lab 09/23/20 1609 09/24/20 0510 09/25/20 0521 09/26/20 0351 09/27/20 0439 09/28/20 0425  WBC 11.2* 9.1 6.7 5.4 4.8 4.6  NEUTROABS 10.1*  --   --   --   --   --   HGB 11.0* 10.5* 10.0* 9.3* 9.4* 9.2*  HCT 34.2* 32.2* 30.8* 29.1* 29.6* 27.7*  MCV 97.4 97.3 96.3 96.7 99.0 95.2  PLT 155 143* 134* 133* 185 017   Basic Metabolic Panel: Recent Labs  Lab 09/24/20 0510 09/25/20 0521 09/26/20 0351 09/27/20 0439 09/28/20 0425  NA 132* 133* 134* 139 137  K 4.0 3.6 3.7 4.2 4.2  CL 100 102 104 108 107  CO2 24 22 23 24 24   GLUCOSE 102* 100* 106* 110* 94  BUN 39* 39* 33* 28* 24*  CREATININE 3.00* 2.96* 2.84* 2.85* 2.45*  CALCIUM 7.6* 7.2* 7.0* 7.5* 7.6*   GFR: Estimated Creatinine  Clearance: 31.9 mL/min (A) (by C-G formula based on SCr of 2.45 mg/dL (H)).   Liver Function Tests: Recent Labs  Lab 09/23/20 1609  AST 26  ALT 23  ALKPHOS 67  BILITOT 1.1  PROT 6.6  ALBUMIN 2.8*   Coagulation Profile: Recent Labs  Lab 09/23/20 1609 09/24/20 0510  INR 1.2 1.2   CBG: Recent Labs  Lab 09/24/20 0811 09/25/20 0746 09/27/20 0723  GLUCAP 99 97 124*   Sepsis Labs: Recent Labs  Lab 09/23/20 1609 09/23/20 2226  LATICACIDVEN 1.1 1.0    Recent Results (from the past 240 hour(s))  Resp Panel by RT-PCR (Flu A&B, Covid) Nasopharyngeal Swab     Status: None   Collection Time: 09/23/20 11:23 AM   Specimen: Nasopharyngeal Swab; Nasopharyngeal(NP) swabs in vial transport medium  Result Value Ref Range Status   SARS Coronavirus 2 by RT PCR NEGATIVE NEGATIVE Final    Comment: (NOTE) SARS-CoV-2 target nucleic acids are NOT DETECTED.  The SARS-CoV-2 RNA is generally detectable in upper respiratory specimens during the acute phase of infection. The lowest concentration of SARS-CoV-2 viral copies this assay can detect is 138 copies/mL. A negative result  does not preclude SARS-Cov-2 infection and should not be used as the sole basis for treatment or other patient management decisions. A negative result may occur with  improper specimen collection/handling, submission of specimen other than nasopharyngeal swab, presence of viral mutation(s) within the areas targeted by this assay, and inadequate number of viral copies(<138 copies/mL). A negative result must be combined with clinical observations, patient history, and epidemiological information. The expected result is Negative.  Fact Sheet for Patients:  EntrepreneurPulse.com.au  Fact Sheet for Healthcare Providers:  IncredibleEmployment.be  This test is no t yet approved or cleared by the [place] FDA and  has been authorized for detection and/or diagnosis of SARS-CoV-2  by FDA under an Emergency Use Authorization (EUA). This EUA will remain  in effect (meaning this test can be used) for the duration of the COVID-19 declaration under Section 564(b)(1) of the Act, 21 U.S.C.section 360bbb-3(b)(1), unless the authorization is terminated  or revoked sooner.       Influenza A by PCR NEGATIVE NEGATIVE Final   Influenza B by PCR NEGATIVE NEGATIVE Final    Comment: (NOTE) The Xpert Xpress SARS-CoV-2/FLU/RSV plus assay is intended as an aid in the diagnosis of influenza from Nasopharyngeal swab specimens and should not be used as a sole basis for treatment. Nasal washings and aspirates are unacceptable for Xpert Xpress SARS-CoV-2/FLU/RSV testing.  Fact Sheet for Patients: EntrepreneurPulse.com.au  Fact Sheet for Healthcare Providers: IncredibleEmployment.be  This test is not yet approved or cleared by the [place] FDA and has been authorized for detection and/or diagnosis of SARS-CoV-2 by FDA under an Emergency Use Authorization (EUA). This EUA will remain in effect (meaning this test can be used) for the duration of the COVID-19 declaration under Section 564(b)(1) of the Act, 21 U.S.C. section 360bbb-3(b)(1), unless the authorization is terminated or revoked.  Performed at [facility], [address], [place], [place] [address]   Culture, blood (routine x 2)     Status: Abnormal   Collection Time: 09/23/20  4:09 PM   Specimen: BLOOD RIGHT FOREARM  Result Value Ref Range Status   Specimen Description   Final    BLOOD RIGHT FOREARM Performed at [facility], [address], [place], [place] [address]    Special Requests   Final    Blood Culture results may not be optimal due to an inadequate volume of blood received in culture bottles BOTTLES DRAWN AEROBIC AND ANAEROBIC Performed at [facility], [address], [place], [place] [address]    Culture  Setup Time   Final    GRAM POSITIVE COCCI BOTH AEROBIC AND  ANAEROBIC BOTTLES Gram Stain Report Called to,Read Back By and Verified With: [facility] CAULDER  09/24/20 @ 1011 BY S BEARD CRITICAL RESULT CALLED TO, READ BACK BY AND VERIFIED WITH: Golden Pop RN 1700 09/24/20 A BROWNING Performed at [facility], [place] [address] [place], [place] [address]    Culture STAPHYLOCOCCUS AUREUS (A)  Final   Report Status 09/26/2020 FINAL  Final   Organism ID, Bacteria STAPHYLOCOCCUS AUREUS  Final      Susceptibility   Staphylococcus aureus - MIC*    CIPROFLOXACIN <=0.5 SENSITIVE Sensitive     ERYTHROMYCIN <=0.25 SENSITIVE Sensitive     GENTAMICIN <=0.5 SENSITIVE Sensitive     OXACILLIN <=0.25 SENSITIVE Sensitive     TETRACYCLINE <=1 SENSITIVE Sensitive     VANCOMYCIN 1 SENSITIVE Sensitive     TRIMETH/SULFA <=10 SENSITIVE Sensitive     CLINDAMYCIN <=0.25 SENSITIVE Sensitive  RIFAMPIN <=0.5 SENSITIVE Sensitive     Inducible Clindamycin NEGATIVE Sensitive     * STAPHYLOCOCCUS AUREUS  Culture, blood (routine x 2)     Status: None   Collection Time: 09/23/20  4:09 PM   Specimen: BLOOD  Result Value Ref Range Status   Specimen Description BLOOD LEFT ANTECUBITAL  Final   Special Requests   Final    Blood Culture adequate volume BOTTLES DRAWN AEROBIC AND ANAEROBIC   Culture   Final    NO GROWTH 5 DAYS Performed at [facility], [address] [place], [place] [address]    Report Status 09/28/2020 FINAL  Final  Blood Culture ID Panel (Reflexed)     Status: Abnormal   Collection Time: 09/23/20  4:09 PM  Result Value Ref Range Status   Enterococcus faecalis NOT DETECTED NOT DETECTED Final   Enterococcus Faecium NOT DETECTED NOT DETECTED Final   Listeria monocytogenes NOT DETECTED NOT DETECTED Final   Staphylococcus species DETECTED (A) NOT DETECTED Final    Comment: CRITICAL RESULT CALLED TO, READ BACK BY AND VERIFIED WITH: Golden Pop RN 1700 09/24/20 A BROWNING    Staphylococcus aureus (BCID) DETECTED (A) NOT DETECTED Final    Comment: CRITICAL RESULT CALLED  TO, READ BACK BY AND VERIFIED WITH: Golden Pop RN 1700 09/24/20 A BROWNING    Staphylococcus epidermidis NOT DETECTED NOT DETECTED Final   Staphylococcus lugdunensis NOT DETECTED NOT DETECTED Final   Streptococcus species NOT DETECTED NOT DETECTED Final   Streptococcus agalactiae NOT DETECTED NOT DETECTED Final   Streptococcus pneumoniae NOT DETECTED NOT DETECTED Final   Streptococcus pyogenes NOT DETECTED NOT DETECTED Final   A.calcoaceticus-baumannii NOT DETECTED NOT DETECTED Final   Bacteroides fragilis NOT DETECTED NOT DETECTED Final   Enterobacterales NOT DETECTED NOT DETECTED Final   Enterobacter cloacae complex NOT DETECTED NOT DETECTED Final   Escherichia coli NOT DETECTED NOT DETECTED Final   Klebsiella aerogenes NOT DETECTED NOT DETECTED Final   Klebsiella oxytoca NOT DETECTED NOT DETECTED Final   Klebsiella pneumoniae NOT DETECTED NOT DETECTED Final   Proteus species NOT DETECTED NOT DETECTED Final   Salmonella species NOT DETECTED NOT DETECTED Final   Serratia marcescens NOT DETECTED NOT DETECTED Final   Haemophilus influenzae NOT DETECTED NOT DETECTED Final   Neisseria meningitidis NOT DETECTED NOT DETECTED Final   Pseudomonas aeruginosa NOT DETECTED NOT DETECTED Final   Stenotrophomonas maltophilia NOT DETECTED NOT DETECTED Final   Candida albicans NOT DETECTED NOT DETECTED Final   Candida auris NOT DETECTED NOT DETECTED Final   Candida glabrata NOT DETECTED NOT DETECTED Final   Candida krusei NOT DETECTED NOT DETECTED Final   Candida parapsilosis NOT DETECTED NOT DETECTED Final   Candida tropicalis NOT DETECTED NOT DETECTED Final   Cryptococcus neoformans/gattii NOT DETECTED NOT DETECTED Final   Meth resistant mecA/C and MREJ NOT DETECTED NOT DETECTED Final    Comment: Performed at [facility], [address], [place], [place] [address]  MRSA PCR Screening     Status: None   Collection Time: 09/23/20  5:05 PM   Specimen: Nasopharyngeal  Result Value Ref Range  Status   MRSA by PCR NEGATIVE NEGATIVE Final    Comment:        The GeneXpert MRSA Assay (FDA approved for NASAL specimens only), is one component of a comprehensive MRSA colonization surveillance program. It is not intended to diagnose MRSA infection nor to guide or monitor treatment for MRSA infections. Performed at [facility], [address]  [place], [place] [address]   Urine culture     Status: None   Collection Time: 09/25/20  9:29 AM   Specimen: Urine, Clean Catch  Result Value Ref Range Status   Specimen Description   Final    URINE, CLEAN CATCH Performed at [facility], [address], [place], [place] [address]    Special Requests   Final    NONE Performed at [facility], [address], [place], [place] [address]    Culture   Final    NO GROWTH Performed at [facility], [place] [address], [place], [place] [address]    Report Status 09/26/2020 FINAL  Final  Culture, blood (routine x 2)     Status: None (Preliminary result)   Collection Time: 09/26/20  3:51 AM   Specimen: BLOOD RIGHT HAND  Result Value Ref Range Status   Specimen Description BLOOD RIGHT HAND  Final   Special Requests   Final    BOTTLES DRAWN AEROBIC AND ANAEROBIC Blood Culture adequate volume   Culture   Final    NO GROWTH 2 DAYS Performed at [facility], [address], [place], [place] [address]    Report Status PENDING  Incomplete  Culture, blood (routine x 2)     Status: None (Preliminary result)   Collection Time: 09/26/20  3:58 AM   Specimen: BLOOD LEFT HAND  Result Value Ref Range Status   Specimen Description BLOOD LEFT HAND  Final   Special Requests   Final    BOTTLES DRAWN AEROBIC AND ANAEROBIC Blood Culture adequate volume   Culture   Final    NO GROWTH 2 DAYS Performed at [facility], [address] [place], [place] [address]    Report Status PENDING  Incomplete     Radiology Studies: IR NEPHROSTOMY PLACEMENT LEFT  Result Date: 09/28/2020 INDICATION: 55 year old  female with a history of hydronephrosis secondary to cervical cancer, with the baseline imaging diagnosis 02/19/2017. The patient has had bilateral ureteral stents in the past as a treatment, now with persisting hydronephrosis in the setting of double-J ureteral stents. EXAM: IR NEPHROSTOMY PLACEMENT RIGHT; IR NEPHROSTOMY PLACEMENT LEFT COMPARISON:  CT 09/26/2020 MEDICATIONS: 2 g Rocephin IV ANESTHESIA/SEDATION: Fentanyl 100 mcg IV; Versed 3.0 mg IV Moderate Sedation Time:  30 minutes The patient was continuously monitored during the procedure by the interventional radiology nurse under my direct supervision. CONTRAST:  36mL OMNIPAQUE IOHEXOL 300 MG/ML SOLN - administered into the collecting system(s) FLUOROSCOPY TIME:  Fluoroscopy Time: 3 minutes 30 seconds (114 mGy). COMPLICATIONS: None PROCEDURE: Informed written consent was obtained from the patient after a thorough discussion of the procedural risks, benefits and alternatives. All questions were addressed. Maximal Sterile Barrier Technique was utilized including caps, mask, sterile gowns, sterile gloves, sterile drape, hand hygiene and skin antiseptic. A timeout was performed prior to the initiation of the procedure. Patient positioned prone position on the fluoroscopy table. Ultrasound survey of the bilateral flank was performed with images stored and sent to PACs. Left: The patient was then prepped and draped in the usual sterile fashion. 1% lidocaine was used to anesthetize the skin and subcutaneous tissues for local anesthesia. A Chiba needle was then used to access a posterior inferior calyx of the left kidney with ultrasound guidance. With spontaneous urine returned through the needle, passage of an 018 micro wire into the collecting system was performed under fluoroscopy. A small incision was made with an 11 blade scalpel, and the needle was removed from the wire. An  [place] system was then advanced over the wire into the collecting system under  fluoroscopy. The metal stiffener and inner dilator were removed, and then contrast confirmed location. Bentson wire was passed into the collecting system and the sheath removed. Ten French dilation of the soft tissues was performed. Using modified Seldinger technique, a 10 French pigtail catheter drain was placed over the Bentson wire. Wire and inner stiffener removed, and the pigtail was formed in the collecting system. Small amount of contrast confirmed position of the catheter. Right: 1% lidocaine was used to anesthetize the skin and subcutaneous tissues for local anesthesia. A Chiba needle was then used to access a posterior inferior calyx of the right kidney with ultrasound guidance. With spontaneous urine returned through the needle, passage of an 018 micro wire into the collecting system was performed under fluoroscopy. A small incision was made with an 11 blade scalpel, and the needle was removed from the wire. An [place] system was then advanced over the wire into the collecting system under fluoroscopy. The metal stiffener and inner dilator were removed. Contrast confirmed location. Bentson wire was passed into the collecting system and the sheath removed. Ten French dilation of the soft tissues was performed. Using modified Seldinger technique, a 10 French pigtail catheter drain was placed over the Bentson wire. Wire and inner stiffener removed, and the pigtail was formed in the collecting system. Small amount of contrast confirmed position of the catheter. Patient tolerated the procedure well and remained hemodynamically stable throughout. No complications were encountered and no significant blood loss encountered FINDINGS: Upon placement of the bilateral percutaneous nephrostomy, it is apparent that the patient has a relatively symmetric configuration of the kidney/collecting system, with bifurcated system. The lower pole moiety drains from the bilateral kidneys via a small short infundibulum into a  relatively small renal pelvis bilaterally. The upper pole collecting system of the bilateral kidneys is drained via the position of the indwelling bilateral double-J ureteral stent. IMPRESSION: Status post bilateral percutaneous nephrostomy, with placement of 10 French drains into the collecting system of the lower pole moiety of the kidneys, as above. The upper pole moiety of the bilateral kidneys are drained by the double-J ureteral stents and the lower pole moieties are drained via the percutaneous nephrostomy. Signed, Dulcy Fanny. Dellia Nims, RPVI [facility] Radiology Electronically Signed   By: Corrie Mckusick D.O.   On: 09/28/2020 15:11   IR NEPHROSTOMY PLACEMENT RIGHT  Result Date: 09/28/2020 INDICATION: 55 year old female with a history of hydronephrosis secondary to cervical cancer, with the baseline imaging diagnosis 02/19/2017. The patient has had bilateral ureteral stents in the past as a treatment, now with persisting hydronephrosis in the setting of double-J ureteral stents. EXAM: IR NEPHROSTOMY PLACEMENT RIGHT; IR NEPHROSTOMY PLACEMENT LEFT COMPARISON:  CT 09/26/2020 MEDICATIONS: 2 g Rocephin IV ANESTHESIA/SEDATION: Fentanyl 100 mcg IV; Versed 3.0 mg IV Moderate Sedation Time:  30 minutes The patient was continuously monitored during the procedure by the interventional radiology nurse under my direct supervision. CONTRAST:  58mL OMNIPAQUE IOHEXOL 300 MG/ML SOLN - administered into the collecting system(s) FLUOROSCOPY TIME:  Fluoroscopy Time: 3 minutes 30 seconds (114 mGy). COMPLICATIONS: None PROCEDURE: Informed written consent was obtained from the patient after a thorough discussion of the procedural risks, benefits and alternatives. All questions were addressed. Maximal Sterile Barrier Technique was utilized including caps, mask, sterile gowns, sterile gloves, sterile drape, hand hygiene and skin antiseptic. A timeout was performed prior to the  initiation of the procedure.  Patient positioned prone position on the fluoroscopy table. Ultrasound survey of the bilateral flank was performed with images stored and sent to PACs. Left: The patient was then prepped and draped in the usual sterile fashion. 1% lidocaine was used to anesthetize the skin and subcutaneous tissues for local anesthesia. A Chiba needle was then used to access a posterior inferior calyx of the left kidney with ultrasound guidance. With spontaneous urine returned through the needle, passage of an 018 micro wire into the collecting system was performed under fluoroscopy. A small incision was made with an 11 blade scalpel, and the needle was removed from the wire. An [place] system was then advanced over the wire into the collecting system under fluoroscopy. The metal stiffener and inner dilator were removed, and then contrast confirmed location. Bentson wire was passed into the collecting system and the sheath removed. Ten French dilation of the soft tissues was performed. Using modified Seldinger technique, a 10 French pigtail catheter drain was placed over the Bentson wire. Wire and inner stiffener removed, and the pigtail was formed in the collecting system. Small amount of contrast confirmed position of the catheter. Right: 1% lidocaine was used to anesthetize the skin and subcutaneous tissues for local anesthesia. A Chiba needle was then used to access a posterior inferior calyx of the right kidney with ultrasound guidance. With spontaneous urine returned through the needle, passage of an 018 micro wire into the collecting system was performed under fluoroscopy. A small incision was made with an 11 blade scalpel, and the needle was removed from the wire. An [place] system was then advanced over the wire into the collecting system under fluoroscopy. The metal stiffener and inner dilator were removed. Contrast confirmed location. Bentson wire was passed into the collecting system and  the sheath removed. Ten French dilation of the soft tissues was performed. Using modified Seldinger technique, a 10 French pigtail catheter drain was placed over the Bentson wire. Wire and inner stiffener removed, and the pigtail was formed in the collecting system. Small amount of contrast confirmed position of the catheter. Patient tolerated the procedure well and remained hemodynamically stable throughout. No complications were encountered and no significant blood loss encountered FINDINGS: Upon placement of the bilateral percutaneous nephrostomy, it is apparent that the patient has a relatively symmetric configuration of the kidney/collecting system, with bifurcated system. The lower pole moiety drains from the bilateral kidneys via a small short infundibulum into a relatively small renal pelvis bilaterally. The upper pole collecting system of the bilateral kidneys is drained via the position of the indwelling bilateral double-J ureteral stent. IMPRESSION: Status post bilateral percutaneous nephrostomy, with placement of 10 French drains into the collecting system of the lower pole moiety of the kidneys, as above. The upper pole moiety of the bilateral kidneys are drained by the double-J ureteral stents and the lower pole moieties are drained via the percutaneous nephrostomy. Signed, Dulcy Fanny. Dellia Nims, RPVI [facility] Radiology Electronically Signed   By: Corrie Mckusick D.O.   On: 09/28/2020 15:11    Scheduled Meds: . calcium acetate  1,334 mg Oral TID WC  . Chlorhexidine Gluconate Cloth  6 each Topical Q0600  . FLUoxetine  10 mg Oral Daily  . heparin  5,000 Units Subcutaneous Q8H  . levothyroxine  175 mcg Oral Daily  . oxyCODONE  10 mg Oral Q12H  . polyethylene glycol  17 g Oral Daily  . senna  1 tablet Oral QHS  . sodium chloride  flush  3 mL Intravenous Q12H  . temazepam  15 mg Oral Once   Continuous Infusions: . sodium chloride 250 mL  (09/27/20 1858)  . sodium chloride 20 mL/hr at 09/28/20 1458  .  ceFAZolin (ANCEF) IV 2 g (09/28/20 1350)  . cefTRIAXone (ROCEPHIN)  IV       LOS: 5 days    Time spent: 35 mins    Barton Dubois, MD [facility]   If 7PM-7AM, please contact night-coverage [email or web address]  09/28/2020, 4:50 PM

## 2020-09-28 NOTE — Procedures (Signed)
Interventional Radiology Procedure Note  Procedure: Image guided bilateral PCN. 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  The bilateral ureteral stents are draining the upper moieties. The new PCN's are draining the lower moieties. .  Complications: None  EBL: None  Recommendations: - Routine drain care, observe for clearing of urine - If the upper moieties remain dilated, would suggest discussing upsize of the ureteral drains with Urology, as the current PCN's only drain the lower moieties - routine exchanges of PCN may commence in ~6 weeks - routine wound care  Signed,  Dulcy Fanny. Earleen Newport, DO

## 2020-09-28 NOTE — Progress Notes (Signed)
Pharmacy Antibiotic Note  Tanya Wright is a 55 y.o. female admitted on 09/23/2020 with MSSA bacteremia.  Pharmacy has been consulted for Cefazolin dosing. CrCl 24mls/min, adjust cefazolin. 2DECHO is negative for vegetations. F/U TEE  Plan: Increase Cefazolin 2000 mg IV every 8 hours. Monitor labs, c/s, and patient improvement.  Height: 5\' 11"  (180.3 cm) Weight: 91 kg (200 lb 9.9 oz) IBW/kg (Calculated) : 70.8  Temp (24hrs), Avg:99.3 F (37.4 C), Min:98.9 F (37.2 C), Max:100 F (37.8 C)  Recent Labs  Lab 09/23/20 1609 09/23/20 2226 09/24/20 0510 09/25/20 0521 09/26/20 0351 09/27/20 0439 09/28/20 0425  WBC 11.2*  --  9.1 6.7 5.4 4.8 4.6  CREATININE 3.00*  --  3.00* 2.96* 2.84* 2.85* 2.45*  LATICACIDVEN 1.1 1.0  --   --   --   --   --     Estimated Creatinine Clearance: 31.9 mL/min (A) (by C-G formula based on SCr of 2.45 mg/dL (H)).    Allergies  Allergen Reactions  . Oxycodone Itching    Antimicrobials this admission: CTX 6/2 >>6/4 Cefazolin 6/4 >>   Microbiology results: 6/5 Bcx: ngtd 6/2 Bcx:  MSSA 2/4 bottles 6/2 Ucx: ng 6/2 MRSA PCR neg  Thank you for allowing pharmacy to be a part of this patient's care.  Cristy Friedlander 09/28/2020 7:44 AM

## 2020-09-29 ENCOUNTER — Encounter (HOSPITAL_COMMUNITY): Payer: Self-pay | Admitting: Family Medicine

## 2020-09-29 ENCOUNTER — Other Ambulatory Visit (HOSPITAL_COMMUNITY): Payer: Self-pay | Admitting: *Deleted

## 2020-09-29 ENCOUNTER — Inpatient Hospital Stay (HOSPITAL_COMMUNITY): Payer: 59 | Admitting: Certified Registered"

## 2020-09-29 ENCOUNTER — Inpatient Hospital Stay (HOSPITAL_COMMUNITY): Payer: 59

## 2020-09-29 ENCOUNTER — Encounter (HOSPITAL_COMMUNITY)
Admission: EM | Disposition: A | Discharge status: Skilled Nursing Facility | Payer: Self-pay | Source: Home / Self Care | Attending: Internal Medicine

## 2020-09-29 DIAGNOSIS — R7881 Bacteremia: Secondary | ICD-10-CM

## 2020-09-29 DIAGNOSIS — I340 Nonrheumatic mitral (valve) insufficiency: Secondary | ICD-10-CM

## 2020-09-29 HISTORY — PX: TEE WITHOUT CARDIOVERSION: SHX5443

## 2020-09-29 LAB — GLUCOSE, CAPILLARY
Glucose-Capillary: 93 mg/dL (ref 70–99)
Glucose-Capillary: 100 mg/dL — ABNORMAL HIGH (ref 70–99)

## 2020-09-29 SURGERY — ECHOCARDIOGRAM, TRANSESOPHAGEAL
Anesthesia: General

## 2020-09-29 MED ORDER — METOCLOPRAMIDE HCL 10 MG/10ML PO SOLN: 10.0000 mg | Freq: Three times a day (TID) | ORAL | Status: DC

## 2020-09-29 MED ORDER — ORAL CARE MOUTH RINSE: 15.0000 mL | Freq: Once | OROMUCOSAL | Status: DC

## 2020-09-29 MED ORDER — LACTATED RINGERS IV SOLN: INTRAVENOUS | Status: DC

## 2020-09-29 MED ORDER — METOCLOPRAMIDE HCL 5 MG/ML IJ SOLN
INTRAMUSCULAR | Status: AC
  Filled 2020-09-29: qty 2

## 2020-09-29 MED ORDER — CHLORHEXIDINE GLUCONATE 0.12 % MT SOLN: 15.0000 mL | Freq: Once | OROMUCOSAL | Status: DC

## 2020-09-29 MED ORDER — PHENYLEPHRINE 40 MCG/ML (10ML) SYRINGE FOR IV PUSH (FOR BLOOD PRESSURE SUPPORT)
PREFILLED_SYRINGE | INTRAVENOUS | Status: AC
  Filled 2020-09-29: qty 10

## 2020-09-29 MED ORDER — METOCLOPRAMIDE HCL 5 MG/ML IJ SOLN
10.0000 mg | Freq: Once | INTRAMUSCULAR | Status: AC
  Administered 2020-09-29: 10 mg via INTRAVENOUS

## 2020-09-29 MED ORDER — PROPOFOL 10 MG/ML IV BOLUS
INTRAVENOUS | Status: DC | PRN
  Administered 2020-09-29: 20 mg via INTRAVENOUS
  Administered 2020-09-29: 40 mg via INTRAVENOUS
  Administered 2020-09-29: 70 mg via INTRAVENOUS
  Administered 2020-09-29: 30 mg via INTRAVENOUS
  Administered 2020-09-29 (×2): 20 mg via INTRAVENOUS

## 2020-09-29 MED ORDER — PROPOFOL 10 MG/ML IV BOLUS
INTRAVENOUS | Status: AC
  Filled 2020-09-29: qty 40

## 2020-09-29 MED ORDER — PROPOFOL 10 MG/ML IV BOLUS
INTRAVENOUS | Status: AC
  Filled 2020-09-29: qty 60

## 2020-09-29 MED ORDER — EPHEDRINE 5 MG/ML INJ
INTRAVENOUS | Status: AC
  Filled 2020-09-29: qty 10

## 2020-09-29 NOTE — Anesthesia Postprocedure Evaluation (Signed)
Anesthesia Post Note  Patient: Tanya Wright  Procedure(s) Performed: TRANSESOPHAGEAL ECHOCARDIOGRAM (TEE) WITH PROPOFOL (N/A )  Patient location during evaluation: PACU Anesthesia Type: General Level of consciousness: awake and alert and oriented Pain management: pain level controlled Vital Signs Assessment: post-procedure vital signs reviewed and stable Respiratory status: spontaneous breathing and respiratory function stable Cardiovascular status: stable and blood pressure returned to baseline Postop Assessment: no apparent nausea or vomiting Anesthetic complications: no   No complications documented.   Last Vitals:  Vitals:   09/29/20 1224 09/29/20 1233  BP: 103/86 99/80  Pulse: 63 66  Resp: 15   Temp:    SpO2: 97% 96%    Last Pain:  Vitals:   09/29/20 1233  TempSrc:   PainSc: 0-No pain                 Gemayel Mascio C Sabrinia Prien

## 2020-09-29 NOTE — Progress Notes (Signed)
*  PRELIMINARY RESULTS* Echocardiogram 2D Echocardiogram has been performed.  Tanya Wright 09/29/2020, 12:53 PM

## 2020-09-29 NOTE — Interval H&P Note (Signed)
History and Physical Interval Note:  09/29/2020 11:51 AM  Tanya Wright  has presented today for surgery, with the diagnosis of bacteremia.  The various methods of treatment have been discussed with the patient and family. After consideration of risks, benefits and other options for treatment, the patient has consented to  Procedure(s): TRANSESOPHAGEAL ECHOCARDIOGRAM (TEE) WITH PROPOFOL (N/A) as a surgical intervention.  The patient's history has been reviewed, patient examined, no change in status, stable for surgery.  I have reviewed the patient's chart and labs.  Questions were answered to the patient's satisfaction.     Dorris Carnes

## 2020-09-29 NOTE — Progress Notes (Signed)
PROGRESS NOTE    Tanya Wright  OIN:[id number] DOB: 22-Aug-1965 DOA: 09/23/2020 PCP: System, Provider Not In    Brief Narrative:  55 year old female with a history of cervical cancer status post radiation therapy and bilateral distal ureteral obstruction managed with bilateral ureteral stents.  She is followed at [facility] urology and oncology.  Last stent exchange was 05/2020.  She is admitted to the hospital with sepsis secondary to urinary source.  She is also noted to have acute kidney injury and bilateral hydronephrosis.  Urology following.  Foley catheter placed for bladder decompression.  May need to consider percutaneous nephrostomies if renal function does not improve   Assessment & Plan:   Principal Problem:   UTI (urinary tract infection) Active Problems:   Uterine cervix cancer (HCC)   CKD (chronic kidney disease) stage 3, GFR 30-59 ml/min (HCC)   Anemia in other chronic diseases classified elsewhere   AKI (acute kidney injury) ([place])   Sepsis ([place])   Bacteremia   Bilateral hydronephrosis   Sepsis due to MSSA bacteremia and urinary tract infection -Patient noted to be febrile, tachycardic, with source of infection being urine and evidence of endorgan damage with acute kidney injury -Initially on Rocephin -She did have 1 out of 2 blood cultures positive for MSSA - Discussed with Dr. Juleen China and antibiotics changed to Ancef -repeat cultures sent 09/26/2020; no growth so far. -2D echo ordered, and demonstrated no vegetations.  Cardiology service has been consulted for TEE.  Planning TEE later today -she does have a port for chemotherapy, pending final decision for removal by IR and infectious disease.. -Hemodynamics currently stable -Patient experienced once again low-grade temperature overnight. -Continue to follow final repeat blood cultures; so far no growth after 3 days.  Acute kidney injury on chronic kidney disease stage IIIa -Bilateral hydronephrosis with presence of double-J  stents -Seen by urology with recommendations for continued IV hydration through the weekend and decompression of bladder with Foley catheter. -Status post percutaneous nephrostomy tube placement by IR on 09/28/2020; decision was made not to remove Port-A-Cath as indicated by infectious disease service as a catheter was no appearing to be infected currently. -Continue to follow any further urology service recommendations. -Baseline creatinine around 1.97 -Creatinine on admission noted to be 3.1 -We will repeat basic metabolic panel in the morning to follow renal function trend.. Some Increased in urine output also appreciated.   -Repeat CT scan demonstrated persistent hydronephrosis and IR has been consulted for percutaneous nephrostomy tubes; procedure Elevated today.  Stage IV cervical cancer -She is followed at [facility] follow-up as an outpatient  Anemia of chronic disease -No signs of bleeding -Continue to follow hemoglobin trend.  Hypothyroidism -Continue home dose Synthroid  Chronic pain syndrome -Continued home regimen of oxycodone and OxyContin  Depression -Continue on Prozac   DVT prophylaxis: heparin injection 5,000 Units Start: 09/23/20 1430 TED hose Start: 09/23/20 1416 SCDs Start: 09/23/20 1416  Code Status: Full code Family Communication: Discussed with patient Disposition Plan: Status is: Inpatient  Dispo: The patient is from: Home              Anticipated d/c is to: Home              Patient currently is not medically stable to d/c.   Difficult to place patient no    Consultants:   Urology  Infectious Disease (phone)  Cardiology service  IR  Procedures:   Percutaneous nephrostomy tube placed 09/28/2020  Planning for TEE 09/29/2020  Antimicrobials:   Ceftriaxone 6/2 >6/4  Ancef 6/4>   Subjective: Low-grade temperature overnight appreciated; intermittent flank pain reported.  No nausea, no vomiting, no palpitations, no chest  pain or shortness of breath.  Objective: Vitals:   09/29/20 1233 09/29/20 1245 09/29/20 1300 09/29/20 1319  BP: 99/80 127/72 124/77 115/78  Pulse: 66 64 70 71  Resp:  15 12 18   Temp:      TempSrc:      SpO2: 96% 97% 96% 100%  Weight:      Height:        Intake/Output Summary (Last 24 hours) at 09/29/2020 1622 Last data filed at 09/29/2020 1500 Gross per 24 hour  Intake 930.6 ml  Output 3420 ml  Net -2489.4 ml   Filed Weights   09/27/20 0500 09/28/20 0500 09/29/20 0500  Weight: 91 kg 91 kg 89 kg    Examination: General exam: Alert, awake, oriented x 3; low-grade temperature overnight.  No chest pain, no nausea, no vomiting.  Intermittent flank pain has been reported.  Increasing urine output appreciated. Respiratory system: Clear to auscultation. Respiratory effort normal.  No requiring oxygen supplementation. Cardiovascular system:RRR. No murmurs, rubs, gallops.  No JVD. Gastrointestinal system: Abdomen is nondistended, soft and nontender. No organomegaly or masses felt. Normal bowel sounds heard.  Percutaneous nephrostomy tube in place. Central nervous system: Alert and oriented. No focal neurological deficits. Extremities: No cyanosis or clubbing; no edema. Skin: No petechiae. Psychiatry: Judgement and insight appear normal. Mood & affect appropriate.    Data Reviewed: I have personally reviewed following labs and imaging studies  CBC: Recent Labs  Lab 09/23/20 1609 09/24/20 0510 09/25/20 0521 09/26/20 0351 09/27/20 0439 09/28/20 0425  WBC 11.2* 9.1 6.7 5.4 4.8 4.6  NEUTROABS 10.1*  --   --   --   --   --   HGB 11.0* 10.5* 10.0* 9.3* 9.4* 9.2*  HCT 34.2* 32.2* 30.8* 29.1* 29.6* 27.7*  MCV 97.4 97.3 96.3 96.7 99.0 95.2  PLT 155 143* 134* 133* 185 094   Basic Metabolic Panel: Recent Labs  Lab 09/24/20 0510 09/25/20 0521 09/26/20 0351 09/27/20 0439 09/28/20 0425  NA 132* 133* 134* 139 137  K 4.0 3.6 3.7 4.2 4.2  CL 100 102 104 108 107  CO2 24 22 23 24 24    GLUCOSE 102* 100* 106* 110* 94  BUN 39* 39* 33* 28* 24*  CREATININE 3.00* 2.96* 2.84* 2.85* 2.45*  CALCIUM 7.6* 7.2* 7.0* 7.5* 7.6*   GFR: Estimated Creatinine Clearance: 31.6 mL/min (A) (by C-G formula based on SCr of 2.45 mg/dL (H)).   Liver Function Tests: Recent Labs  Lab 09/23/20 1609  AST 26  ALT 23  ALKPHOS 67  BILITOT 1.1  PROT 6.6  ALBUMIN 2.8*   Coagulation Profile: Recent Labs  Lab 09/23/20 1609 09/24/20 0510  INR 1.2 1.2   CBG: Recent Labs  Lab 09/24/20 0811 09/25/20 0746 09/27/20 0723 09/28/20 0732 09/29/20 0716  GLUCAP 99 97 124* 93 100*   Sepsis Labs: Recent Labs  Lab 09/23/20 1609 09/23/20 2226  LATICACIDVEN 1.1 1.0    Recent Results (from the past 240 hour(s))  Resp Panel by RT-PCR (Flu A&B, Covid) Nasopharyngeal Swab     Status: None   Collection Time: 09/23/20 11:23 AM   Specimen: Nasopharyngeal Swab; Nasopharyngeal(NP) swabs in vial transport medium  Result Value Ref Range Status   SARS Coronavirus 2 by RT PCR NEGATIVE NEGATIVE Final    Comment: (NOTE) SARS-CoV-2 target nucleic acids are NOT  DETECTED.  The SARS-CoV-2 RNA is generally detectable in upper respiratory specimens during the acute phase of infection. The lowest concentration of SARS-CoV-2 viral copies this assay can detect is 138 copies/mL. A negative result does not preclude SARS-Cov-2 infection and should not be used as the sole basis for treatment or other patient management decisions. A negative result may occur with  improper specimen collection/handling, submission of specimen other than nasopharyngeal swab, presence of viral mutation(s) within the areas targeted by this assay, and inadequate number of viral copies(<138 copies/mL). A negative result must be combined with clinical observations, patient history, and epidemiological information. The expected result is Negative.  Fact Sheet for Patients:  EntrepreneurPulse.com.au  Fact Sheet for  Healthcare Providers:  IncredibleEmployment.be  This test is no t yet approved or cleared by the [place] FDA and  has been authorized for detection and/or diagnosis of SARS-CoV-2 by FDA under an Emergency Use Authorization (EUA). This EUA will remain  in effect (meaning this test can be used) for the duration of the COVID-19 declaration under Section 564(b)(1) of the Act, 21 U.S.C.section 360bbb-3(b)(1), unless the authorization is terminated  or revoked sooner.       Influenza A by PCR NEGATIVE NEGATIVE Final   Influenza B by PCR NEGATIVE NEGATIVE Final    Comment: (NOTE) The Xpert Xpress SARS-CoV-2/FLU/RSV plus assay is intended as an aid in the diagnosis of influenza from Nasopharyngeal swab specimens and should not be used as a sole basis for treatment. Nasal washings and aspirates are unacceptable for Xpert Xpress SARS-CoV-2/FLU/RSV testing.  Fact Sheet for Patients: EntrepreneurPulse.com.au  Fact Sheet for Healthcare Providers: IncredibleEmployment.be  This test is not yet approved or cleared by the [place] FDA and has been authorized for detection and/or diagnosis of SARS-CoV-2 by FDA under an Emergency Use Authorization (EUA). This EUA will remain in effect (meaning this test can be used) for the duration of the COVID-19 declaration under Section 564(b)(1) of the Act, 21 U.S.C. section 360bbb-3(b)(1), unless the authorization is terminated or revoked.  Performed at [facility], [address], [place], [place] [address]   Culture, blood (routine x 2)     Status: Abnormal   Collection Time: 09/23/20  4:09 PM   Specimen: BLOOD RIGHT FOREARM  Result Value Ref Range Status   Specimen Description   Final    BLOOD RIGHT FOREARM Performed at [facility], [address] [place], [place] [address]    Special Requests   Final    Blood Culture results may not be optimal due to an inadequate volume of blood  received in culture bottles BOTTLES DRAWN AEROBIC AND ANAEROBIC Performed at [facility], [address], [place], [place] [address]    Culture  Setup Time   Final    GRAM POSITIVE COCCI BOTH AEROBIC AND ANAEROBIC BOTTLES Gram Stain Report Called to,Read Back By and Verified With: [facility] CAULDER  09/24/20 @ 1011 BY S BEARD CRITICAL RESULT CALLED TO, READ BACK BY AND VERIFIED WITH: Golden Pop RN 1700 09/24/20 A BROWNING Performed at [facility], [place] [address], [place], [place] [address]    Culture STAPHYLOCOCCUS AUREUS (A)  Final   Report Status 09/26/2020 FINAL  Final   Organism ID, Bacteria STAPHYLOCOCCUS AUREUS  Final      Susceptibility   Staphylococcus aureus - MIC*    CIPROFLOXACIN <=0.5 SENSITIVE Sensitive     ERYTHROMYCIN <=0.25 SENSITIVE Sensitive     GENTAMICIN <=0.5 SENSITIVE Sensitive     OXACILLIN <=0.25  SENSITIVE Sensitive     TETRACYCLINE <=1 SENSITIVE Sensitive     VANCOMYCIN 1 SENSITIVE Sensitive     TRIMETH/SULFA <=10 SENSITIVE Sensitive     CLINDAMYCIN <=0.25 SENSITIVE Sensitive     RIFAMPIN <=0.5 SENSITIVE Sensitive     Inducible Clindamycin NEGATIVE Sensitive     * STAPHYLOCOCCUS AUREUS  Culture, blood (routine x 2)     Status: None   Collection Time: 09/23/20  4:09 PM   Specimen: BLOOD  Result Value Ref Range Status   Specimen Description BLOOD LEFT ANTECUBITAL  Final   Special Requests   Final    Blood Culture adequate volume BOTTLES DRAWN AEROBIC AND ANAEROBIC   Culture   Final    NO GROWTH 5 DAYS Performed at [facility], [address], [place], [place] [address]    Report Status 09/28/2020 FINAL  Final  Blood Culture ID Panel (Reflexed)     Status: Abnormal   Collection Time: 09/23/20  4:09 PM  Result Value Ref Range Status   Enterococcus faecalis NOT DETECTED NOT DETECTED Final   Enterococcus Faecium NOT DETECTED NOT DETECTED Final   Listeria monocytogenes NOT DETECTED NOT DETECTED Final   Staphylococcus species DETECTED (A) NOT DETECTED Final     Comment: CRITICAL RESULT CALLED TO, READ BACK BY AND VERIFIED WITH: Golden Pop RN 1700 09/24/20 A BROWNING    Staphylococcus aureus (BCID) DETECTED (A) NOT DETECTED Final    Comment: CRITICAL RESULT CALLED TO, READ BACK BY AND VERIFIED WITH: Golden Pop RN 1700 09/24/20 A BROWNING    Staphylococcus epidermidis NOT DETECTED NOT DETECTED Final   Staphylococcus lugdunensis NOT DETECTED NOT DETECTED Final   Streptococcus species NOT DETECTED NOT DETECTED Final   Streptococcus agalactiae NOT DETECTED NOT DETECTED Final   Streptococcus pneumoniae NOT DETECTED NOT DETECTED Final   Streptococcus pyogenes NOT DETECTED NOT DETECTED Final   A.calcoaceticus-baumannii NOT DETECTED NOT DETECTED Final   Bacteroides fragilis NOT DETECTED NOT DETECTED Final   Enterobacterales NOT DETECTED NOT DETECTED Final   Enterobacter cloacae complex NOT DETECTED NOT DETECTED Final   Escherichia coli NOT DETECTED NOT DETECTED Final   Klebsiella aerogenes NOT DETECTED NOT DETECTED Final   Klebsiella oxytoca NOT DETECTED NOT DETECTED Final   Klebsiella pneumoniae NOT DETECTED NOT DETECTED Final   Proteus species NOT DETECTED NOT DETECTED Final   Salmonella species NOT DETECTED NOT DETECTED Final   Serratia marcescens NOT DETECTED NOT DETECTED Final   Haemophilus influenzae NOT DETECTED NOT DETECTED Final   Neisseria meningitidis NOT DETECTED NOT DETECTED Final   Pseudomonas aeruginosa NOT DETECTED NOT DETECTED Final   Stenotrophomonas maltophilia NOT DETECTED NOT DETECTED Final   Candida albicans NOT DETECTED NOT DETECTED Final   Candida auris NOT DETECTED NOT DETECTED Final   Candida glabrata NOT DETECTED NOT DETECTED Final   Candida krusei NOT DETECTED NOT DETECTED Final   Candida parapsilosis NOT DETECTED NOT DETECTED Final   Candida tropicalis NOT DETECTED NOT DETECTED Final   Cryptococcus neoformans/gattii NOT DETECTED NOT DETECTED Final   Meth resistant mecA/C and MREJ NOT DETECTED NOT DETECTED Final    Comment:  Performed at [facility], [address] [place], [place] [address]  MRSA PCR Screening     Status: None   Collection Time: 09/23/20  5:05 PM   Specimen: Nasopharyngeal  Result Value Ref Range Status   MRSA by PCR NEGATIVE NEGATIVE Final    Comment:        The GeneXpert MRSA Assay (FDA approved for  NASAL specimens only), is one component of a comprehensive MRSA colonization surveillance program. It is not intended to diagnose MRSA infection nor to guide or monitor treatment for MRSA infections. Performed at [facility], [address] [place], [place] [address]   Urine culture     Status: None   Collection Time: 09/25/20  9:29 AM   Specimen: Urine, Clean Catch  Result Value Ref Range Status   Specimen Description   Final    URINE, CLEAN CATCH Performed at [facility], [address], [place], [place] [address]    Special Requests   Final    NONE Performed at [facility], [address] [place], [place] [address]    Culture   Final    NO GROWTH Performed at [facility], [place] [address] [place], [place] [address]    Report Status 09/26/2020 FINAL  Final  Culture, blood (routine x 2)     Status: None (Preliminary result)   Collection Time: 09/26/20  3:51 AM   Specimen: BLOOD RIGHT HAND  Result Value Ref Range Status   Specimen Description BLOOD RIGHT HAND  Final   Special Requests   Final    BOTTLES DRAWN AEROBIC AND ANAEROBIC Blood Culture adequate volume   Culture   Final    NO GROWTH 3 DAYS Performed at [facility], [address] [place], [place] [address]    Report Status PENDING  Incomplete  Culture, blood (routine x 2)     Status: None (Preliminary result)   Collection Time: 09/26/20  3:58 AM   Specimen: BLOOD LEFT HAND  Result Value Ref Range Status   Specimen Description BLOOD LEFT HAND  Final   Special Requests   Final    BOTTLES DRAWN AEROBIC AND ANAEROBIC Blood Culture adequate volume   Culture   Final    NO GROWTH 3 DAYS Performed at  [facility], [address], [place],  [address]    Report Status PENDING  Incomplete     Radiology Studies: ECHO TEE  Result Date: 09/29/2020    TRANSESOPHOGEAL ECHO REPORT   Patient Name:   Tanya Wright Date of Exam: 09/29/2020 Medical Rec #:  [id number]    Height:       71.0 in Accession #:    [id number]   Weight:       196.2 lb Date of Birth:  October 14, 1965    BSA:          2.091 m Patient Age:    59 years     BP:           111/82 mmHg Patient Gender: F            HR:           80 bpm. Exam Location:  [facility] Procedure: Transesophageal Echo, Cardiac Doppler and Color Doppler Indications:    Bacteremia R78.81  History:        Patient has prior history of Echocardiogram examinations, most                 recent 09/26/2020. Risk Factors:Hypertension. CKD (chronic kidney                 disease) Thyroid mass of unclear etiology. Uterine cervix cancer                 Patient.  Sonographer:    Alvino Chapel RCS Referring Phys: [id number] SCOTT T WEAVER PROCEDURE: The transesophogeal probe was passed without difficulty through  the esophogus of the patient. Sedation performed by different physician. The patient developed no complications during the procedure. IMPRESSIONS  1. No evidence of endocarditis.  2. Left ventricular ejection fraction, by estimation, is 60 to 65%. The left ventricle has normal function.  3. Right ventricular systolic function is normal. The right ventricular size is normal.  4. No left atrial/left atrial appendage thrombus was detected.  5. The mitral valve is normal in structure. Mild mitral valve regurgitation.  6. The aortic valve is normal in structure. Aortic valve regurgitation is trivial.  7. No shunt by color doppler. FINDINGS  Left Ventricle: Left ventricular ejection fraction, by estimation, is 60 to 65%. The left ventricle has normal function. The left ventricular internal cavity size was normal in size. Right Ventricle: The right ventricular size is normal. Right vetricular wall  thickness was not assessed. Right ventricular systolic function is normal. Left Atrium: Left atrial size was normal in size. No left atrial/left atrial appendage thrombus was detected. Right Atrium: Right atrial size was normal in size. Pericardium: There is no evidence of pericardial effusion. Mitral Valve: The mitral valve is normal in structure. Mild mitral valve regurgitation. Tricuspid Valve: The tricuspid valve is normal in structure. Tricuspid valve regurgitation is mild. Aortic Valve: The aortic valve is normal in structure. Aortic valve regurgitation is trivial. Pulmonic Valve: The pulmonic valve was grossly normal. Pulmonic valve regurgitation is not visualized. Aorta: The aortic root and ascending aorta are structurally normal, with no evidence of dilitation. IAS/Shunts: No shunt by color doppler. Dorris Carnes MD Electronically signed by Dorris Carnes MD Signature Date/Time: 09/29/2020/4:19:36 PM    Final    IR NEPHROSTOMY PLACEMENT LEFT  Result Date: 09/28/2020 INDICATION: 55 year old female with a history of hydronephrosis secondary to cervical cancer, with the baseline imaging diagnosis 02/19/2017. The patient has had bilateral ureteral stents in the past as a treatment, now with persisting hydronephrosis in the setting of double-J ureteral stents. EXAM: IR NEPHROSTOMY PLACEMENT RIGHT; IR NEPHROSTOMY PLACEMENT LEFT COMPARISON:  CT 09/26/2020 MEDICATIONS: 2 g Rocephin IV ANESTHESIA/SEDATION: Fentanyl 100 mcg IV; Versed 3.0 mg IV Moderate Sedation Time:  30 minutes The patient was continuously monitored during the procedure by the interventional radiology nurse under my direct supervision. CONTRAST:  25mL OMNIPAQUE IOHEXOL 300 MG/ML SOLN - administered into the collecting system(s) FLUOROSCOPY TIME:  Fluoroscopy Time: 3 minutes 30 seconds (114 mGy). COMPLICATIONS: None PROCEDURE: Informed written consent was obtained from the patient after a thorough discussion of the procedural risks, benefits and  alternatives. All questions were addressed. Maximal Sterile Barrier Technique was utilized including caps, mask, sterile gowns, sterile gloves, sterile drape, hand hygiene and skin antiseptic. A timeout was performed prior to the initiation of the procedure. Patient positioned prone position on the fluoroscopy table. Ultrasound survey of the bilateral flank was performed with images stored and sent to PACs. Left: The patient was then prepped and draped in the usual sterile fashion. 1% lidocaine was used to anesthetize the skin and subcutaneous tissues for local anesthesia. A Chiba needle was then used to access a posterior inferior calyx of the left kidney with ultrasound guidance. With spontaneous urine returned through the needle, passage of an 018 micro wire into the collecting system was performed under fluoroscopy. A small incision was made with an 11 blade scalpel, and the needle was removed from the wire. An Gaylord system was then advanced over the wire into the collecting system under fluoroscopy. The metal stiffener and inner dilator were removed, and then contrast  confirmed location. Bentson wire was passed into the collecting system and the sheath removed. Ten French dilation of the soft tissues was performed. Using modified Seldinger technique, a 10 French pigtail catheter drain was placed over the Bentson wire. Wire and inner stiffener removed, and the pigtail was formed in the collecting system. Small amount of contrast confirmed position of the catheter. Right: 1% lidocaine was used to anesthetize the skin and subcutaneous tissues for local anesthesia. A Chiba needle was then used to access a posterior inferior calyx of the right kidney with ultrasound guidance. With spontaneous urine returned through the needle, passage of an 018 micro wire into the collecting system was performed under fluoroscopy. A small incision was made with an 11 blade scalpel, and the needle was removed from the wire. An  Holly system was then advanced over the wire into the collecting system under fluoroscopy. The metal stiffener and inner dilator were removed. Contrast confirmed location. Bentson wire was passed into the collecting system and the sheath removed. Ten French dilation of the soft tissues was performed. Using modified Seldinger technique, a 10 French pigtail catheter drain was placed over the Bentson wire. Wire and inner stiffener removed, and the pigtail was formed in the collecting system. Small amount of contrast confirmed position of the catheter. Patient tolerated the procedure well and remained hemodynamically stable throughout. No complications were encountered and no significant blood loss encountered FINDINGS: Upon placement of the bilateral percutaneous nephrostomy, it is apparent that the patient has a relatively symmetric configuration of the kidney/collecting system, with bifurcated system. The lower pole moiety drains from the bilateral kidneys via a small short infundibulum into a relatively small renal pelvis bilaterally. The upper pole collecting system of the bilateral kidneys is drained via the position of the indwelling bilateral double-J ureteral stent. IMPRESSION: Status post bilateral percutaneous nephrostomy, with placement of 10 French drains into the collecting system of the lower pole moiety of the kidneys, as above. The upper pole moiety of the bilateral kidneys are drained by the double-J ureteral stents and the lower pole moieties are drained via the percutaneous nephrostomy. Signed, Dulcy Fanny. Dellia Nims, RPVI [facility] Electronically Signed   By: Corrie Mckusick D.O.   On: 09/28/2020 15:11   IR NEPHROSTOMY PLACEMENT RIGHT  Result Date: 09/28/2020 INDICATION: 55 year old female with a history of hydronephrosis secondary to cervical cancer, with the baseline imaging diagnosis 02/19/2017. The patient has had bilateral ureteral  stents in the past as a treatment, now with persisting hydronephrosis in the setting of double-J ureteral stents. EXAM: IR NEPHROSTOMY PLACEMENT RIGHT; IR NEPHROSTOMY PLACEMENT LEFT COMPARISON:  CT 09/26/2020 MEDICATIONS: 2 g Rocephin IV ANESTHESIA/SEDATION: Fentanyl 100 mcg IV; Versed 3.0 mg IV Moderate Sedation Time:  30 minutes The patient was continuously monitored during the procedure by the interventional radiology nurse under my direct supervision. CONTRAST:  34mL OMNIPAQUE IOHEXOL 300 MG/ML SOLN - administered into the collecting system(s) FLUOROSCOPY TIME:  Fluoroscopy Time: 3 minutes 30 seconds (114 mGy). COMPLICATIONS: None PROCEDURE: Informed written consent was obtained from the patient after a thorough discussion of the procedural risks, benefits and alternatives. All questions were addressed. Maximal Sterile Barrier Technique was utilized including caps, mask, sterile gowns, sterile gloves, sterile drape, hand hygiene and skin antiseptic. A timeout was performed prior to the initiation of the procedure. Patient positioned prone position on the fluoroscopy table. Ultrasound survey of the bilateral flank was performed with images stored and sent to PACs. Left: The patient  was then prepped and draped in the usual sterile fashion. 1% lidocaine was used to anesthetize the skin and subcutaneous tissues for local anesthesia. A Chiba needle was then used to access a posterior inferior calyx of the left kidney with ultrasound guidance. With spontaneous urine returned through the needle, passage of an 018 micro wire into the collecting system was performed under fluoroscopy. A small incision was made with an 11 blade scalpel, and the needle was removed from the wire. An [place] system was then advanced over the wire into the collecting system under fluoroscopy. The metal stiffener and inner dilator were removed, and then contrast confirmed location. Bentson wire was passed into the collecting system and the  sheath removed. Ten French dilation of the soft tissues was performed. Using modified Seldinger technique, a 10 French pigtail catheter drain was placed over the Bentson wire. Wire and inner stiffener removed, and the pigtail was formed in the collecting system. Small amount of contrast confirmed position of the catheter. Right: 1% lidocaine was used to anesthetize the skin and subcutaneous tissues for local anesthesia. A Chiba needle was then used to access a posterior inferior calyx of the right kidney with ultrasound guidance. With spontaneous urine returned through the needle, passage of an 018 micro wire into the collecting system was performed under fluoroscopy. A small incision was made with an 11 blade scalpel, and the needle was removed from the wire. An Hardy system was then advanced over the wire into the collecting system under fluoroscopy. The metal stiffener and inner dilator were removed. Contrast confirmed location. Bentson wire was passed into the collecting system and the sheath removed. Ten French dilation of the soft tissues was performed. Using modified Seldinger technique, a 10 French pigtail catheter drain was placed over the Bentson wire. Wire and inner stiffener removed, and the pigtail was formed in the collecting system. Small amount of contrast confirmed position of the catheter. Patient tolerated the procedure well and remained hemodynamically stable throughout. No complications were encountered and no significant blood loss encountered FINDINGS: Upon placement of the bilateral percutaneous nephrostomy, it is apparent that the patient has a relatively symmetric configuration of the kidney/collecting system, with bifurcated system. The lower pole moiety drains from the bilateral kidneys via a small short infundibulum into a relatively small renal pelvis bilaterally. The upper pole collecting system of the bilateral kidneys is drained via the position of the indwelling bilateral  double-J ureteral stent. IMPRESSION: Status post bilateral percutaneous nephrostomy, with placement of 10 French drains into the collecting system of the lower pole moiety of the kidneys, as above. The upper pole moiety of the bilateral kidneys are drained by the double-J ureteral stents and the lower pole moieties are drained via the percutaneous nephrostomy. Signed, Dulcy Fanny. Dellia Nims, RPVI [facility] Radiology Electronically Signed   By: Corrie Mckusick D.O.   On: 09/28/2020 15:11    Scheduled Meds: . calcium acetate  1,334 mg Oral TID WC  . Chlorhexidine Gluconate Cloth  6 each Topical Q0600  . FLUoxetine  10 mg Oral Daily  . heparin  5,000 Units Subcutaneous Q8H  . levothyroxine  175 mcg Oral Daily  . oxyCODONE  10 mg Oral Q12H  . polyethylene glycol  17 g Oral Daily  . senna  1 tablet Oral QHS  . sodium chloride flush  3 mL Intravenous Q12H  . temazepam  15 mg Oral Once   Continuous Infusions: . sodium chloride 250 mL (09/27/20 1858)  .  ceFAZolin (ANCEF) IV 2 g (09/29/20 1445)  . cefTRIAXone (ROCEPHIN)  IV       LOS: 6 days    Time spent: 35 mins    Barton Dubois, MD [facility]   If 7PM-7AM, please contact night-coverage [email or web address]  09/29/2020, 4:22 PM

## 2020-09-29 NOTE — CV Procedure (Signed)
TEE  Pt sedated by anesthesia with Propofol intravenouslly Bite guard placed in mouth TEE probe advanced to mid esophagus without difficulty  MV normal  No MR AV normal  Trivial AI TV normal  Mild TR PV grossly normal   No PI  LVEF and RVEF normal  LA, LAA without masses     Aorta normal     Full report to follow  Dorris Carnes MD

## 2020-09-29 NOTE — Transfer of Care (Signed)
Immediate Anesthesia Transfer of Care Note  Patient: Tanya Wright  Procedure(s) Performed: TRANSESOPHAGEAL ECHOCARDIOGRAM (TEE) WITH PROPOFOL (N/A )  Patient Location: PACU  Anesthesia Type:General  Level of Consciousness: awake, alert , oriented and patient cooperative  Airway & Oxygen Therapy: Patient Spontanous Breathing and Patient connected to nasal cannula oxygen  Post-op Assessment: Report given to RN, Post -op Vital signs reviewed and stable and Patient moving all extremities  Post vital signs: Reviewed and stable  Last Vitals:  Vitals Value Taken Time  BP    Temp    Pulse    Resp    SpO2      Last Pain:  Vitals:   09/29/20 1117  TempSrc: Oral  PainSc: 0-No pain      Patients Stated Pain Goal: 0 (53/79/43 2761)  Complications: No complications documented.

## 2020-09-29 NOTE — Progress Notes (Signed)
    The patient is scheduled for a TEE today at 11:30 with Dr. Harrington Challenger. Arranged by Richardson Dopp, PA-C yesterday and orders entered and consent obtained. Will verify with Echo they are aware as well.   Signed, Erma Heritage, PA-C 09/29/2020, 8:47 AM

## 2020-09-29 NOTE — Anesthesia Preprocedure Evaluation (Addendum)
Anesthesia Evaluation  Patient identified by MRN, date of birth, ID band Patient awake    Reviewed: Allergy & Precautions, NPO status , Patient's Chart, lab work & pertinent test results  History of Anesthesia Complications Negative for: history of anesthetic complications  Airway Mallampati: II  TM Distance: >3 FB Neck ROM: Full    Dental  (+) Dental Advisory Given, Missing   Pulmonary former smoker,    Pulmonary exam normal breath sounds clear to auscultation       Cardiovascular Exercise Tolerance: Good hypertension, Pt. on medications Normal cardiovascular exam Rhythm:Regular Rate:Normal     Neuro/Psych negative neurological ROS  negative psych ROS   GI/Hepatic (+)     substance abuse  marijuana use,   Endo/Other  Hypothyroidism   Renal/GU Renal Insufficiency and CRFRenal disease  Female GU complaint (stage 4 cervical cancer)     Musculoskeletal   Abdominal   Peds  Hematology  (+) anemia ,   Anesthesia Other Findings Sepsis   Reproductive/Obstetrics                          Anesthesia Physical Anesthesia Plan  ASA: IV  Anesthesia Plan: General   Post-op Pain Management:    Induction: Intravenous  PONV Risk Score and Plan: Propofol infusion  Airway Management Planned: Nasal Cannula and Natural Airway  Additional Equipment:   Intra-op Plan:   Post-operative Plan:   Informed Consent: I have reviewed the patients History and Physical, chart, labs and discussed the procedure including the risks, benefits and alternatives for the proposed anesthesia with the patient or authorized representative who has indicated his/her understanding and acceptance.     Dental advisory given  Plan Discussed with: CRNA and Surgeon  Anesthesia Plan Comments:       Anesthesia Quick Evaluation

## 2020-09-29 NOTE — TOC Initial Note (Signed)
Transition of Care [facility]) - Initial/Assessment Note    Patient Details  Name: Tanya Wright MRN: [id number] Date of Birth: 08/20/1965  Transition of [facility]) CM/SW Contact:    Salome Arnt, [place] Phone Number: 09/29/2020, 2:39 PM  Clinical Narrative:  Pt admitted with sepsis due to UTI. LCSW completed assessment with pt due to high risk readmission score. Pt reports she lives alone and is independent with ADLs. She states she is on disability due to stage IV cervical cancer. LCSW discussed possible need for IV antibiotics at d/c. Pt reports she is hoping she will not require IV antibiotics, but if so is sure she can manage at home with home health. LCSW notified Pam with [facility] who will follow.                  Expected Discharge Plan: [place] Barriers to Discharge: Continued Medical Work up   Patient Goals and CMS Choice Patient states their goals for this hospitalization and ongoing recovery are:: return home   Choice offered to / list presented to : Patient  Expected Discharge Plan and Services Expected Discharge Plan: [place] In-house Referral: Clinical Social Work     Living arrangements for the past 2 months: Single Family Home                                      Prior Living Arrangements/Services Living arrangements for the past 2 months: Single Family Home Lives with:: Self Patient language and need for interpreter reviewed:: Yes Do you feel safe going back to the place where you live?: Yes            Criminal Activity/Legal Involvement Pertinent to Current Situation/Hospitalization: No - Comment as needed  Activities of Daily Living Home Assistive Devices/Equipment: None ADL Screening (condition at time of admission) Patient's cognitive ability adequate to safely complete daily activities?: Yes Is the patient deaf or have difficulty hearing?: No Does the patient have difficulty seeing,  even when wearing glasses/contacts?: No Does the patient have difficulty concentrating, remembering, or making decisions?: No Patient able to express need for assistance with ADLs?: No Does the patient have difficulty dressing or bathing?: No Independently performs ADLs?: Yes (appropriate for developmental age) Does the patient have difficulty walking or climbing stairs?: No Weakness of Legs: None Weakness of Arms/Hands: None  Permission Sought/Granted                  Emotional Assessment       Orientation: : Oriented to Self,Oriented to Place,Oriented to  Time,Oriented to Situation Alcohol / Substance Use: Not Applicable Psych Involvement: No (comment)  Admission diagnosis:  UTI (urinary tract infection) [N39.0] Acute cystitis with hematuria [N30.01] AKI (acute kidney injury) ([place]) [N17.9] Sepsis ([place]) [A41.9] Patient Active Problem List   Diagnosis Date Noted  . Bacteremia 09/26/2020  . Bilateral hydronephrosis 09/26/2020  . Sepsis ([place]) 09/23/2020  . Malnutrition of moderate degree 04/19/2017  . AKI (acute kidney injury) ([place]) 04/16/2017  . Abdominal pain 04/16/2017  . Anemia in other chronic diseases classified elsewhere 03/14/2017  . Anemia 03/14/2017  . CKD (chronic kidney disease) stage 3, GFR 30-59 ml/min ([facility]) 02/20/2017  . UTI (urinary tract infection) 02/20/2017  . Uterine cervix cancer ([place]) 02/19/2017  . Thyroid mass of unclear etiology 02/19/2017  . Cancer determined by uterine cervix biopsy ([place]) 02/19/2017  PCP:  System, Provider Not In Pharmacy:   [place], [place] - [address] [place] [address] Phone: [phone] Fax: [phone]     Social Determinants of Health (SDOH) Interventions    Readmission Risk Interventions No flowsheet data found.

## 2020-09-29 NOTE — Progress Notes (Signed)
ID Update Note:  Patient chart reviewed this afternoon.  Underwent TEE this morning with Dr Harrington Challenger.  Prelim report indicates no evidence of endocarditis.  She is s/p bilateral PCN drainage tubes yesterday with IR.  They did not remove her port because it was not obviously infected, however, concern for me is that she would have seeding of her port.  Her repeat blood cultures are NGTD x 3 days and she remains on Ancef at this time.  She had a low grade temperature this morning at 530am of 100.5.  Recommendations at this time: -- continue Ancef.  Pharmacy has adjusted dosing -- hold off PICC line due to fever this morning -- monitor fevers and if continue would repeat blood cultures and reassess port removal -- monitor kidney function s/p PCN placement -- will continue peripherally following -- follow up formal TEE read    Raynelle Highland for Infectious Disease [facility] 09/29/2020, 3:43 PM

## 2020-09-30 ENCOUNTER — Encounter (HOSPITAL_COMMUNITY): Payer: Self-pay | Admitting: Internal Medicine

## 2020-09-30 LAB — CBC
HCT: 27.6 % — ABNORMAL LOW (ref 36.0–46.0)
Hemoglobin: 8.9 g/dL — ABNORMAL LOW (ref 12.0–15.0)
MCH: 31.3 pg (ref 26.0–34.0)
MCHC: 32.2 g/dL (ref 30.0–36.0)
MCV: 97.2 fL (ref 80.0–100.0)
Platelets: 317 10*3/uL (ref 150–400)
RBC: 2.84 MIL/uL — ABNORMAL LOW (ref 3.87–5.11)
RDW: 15.2 % (ref 11.5–15.5)
WBC: 6 10*3/uL (ref 4.0–10.5)
nRBC: 0 % (ref 0.0–0.2)

## 2020-09-30 LAB — BASIC METABOLIC PANEL
Anion gap: 8 (ref 5–15)
BUN: 20 mg/dL (ref 6–20)
CO2: 26 mmol/L (ref 22–32)
Calcium: 7.7 mg/dL — ABNORMAL LOW (ref 8.9–10.3)
Chloride: 103 mmol/L (ref 98–111)
Creatinine, Ser: 2.37 mg/dL — ABNORMAL HIGH (ref 0.44–1.00)
GFR, Estimated: 23 mL/min — ABNORMAL LOW (ref >60)
Glucose, Bld: 86 mg/dL (ref 70–99)
Potassium: 4.8 mmol/L (ref 3.5–5.1)
Sodium: 137 mmol/L (ref 135–145)

## 2020-09-30 LAB — GLUCOSE, CAPILLARY: Glucose-Capillary: 94 mg/dL (ref 70–99)

## 2020-09-30 NOTE — Plan of Care (Signed)

## 2020-09-30 NOTE — Progress Notes (Signed)
PROGRESS NOTE    Tanya Wright  RFF:[id number] DOB: 08/16/1965 DOA: 09/23/2020 PCP: System, Provider Not In    Brief Narrative:  55 year old female with a history of cervical cancer status post radiation therapy and bilateral distal ureteral obstruction managed with bilateral ureteral stents.  She is followed at [facility] urology and oncology.  Last stent exchange was 05/2020.  She is admitted to the hospital with sepsis secondary to urinary source.  She is also noted to have acute kidney injury and bilateral hydronephrosis.  Urology following.  Foley catheter placed for bladder decompression.  May need to consider percutaneous nephrostomies if renal function does not improve   Assessment & Plan:   Principal Problem:   UTI (urinary tract infection) Active Problems:   Uterine cervix cancer (HCC)   CKD (chronic kidney disease) stage 3, GFR 30-59 ml/min (HCC)   Anemia in other chronic diseases classified elsewhere   AKI (acute kidney injury) ([place])   Sepsis ([place])   Bacteremia   Bilateral hydronephrosis   Sepsis due to MSSA bacteremia and urinary tract infection -Patient noted to be febrile, tachycardic, with source of infection being urine and evidence of endorgan damage with acute kidney injury -Initially on Rocephin -She did have 1 out of 2 blood cultures positive for MSSA - Discussed with Dr. Juleen China and antibiotics changed to Ancef -repeat cultures sent 09/26/2020; no growth so far. -2D echo ordered, and demonstrated no vegetations.  Status post TEE demonstrating no vegetations. -she does have a port for chemotherapy, pending final decision for removal by IR and infectious disease.. -Hemodynamics currently stable -Patient afebrile; he remained afebrile for 24 more hours will be clear to place PICC line and arrange OPAT/outpatient follow-up with infectious disease service. -Continue to follow final repeat blood cultures; so far no growth after 4 days.  Acute kidney injury on chronic kidney  disease stage IIIa -Bilateral hydronephrosis with presence of double-J stents -Seen by urology with recommendations for continued IV hydration through the weekend and decompression of bladder with Foley catheter. -Status post percutaneous nephrostomy tube placement by IR on 09/28/2020; decision was made not to remove Port-A-Cath as indicated by infectious disease service as a catheter was no appearing to be infected currently. -Continue to follow any further urology service recommendations. -Baseline creatinine around 1.97 -Creatinine on admission noted to be 3.1; currently 2.37 -Continue to follow intermittently basic metabolic panels to assess renal function trend. -S/p bilateral percutaneous nephrostomy tube placement on 09/28/20  Stage IV cervical cancer -She is followed at [facility] follow-up as an outpatient  Anemia of chronic disease -No signs of bleeding -Continue to follow hemoglobin trend.  Hypothyroidism -Continue home dose Synthroid  Chronic pain syndrome -Continued home regimen of oxycodone and OxyContin  Depression -Continue on Prozac   DVT prophylaxis: heparin injection 5,000 Units Start: 09/23/20 1430 TED hose Start: 09/23/20 1416 SCDs Start: 09/23/20 1416  Code Status: Full code Family Communication: Discussed with patient Disposition Plan: Status is: Inpatient  Dispo: The patient is from: Home              Anticipated d/c is to: Home              Patient currently is not medically stable to d/c.   Difficult to place patient no    Consultants:  Urology Infectious Disease (phone) Cardiology service IR  Procedures:  Percutaneous nephrostomy tube placed 09/28/2020 TEE 09/29/20 (no vegetations; no significant valvular disease).  Antimicrobials:  Ceftriaxone 6/2 >6/4 Ancef 6/4>   Subjective:  No chest pain, no nausea, no vomiting, reports feeling better.  Currently afebrile. Good urine output appreciated.  Objective: Vitals:   09/29/20  1700 09/29/20 2217 09/30/20 0538 09/30/20 1402  BP: 117/72 101/60 104/72 94/69  Pulse: 75 87 73 92  Resp: 18 17 18 20   Temp: 98.8 F (37.1 C) 98.6 F (37 C) 98.7 F (37.1 C) 98.4 F (36.9 C)  TempSrc: Oral Oral  Oral  SpO2: 100% 98% 96% 98%  Weight:      Height:        Intake/Output Summary (Last 24 hours) at 09/30/2020 1705 Last data filed at 09/30/2020 0900 Gross per 24 hour  Intake 360 ml  Output 2475 ml  Net -2115 ml   Filed Weights   09/27/20 0500 09/28/20 0500 09/29/20 0500  Weight: 91 kg 91 kg 89 kg    Examination: General exam: Alert, awake, oriented x 3; afebrile. Afebrile, no nausea or vomiting.  Patient expressed intermittent flank pain. Respiratory system: Clear to auscultation. Respiratory effort normal. Cardiovascular system:RRR. No murmurs, rubs, gallops. No JVD Gastrointestinal system: Abdomen is nondistended, soft and nontender. No organomegaly or masses felt. Normal bowel sounds heard. Central nervous system: Alert and oriented. No focal neurological deficits. Extremities: No cyanosis, no clubbing.  Skin: No petechiae.  Percutaneous nephrostomy tube in place. Psychiatry: Judgement and insight appear normal. Mood & affect appropriate.   Data Reviewed: I have personally reviewed following labs and imaging studies  CBC: Recent Labs  Lab 09/25/20 0521 09/26/20 0351 09/27/20 0439 09/28/20 0425 09/30/20 0429  WBC 6.7 5.4 4.8 4.6 6.0  HGB 10.0* 9.3* 9.4* 9.2* 8.9*  HCT 30.8* 29.1* 29.6* 27.7* 27.6*  MCV 96.3 96.7 99.0 95.2 97.2  PLT 134* 133* 185 220 956   Basic Metabolic Panel: Recent Labs  Lab 09/25/20 0521 09/26/20 0351 09/27/20 0439 09/28/20 0425 09/30/20 0429  NA 133* 134* 139 137 137  K 3.6 3.7 4.2 4.2 4.8  CL 102 104 108 107 103  CO2 22 23 24 24 26   GLUCOSE 100* 106* 110* 94 86  BUN 39* 33* 28* 24* 20  CREATININE 2.96* 2.84* 2.85* 2.45* 2.37*  CALCIUM 7.2* 7.0* 7.5* 7.6* 7.7*   GFR: Estimated Creatinine Clearance: 32.7 mL/min (A)  (by C-G formula based on SCr of 2.37 mg/dL (H)).   Coagulation Profile: Recent Labs  Lab 09/24/20 0510  INR 1.2   CBG: Recent Labs  Lab 09/25/20 0746 09/27/20 0723 09/28/20 0732 09/29/20 0716 09/30/20 0717  GLUCAP 97 124* 93 100* 94   Sepsis Labs: Recent Labs  Lab 09/23/20 2226  LATICACIDVEN 1.0    Recent Results (from the past 240 hour(s))  Resp Panel by RT-PCR (Flu A&B, Covid) Nasopharyngeal Swab     Status: None   Collection Time: 09/23/20 11:23 AM   Specimen: Nasopharyngeal Swab; Nasopharyngeal(NP) swabs in vial transport medium  Result Value Ref Range Status   SARS Coronavirus 2 by RT PCR NEGATIVE NEGATIVE Final    Comment: (NOTE) SARS-CoV-2 target nucleic acids are NOT DETECTED.  The SARS-CoV-2 RNA is generally detectable in upper respiratory specimens during the acute phase of infection. The lowest concentration of SARS-CoV-2 viral copies this assay can detect is 138 copies/mL. A negative result does not preclude SARS-Cov-2 infection and should not be used as the sole basis for treatment or other patient management decisions. A negative result may occur with  improper specimen collection/handling, submission of specimen other than nasopharyngeal swab, presence of viral mutation(s) within the areas targeted by this assay,  and inadequate number of viral copies(<138 copies/mL). A negative result must be combined with clinical observations, patient history, and epidemiological information. The expected result is Negative.  Fact Sheet for Patients:  EntrepreneurPulse.com.au  Fact Sheet for Healthcare Providers:  IncredibleEmployment.be  This test is no t yet approved or cleared by the [place] FDA and  has been authorized for detection and/or diagnosis of SARS-CoV-2 by FDA under an Emergency Use Authorization (EUA). This EUA will remain  in effect (meaning this test can be used) for the duration of the COVID-19  declaration under Section 564(b)(1) of the Act, 21 U.S.C.section 360bbb-3(b)(1), unless the authorization is terminated  or revoked sooner.       Influenza A by PCR NEGATIVE NEGATIVE Final   Influenza B by PCR NEGATIVE NEGATIVE Final    Comment: (NOTE) The Xpert Xpress SARS-CoV-2/FLU/RSV plus assay is intended as an aid in the diagnosis of influenza from Nasopharyngeal swab specimens and should not be used as a sole basis for treatment. Nasal washings and aspirates are unacceptable for Xpert Xpress SARS-CoV-2/FLU/RSV testing.  Fact Sheet for Patients: EntrepreneurPulse.com.au  Fact Sheet for Healthcare Providers: IncredibleEmployment.be  This test is not yet approved or cleared by the [place] FDA and has been authorized for detection and/or diagnosis of SARS-CoV-2 by FDA under an Emergency Use Authorization (EUA). This EUA will remain in effect (meaning this test can be used) for the duration of the COVID-19 declaration under Section 564(b)(1) of the Act, 21 U.S.C. section 360bbb-3(b)(1), unless the authorization is terminated or revoked.  Performed at [facility], [address], [place], [place] [address]   Culture, blood (routine x 2)     Status: Abnormal   Collection Time: 09/23/20  4:09 PM   Specimen: BLOOD RIGHT FOREARM  Result Value Ref Range Status   Specimen Description   Final    BLOOD RIGHT FOREARM Performed at [facility] [address], [place], [place] [address]    Special Requests   Final    Blood Culture results may not be optimal due to an inadequate volume of blood received in culture bottles BOTTLES DRAWN AEROBIC AND ANAEROBIC Performed at [facility], [address] [place], [place] [address]    Culture  Setup Time   Final    GRAM POSITIVE COCCI BOTH AEROBIC AND ANAEROBIC BOTTLES Gram Stain Report Called to,Read Back By and Verified With: [facility] CAULDER  09/24/20 @ 1011 BY S BEARD CRITICAL RESULT CALLED TO, READ  BACK BY AND VERIFIED WITH: Golden Pop RN 1700 09/24/20 A BROWNING Performed at [facility], [place] [address] [place], [place] [address]    Culture STAPHYLOCOCCUS AUREUS (A)  Final   Report Status 09/26/2020 FINAL  Final   Organism ID, Bacteria STAPHYLOCOCCUS AUREUS  Final      Susceptibility   Staphylococcus aureus - MIC*    CIPROFLOXACIN <=0.5 SENSITIVE Sensitive     ERYTHROMYCIN <=0.25 SENSITIVE Sensitive     GENTAMICIN <=0.5 SENSITIVE Sensitive     OXACILLIN <=0.25 SENSITIVE Sensitive     TETRACYCLINE <=1 SENSITIVE Sensitive     VANCOMYCIN 1 SENSITIVE Sensitive     TRIMETH/SULFA <=10 SENSITIVE Sensitive     CLINDAMYCIN <=0.25 SENSITIVE Sensitive     RIFAMPIN <=0.5 SENSITIVE Sensitive     Inducible Clindamycin NEGATIVE Sensitive     * STAPHYLOCOCCUS AUREUS  Culture, blood (routine x 2)     Status: None   Collection Time: 09/23/20  4:09 PM   Specimen: BLOOD  Result Value  Ref Range Status   Specimen Description BLOOD LEFT ANTECUBITAL  Final   Special Requests   Final    Blood Culture adequate volume BOTTLES DRAWN AEROBIC AND ANAEROBIC   Culture   Final    NO GROWTH 5 DAYS Performed at [facility], [address], [place], [place] [address]    Report Status 09/28/2020 FINAL  Final  Blood Culture ID Panel (Reflexed)     Status: Abnormal   Collection Time: 09/23/20  4:09 PM  Result Value Ref Range Status   Enterococcus faecalis NOT DETECTED NOT DETECTED Final   Enterococcus Faecium NOT DETECTED NOT DETECTED Final   Listeria monocytogenes NOT DETECTED NOT DETECTED Final   Staphylococcus species DETECTED (A) NOT DETECTED Final    Comment: CRITICAL RESULT CALLED TO, READ BACK BY AND VERIFIED WITH: Golden Pop RN 1700 09/24/20 A BROWNING    Staphylococcus aureus (BCID) DETECTED (A) NOT DETECTED Final    Comment: CRITICAL RESULT CALLED TO, READ BACK BY AND VERIFIED WITH: Golden Pop RN 1700 09/24/20 A BROWNING    Staphylococcus epidermidis NOT DETECTED NOT DETECTED Final   Staphylococcus  lugdunensis NOT DETECTED NOT DETECTED Final   Streptococcus species NOT DETECTED NOT DETECTED Final   Streptococcus agalactiae NOT DETECTED NOT DETECTED Final   Streptococcus pneumoniae NOT DETECTED NOT DETECTED Final   Streptococcus pyogenes NOT DETECTED NOT DETECTED Final   A.calcoaceticus-baumannii NOT DETECTED NOT DETECTED Final   Bacteroides fragilis NOT DETECTED NOT DETECTED Final   Enterobacterales NOT DETECTED NOT DETECTED Final   Enterobacter cloacae complex NOT DETECTED NOT DETECTED Final   Escherichia coli NOT DETECTED NOT DETECTED Final   Klebsiella aerogenes NOT DETECTED NOT DETECTED Final   Klebsiella oxytoca NOT DETECTED NOT DETECTED Final   Klebsiella pneumoniae NOT DETECTED NOT DETECTED Final   Proteus species NOT DETECTED NOT DETECTED Final   Salmonella species NOT DETECTED NOT DETECTED Final   Serratia marcescens NOT DETECTED NOT DETECTED Final   Haemophilus influenzae NOT DETECTED NOT DETECTED Final   Neisseria meningitidis NOT DETECTED NOT DETECTED Final   Pseudomonas aeruginosa NOT DETECTED NOT DETECTED Final   Stenotrophomonas maltophilia NOT DETECTED NOT DETECTED Final   Candida albicans NOT DETECTED NOT DETECTED Final   Candida auris NOT DETECTED NOT DETECTED Final   Candida glabrata NOT DETECTED NOT DETECTED Final   Candida krusei NOT DETECTED NOT DETECTED Final   Candida parapsilosis NOT DETECTED NOT DETECTED Final   Candida tropicalis NOT DETECTED NOT DETECTED Final   Cryptococcus neoformans/gattii NOT DETECTED NOT DETECTED Final   Meth resistant mecA/C and MREJ NOT DETECTED NOT DETECTED Final    Comment: Performed at [facility], [address], [place], [place] [address]  MRSA PCR Screening     Status: None   Collection Time: 09/23/20  5:05 PM   Specimen: Nasopharyngeal  Result Value Ref Range Status   MRSA by PCR NEGATIVE NEGATIVE Final    Comment:        The GeneXpert MRSA Assay (FDA approved for NASAL specimens only), is one component of  a comprehensive MRSA colonization surveillance program. It is not intended to diagnose MRSA infection nor to guide or monitor treatment for MRSA infections. Performed at [facility], [address], [place], [place] [address]   Urine culture     Status: None   Collection Time: 09/25/20  9:29 AM   Specimen: Urine, Clean Catch  Result Value Ref Range Status   Specimen Description   Final    URINE, CLEAN CATCH  Performed at [facility], [address], [place], [place] [address]    Special Requests   Final    NONE Performed at [facility], [address] [place], [place] [address]    Culture   Final    NO GROWTH Performed at [facility], [place] [address] [place], [place] [address]    Report Status 09/26/2020 FINAL  Final  Culture, blood (routine x 2)     Status: None (Preliminary result)   Collection Time: 09/26/20  3:51 AM   Specimen: BLOOD RIGHT HAND  Result Value Ref Range Status   Specimen Description BLOOD RIGHT HAND  Final   Special Requests   Final    BOTTLES DRAWN AEROBIC AND ANAEROBIC Blood Culture adequate volume   Culture   Final    NO GROWTH 4 DAYS Performed at [facility], [address], [place], [place] [address]    Report Status PENDING  Incomplete  Culture, blood (routine x 2)     Status: None (Preliminary result)   Collection Time: 09/26/20  3:58 AM   Specimen: BLOOD LEFT HAND  Result Value Ref Range Status   Specimen Description BLOOD LEFT HAND  Final   Special Requests   Final    BOTTLES DRAWN AEROBIC AND ANAEROBIC Blood Culture adequate volume   Culture   Final    NO GROWTH 4 DAYS Performed at [facility], [address], [place], [place] [address]    Report Status PENDING  Incomplete     Radiology Studies: ECHO TEE  Result Date: 09/29/2020    TRANSESOPHOGEAL ECHO REPORT   Patient Name:   Tanya Wright Date of Exam: 09/29/2020 Medical Rec #:  [id number]    Height:       71.0 in Accession #:    [id number]   Weight:       196.2 lb Date of Birth:   Nov 11, 1965    BSA:          2.091 m Patient Age:    55 years     BP:           111/82 mmHg Patient Gender: F            HR:           80 bpm. Exam Location:  [facility] Procedure: Transesophageal Echo, Cardiac Doppler and Color Doppler Indications:    Bacteremia R78.81  History:        Patient has prior history of Echocardiogram examinations, most                 recent 09/26/2020. Risk Factors:Hypertension. CKD (chronic kidney                 disease) Thyroid mass of unclear etiology. Uterine cervix cancer                 Patient.  Sonographer:    Alvino Chapel RCS Referring Phys: [id number] SCOTT T WEAVER PROCEDURE: The transesophogeal probe was passed without difficulty through the esophogus of the patient. Sedation performed by different physician. The patient developed no complications during the procedure. IMPRESSIONS  1. No evidence of endocarditis.  2. Left ventricular ejection fraction, by estimation, is 60 to 65%. The left ventricle has normal function.  3. Right ventricular systolic function is normal. The right ventricular size is normal.  4. No left atrial/left atrial appendage thrombus was detected.  5. The mitral valve is normal in structure. Mild mitral valve regurgitation.  6. The aortic  valve is normal in structure. Aortic valve regurgitation is trivial.  7. No shunt by color doppler. FINDINGS  Left Ventricle: Left ventricular ejection fraction, by estimation, is 60 to 65%. The left ventricle has normal function. The left ventricular internal cavity size was normal in size. Right Ventricle: The right ventricular size is normal. Right vetricular wall thickness was not assessed. Right ventricular systolic function is normal. Left Atrium: Left atrial size was normal in size. No left atrial/left atrial appendage thrombus was detected. Right Atrium: Right atrial size was normal in size. Pericardium: There is no evidence of pericardial effusion. Mitral Valve: The mitral valve is normal in structure. Mild mitral  valve regurgitation. Tricuspid Valve: The tricuspid valve is normal in structure. Tricuspid valve regurgitation is mild. Aortic Valve: The aortic valve is normal in structure. Aortic valve regurgitation is trivial. Pulmonic Valve: The pulmonic valve was grossly normal. Pulmonic valve regurgitation is not visualized. Aorta: The aortic root and ascending aorta are structurally normal, with no evidence of dilitation. IAS/Shunts: No shunt by color doppler. Dorris Carnes MD Electronically signed by Dorris Carnes MD Signature Date/Time: 09/29/2020/4:19:36 PM    Final      Scheduled Meds:  calcium acetate  1,334 mg Oral TID WC   Chlorhexidine Gluconate Cloth  6 each Topical Q0600   FLUoxetine  10 mg Oral Daily   heparin  5,000 Units Subcutaneous Q8H   levothyroxine  175 mcg Oral Daily   oxyCODONE  10 mg Oral Q12H   polyethylene glycol  17 g Oral Daily   senna  1 tablet Oral QHS   sodium chloride flush  3 mL Intravenous Q12H   temazepam  15 mg Oral Once   Continuous Infusions:  sodium chloride 250 mL (09/27/20 1858)    ceFAZolin (ANCEF) IV 2 g (09/30/20 1457)   cefTRIAXone (ROCEPHIN)  IV       LOS: 7 days    Time spent: 39 mins    Barton Dubois, MD [facility]   If 7PM-7AM, please contact night-coverage [email or web address]  09/30/2020, 5:05 PM

## 2020-10-01 ENCOUNTER — Inpatient Hospital Stay: Payer: Self-pay

## 2020-10-01 LAB — BASIC METABOLIC PANEL
Anion gap: 7 (ref 5–15)
BUN: 20 mg/dL (ref 6–20)
CO2: 26 mmol/L (ref 22–32)
Calcium: 8.2 mg/dL — ABNORMAL LOW (ref 8.9–10.3)
Chloride: 103 mmol/L (ref 98–111)
Creatinine, Ser: 2.29 mg/dL — ABNORMAL HIGH (ref 0.44–1.00)
GFR, Estimated: 24 mL/min — ABNORMAL LOW (ref >60)
Glucose, Bld: 105 mg/dL — ABNORMAL HIGH (ref 70–99)
Potassium: 4.9 mmol/L (ref 3.5–5.1)
Sodium: 136 mmol/L (ref 135–145)

## 2020-10-01 LAB — CULTURE, BLOOD (ROUTINE X 2)
Culture: NO GROWTH
Culture: NO GROWTH
Special Requests: ADEQUATE
Special Requests: ADEQUATE

## 2020-10-01 LAB — GLUCOSE, CAPILLARY: Glucose-Capillary: 102 mg/dL — ABNORMAL HIGH (ref 70–99)

## 2020-10-01 NOTE — Progress Notes (Addendum)
PHARMACY CONSULT NOTE FOR:  OUTPATIENT  PARENTERAL ANTIBIOTIC THERAPY (OPAT)  Indication: MSSA bacteremia Regimen: Cefazolin 2000 mg IV every 8 hours. Monitor renal function for dose adjustment End date: October 26, 2020  IV antibiotic discharge orders are pended. To discharging provider:  please sign these orders via discharge navigator,  Select New Orders & click on the button choice - Manage This Unsigned Work.     Thank you for allowing pharmacy to be a part of this patient's care.  Ramond Craver 10/01/2020, 10:36 AM

## 2020-10-01 NOTE — Care Management Important Message (Signed)
Important Message  Patient Details  Name: Tanya Wright MRN: [id number] Date of Birth: 01-Jul-1965   Medicare Important Message Given:  Yes     Tommy Medal 10/01/2020, 9:21 AM

## 2020-10-01 NOTE — TOC Progression Note (Addendum)
Transition of Care [facility]) - Progression Note    Patient Details  Name: Tanya Wright MRN: [id number] Date of Birth: 17-Apr-1966  Transition of [facility] CM/SW Contact  Natasha Bence, LCSW Phone Number: 10/01/2020, 1:28 PM  Clinical Narrative:    CSW notified that patient is refusing home IV antibiotics due to limited assistance at home. CSW inquired if patient is agreeable to SNF. MD inquired about patient can be reviewed for LTAC. CSW referred patient to  [facility]. [facility] agreeable to review patient. TOC to follow.   Addendum  [facility] with [facility]AC reported that they are not able to take patient due to chemo and urology requirements. CS[facility]. [facility] reported that [facility] are also not able to provide placement due to current Chemo treatments. Patient agreeable to SNF. CSW referred patient to [facility] SNF's CSW aware that SNF's may also have similar stipulations in regards Chemo treatments. CSW notified supervisor via email. TOC to follow.  Expected Discharge Plan: [place] Barriers to Discharge: Continued Medical Work up  Expected Discharge Plan and Services Expected Discharge Plan: [place] In-house Referral: Clinical Social Work     Living arrangements for the past 2 months: Single Family Home                                       Social Determinants of Health (SDOH) Interventions    Readmission Risk Interventions No flowsheet data found.

## 2020-10-01 NOTE — Progress Notes (Signed)
Brief ID note:  Patient chart reviewed this morning.  TEE without evidence of vegetations and she has remained afebrile with last recorded low-grade temperature at 5:30 AM on 09/29/2020.  Her repeat blood cultures are currently no growth at 4 days.  Renal function has improved after percutaneous nephrostomy tube placement on 09/28/2020.  See final antibiotic recommendations below and would be okay with PICC line placement at this time from my perspective.  Diagnosis: Bacteremia  Culture Result: MSSA  Allergies  Allergen Reactions   Oxycodone Itching    OPAT Orders Discharge antibiotics to be given via PICC line Discharge antibiotics: Per pharmacy protocol Cefazolin  Duration: 4 weeks End Date: October 26, 2020  [facility] Care Per Protocol:  Home health RN for IV administration and teaching; PICC line care and labs.    Labs weekly while on IV antibiotics: __xx CBC with differential __xx BMP   _xx_ Please pull PIC at completion of IV antibiotics __ Please leave PIC in place until doctor has seen patient or been notified  Fax weekly labs to [phone]  Clinic Follow Up Appt: 10/20/20 at 10:30am with Dr Juleen China at [facility]

## 2020-10-01 NOTE — Progress Notes (Signed)
PROGRESS NOTE    Tanya Wright  PYP:[id number] DOB: Nov 08, 1965 DOA: 09/23/2020 PCP: System, Provider Not In    Brief Narrative:  55 year old female with a history of cervical cancer status post radiation therapy and bilateral distal ureteral obstruction managed with bilateral ureteral stents.  She is followed at [facility] urology and oncology.  Last stent exchange was 05/2020.  She is admitted to the hospital with sepsis secondary to urinary source.  She is also noted to have acute kidney injury and bilateral hydronephrosis.  Urology following.  Foley catheter placed for bladder decompression.  May need to consider percutaneous nephrostomies if renal function does not improve   Assessment & Plan:   Principal Problem:   UTI (urinary tract infection) Active Problems:   Uterine cervix cancer (HCC)   CKD (chronic kidney disease) stage 3, GFR 30-59 ml/min (HCC)   Anemia in other chronic diseases classified elsewhere   AKI (acute kidney injury) ([place])   Sepsis ([place])   Bacteremia   Bilateral hydronephrosis   Sepsis due to MSSA bacteremia and urinary tract infection -Patient noted to be febrile, tachycardic, with source of infection being urine and evidence of endorgan damage with acute kidney injury -Initially on Rocephin; then antibiotics switched to ANCEF as per ID rec's. -She did have blood cultures positive for MSSA. -Discussed with Dr. Juleen China and antibiotics changed to Ancef -repeat cultures sent 09/26/2020; no growth so far. -2D echo ordered, and demonstrated no vegetations.  Status post TEE demonstrating no vegetations as well. -Hemodynamically stable currently  -Patient afebrile; patient has remained afebrile for 48 more hours repeat blood culture has remained without any growth.  Safe to place PICC line. -Plan is to continue IV antibiotics using ANCEF until 10/26/20.    Acute kidney injury on chronic kidney disease stage IIIa -Bilateral hydronephrosis with presence of double-J  stents -Continue to maintain adequate hydration follow clinical response. -Status post percutaneous nephrostomy tube placement by IR on 09/28/2020; decision was made not to remove Port-A-Cath as indicated by infectious disease service, Given the Fact that the catheter was no appearing to be infected currently and patient responding satisfactorily to antibiotics.  -Baseline creatinine around 1.97 -Creatinine on admission noted to be 3.1; currently 2.29 -Continue to follow intermittently basic metabolic panels to assess renal function trend. -S/p bilateral percutaneous nephrostomy tube placement on 09/28/20 -Discussed with urology service plan is to discontinue Foley catheter and attempt voiding trial. -Outpatient follow-up with urology service at [facility].  Stage IV cervical cancer -She is followed at [facility] follow-up as an outpatient  Anemia of chronic disease -No signs of bleeding -Continue to follow hemoglobin trend.  Hypothyroidism -Continue home dose Synthroid  Chronic pain syndrome -Continued home regimen of oxycodone and OxyContin  Depression -Continue on Prozac   DVT prophylaxis: heparin injection 5,000 Units Start: 09/23/20 1430 TED hose Start: 09/23/20 1416 SCDs Start: 09/23/20 1416  Code Status: Full code Family Communication: Discussed with patient Disposition Plan: Status is: Inpatient  Dispo: The patient is from: Home              Anticipated d/c is to:SNF              Patient currently is not medically stable to d/c.  Will place PICC line, continue IV antibiotics and patient may require skilled nursing facility placement for further treatment and management (she is presently having enough support at home and having a PICC line and nephrostomy tubes placed is too much for he to manage alone).  Difficult to place patient no    Consultants:  Urology Infectious Disease (phone) Cardiology service IR  Procedures:  Percutaneous nephrostomy tube  placed 09/28/2020 TEE 09/29/20 (no vegetations; no significant valvular disease).  Antimicrobials:  Ceftriaxone 6/2 >6/4 Ancef 6/4>   Subjective:  Patient has remained afebrile; no nausea, no vomiting, no chest pain.  Reports good urine output and decrease drainage through nephrostomy tubes.  Objective: Vitals:   09/30/20 2138 10/01/20 0426 10/01/20 0500 10/01/20 1342  BP: (!) 129/94 95/72  111/73  Pulse: 66 76  60  Resp: 17 17    Temp: 98.3 F (36.8 C) 98.1 F (36.7 C)  98.5 F (36.9 C)  TempSrc:    Oral  SpO2: 98% 96%  95%  Weight:   86.5 kg   Height:        Intake/Output Summary (Last 24 hours) at 10/01/2020 1618 Last data filed at 10/01/2020 1300 Gross per 24 hour  Intake 2280 ml  Output 2400 ml  Net -120 ml   Filed Weights   09/28/20 0500 09/29/20 0500 10/01/20 0500  Weight: 91 kg 89 kg 86.5 kg    Examination: General exam: Alert, awake, oriented x 3; reports feeling better.  No chest pain, no nausea, no vomiting.  Still complaining of intermittent bilateral flank pain.  Has remained afebrile. Respiratory system: Clear to auscultation. Respiratory effort normal.  No using accessory muscles. Cardiovascular system:RRR. No murmurs, rubs, gallops.  No JVD Gastrointestinal system: Abdomen is nondistended, soft and nontender. No organomegaly or masses felt. Normal bowel sounds heard.  Percutaneous nephrostomy tube in place. Central nervous system: Alert and oriented. No focal neurological deficits. Extremities: No cyanosis or clubbing. Skin: No petechiae. Psychiatry: Judgement and insight appear normal. Mood & affect appropriate.   Data Reviewed: I have personally reviewed following labs and imaging studies  CBC: Recent Labs  Lab 09/25/20 0521 09/26/20 0351 09/27/20 0439 09/28/20 0425 09/30/20 0429  WBC 6.7 5.4 4.8 4.6 6.0  HGB 10.0* 9.3* 9.4* 9.2* 8.9*  HCT 30.8* 29.1* 29.6* 27.7* 27.6*  MCV 96.3 96.7 99.0 95.2 97.2  PLT 134* 133* 185 220 263   Basic  Metabolic Panel: Recent Labs  Lab 09/26/20 0351 09/27/20 0439 09/28/20 0425 09/30/20 0429 10/01/20 0745  NA 134* 139 137 137 136  K 3.7 4.2 4.2 4.8 4.9  CL 104 108 107 103 103  CO2 23 24 24 26 26   GLUCOSE 106* 110* 94 86 105*  BUN 33* 28* 24* 20 20  CREATININE 2.84* 2.85* 2.45* 2.37* 2.29*  CALCIUM 7.0* 7.5* 7.6* 7.7* 8.2*   GFR: Estimated Creatinine Clearance: 33.4 mL/min (A) (by C-G formula based on SCr of 2.29 mg/dL (H)).   Coagulation Profile: No results for input(s): INR, PROTIME in the last 168 hours.  CBG: Recent Labs  Lab 09/27/20 0723 09/28/20 0732 09/29/20 0716 09/30/20 0717 10/01/20 0731  GLUCAP 124* 93 100* 94 102*   Sepsis Labs: No results for input(s): PROCALCITON, LATICACIDVEN in the last 168 hours.   Recent Results (from the past 240 hour(s))  Resp Panel by RT-PCR (Flu A&B, Covid) Nasopharyngeal Swab     Status: None   Collection Time: 09/23/20 11:23 AM   Specimen: Nasopharyngeal Swab; Nasopharyngeal(NP) swabs in vial transport medium  Result Value Ref Range Status   SARS Coronavirus 2 by RT PCR NEGATIVE NEGATIVE Final    Comment: (NOTE) SARS-CoV-2 target nucleic acids are NOT DETECTED.  The SARS-CoV-2 RNA is generally detectable in upper respiratory specimens during the acute phase of  infection. The lowest concentration of SARS-CoV-2 viral copies this assay can detect is 138 copies/mL. A negative result does not preclude SARS-Cov-2 infection and should not be used as the sole basis for treatment or other patient management decisions. A negative result may occur with  improper specimen collection/handling, submission of specimen other than nasopharyngeal swab, presence of viral mutation(s) within the areas targeted by this assay, and inadequate number of viral copies(<138 copies/mL). A negative result must be combined with clinical observations, patient history, and epidemiological information. The expected result is Negative.  Fact Sheet for  Patients:  EntrepreneurPulse.com.au  Fact Sheet for Healthcare Providers:  IncredibleEmployment.be  This test is no t yet approved or cleared by the [place] FDA and  has been authorized for detection and/or diagnosis of SARS-CoV-2 by FDA under an Emergency Use Authorization (EUA). This EUA will remain  in effect (meaning this test can be used) for the duration of the COVID-19 declaration under Section 564(b)(1) of the Act, 21 U.S.C.section 360bbb-3(b)(1), unless the authorization is terminated  or revoked sooner.       Influenza A by PCR NEGATIVE NEGATIVE Final   Influenza B by PCR NEGATIVE NEGATIVE Final    Comment: (NOTE) The Xpert Xpress SARS-CoV-2/FLU/RSV plus assay is intended as an aid in the diagnosis of influenza from Nasopharyngeal swab specimens and should not be used as a sole basis for treatment. Nasal washings and aspirates are unacceptable for Xpert Xpress SARS-CoV-2/FLU/RSV testing.  Fact Sheet for Patients: EntrepreneurPulse.com.au  Fact Sheet for Healthcare Providers: IncredibleEmployment.be  This test is not yet approved or cleared by the [place] FDA and has been authorized for detection and/or diagnosis of SARS-CoV-2 by FDA under an Emergency Use Authorization (EUA). This EUA will remain in effect (meaning this test can be used) for the duration of the COVID-19 declaration under Section 564(b)(1) of the Act, 21 U.S.C. section 360bbb-3(b)(1), unless the authorization is terminated or revoked.  Performed at [facility], [address] [place], [place] [address]   Culture, blood (routine x 2)     Status: Abnormal   Collection Time: 09/23/20  4:09 PM   Specimen: BLOOD RIGHT FOREARM  Result Value Ref Range Status   Specimen Description   Final    BLOOD RIGHT FOREARM Performed at [facility], [address], [place], [place] [address]    Special Requests   Final    Blood  Culture results may not be optimal due to an inadequate volume of blood received in culture bottles BOTTLES DRAWN AEROBIC AND ANAEROBIC Performed at [facility], [address], [place], [place] [address]    Culture  Setup Time   Final    GRAM POSITIVE COCCI BOTH AEROBIC AND ANAEROBIC BOTTLES Gram Stain Report Called to,Read Back By and Verified With: [facility] CAULDER  09/24/20 @ 1011 BY S BEARD CRITICAL RESULT CALLED TO, READ BACK BY AND VERIFIED WITH: Golden Pop RN 1700 09/24/20 A BROWNING Performed at [facility], [place] [address] [place], [place] [address]    Culture STAPHYLOCOCCUS AUREUS (A)  Final   Report Status 09/26/2020 FINAL  Final   Organism ID, Bacteria STAPHYLOCOCCUS AUREUS  Final      Susceptibility   Staphylococcus aureus - MIC*    CIPROFLOXACIN <=0.5 SENSITIVE Sensitive     ERYTHROMYCIN <=0.25 SENSITIVE Sensitive     GENTAMICIN <=0.5 SENSITIVE Sensitive     OXACILLIN <=0.25 SENSITIVE Sensitive     TETRACYCLINE <=1 SENSITIVE Sensitive     VANCOMYCIN 1 SENSITIVE  Sensitive     TRIMETH/SULFA <=10 SENSITIVE Sensitive     CLINDAMYCIN <=0.25 SENSITIVE Sensitive     RIFAMPIN <=0.5 SENSITIVE Sensitive     Inducible Clindamycin NEGATIVE Sensitive     * STAPHYLOCOCCUS AUREUS  Culture, blood (routine x 2)     Status: None   Collection Time: 09/23/20  4:09 PM   Specimen: BLOOD  Result Value Ref Range Status   Specimen Description BLOOD LEFT ANTECUBITAL  Final   Special Requests   Final    Blood Culture adequate volume BOTTLES DRAWN AEROBIC AND ANAEROBIC   Culture   Final    NO GROWTH 5 DAYS Performed at [facility], [address], [place], [place] [address]    Report Status 09/28/2020 FINAL  Final  Blood Culture ID Panel (Reflexed)     Status: Abnormal   Collection Time: 09/23/20  4:09 PM  Result Value Ref Range Status   Enterococcus faecalis NOT DETECTED NOT DETECTED Final   Enterococcus Faecium NOT DETECTED NOT DETECTED Final   Listeria monocytogenes NOT DETECTED NOT  DETECTED Final   Staphylococcus species DETECTED (A) NOT DETECTED Final    Comment: CRITICAL RESULT CALLED TO, READ BACK BY AND VERIFIED WITH: Golden Pop RN 1700 09/24/20 A BROWNING    Staphylococcus aureus (BCID) DETECTED (A) NOT DETECTED Final    Comment: CRITICAL RESULT CALLED TO, READ BACK BY AND VERIFIED WITH: Golden Pop RN 1700 09/24/20 A BROWNING    Staphylococcus epidermidis NOT DETECTED NOT DETECTED Final   Staphylococcus lugdunensis NOT DETECTED NOT DETECTED Final   Streptococcus species NOT DETECTED NOT DETECTED Final   Streptococcus agalactiae NOT DETECTED NOT DETECTED Final   Streptococcus pneumoniae NOT DETECTED NOT DETECTED Final   Streptococcus pyogenes NOT DETECTED NOT DETECTED Final   A.calcoaceticus-baumannii NOT DETECTED NOT DETECTED Final   Bacteroides fragilis NOT DETECTED NOT DETECTED Final   Enterobacterales NOT DETECTED NOT DETECTED Final   Enterobacter cloacae complex NOT DETECTED NOT DETECTED Final   Escherichia coli NOT DETECTED NOT DETECTED Final   Klebsiella aerogenes NOT DETECTED NOT DETECTED Final   Klebsiella oxytoca NOT DETECTED NOT DETECTED Final   Klebsiella pneumoniae NOT DETECTED NOT DETECTED Final   Proteus species NOT DETECTED NOT DETECTED Final   Salmonella species NOT DETECTED NOT DETECTED Final   Serratia marcescens NOT DETECTED NOT DETECTED Final   Haemophilus influenzae NOT DETECTED NOT DETECTED Final   Neisseria meningitidis NOT DETECTED NOT DETECTED Final   Pseudomonas aeruginosa NOT DETECTED NOT DETECTED Final   Stenotrophomonas maltophilia NOT DETECTED NOT DETECTED Final   Candida albicans NOT DETECTED NOT DETECTED Final   Candida auris NOT DETECTED NOT DETECTED Final   Candida glabrata NOT DETECTED NOT DETECTED Final   Candida krusei NOT DETECTED NOT DETECTED Final   Candida parapsilosis NOT DETECTED NOT DETECTED Final   Candida tropicalis NOT DETECTED NOT DETECTED Final   Cryptococcus neoformans/gattii NOT DETECTED NOT DETECTED Final    Meth resistant mecA/C and MREJ NOT DETECTED NOT DETECTED Final    Comment: Performed at [facility], [address], [place], [place] [address]  MRSA PCR Screening     Status: None   Collection Time: 09/23/20  5:05 PM   Specimen: Nasopharyngeal  Result Value Ref Range Status   MRSA by PCR NEGATIVE NEGATIVE Final    Comment:        The GeneXpert MRSA Assay (FDA approved for NASAL specimens only), is one component of a comprehensive MRSA colonization surveillance program. It is not intended  to diagnose MRSA infection nor to guide or monitor treatment for MRSA infections. Performed at [facility], [address] [place], [place] [address]   Urine culture     Status: None   Collection Time: 09/25/20  9:29 AM   Specimen: Urine, Clean Catch  Result Value Ref Range Status   Specimen Description   Final    URINE, CLEAN CATCH Performed at [facility], [address], [place], [place] [address]    Special Requests   Final    NONE Performed at [facility], [address] [place], [place] [address]    Culture   Final    NO GROWTH Performed at [facility], [place] [address], [place], [place] [address]    Report Status 09/26/2020 FINAL  Final  Culture, blood (routine x 2)     Status: None   Collection Time: 09/26/20  3:51 AM   Specimen: BLOOD RIGHT HAND  Result Value Ref Range Status   Specimen Description BLOOD RIGHT HAND  Final   Special Requests   Final    BOTTLES DRAWN AEROBIC AND ANAEROBIC Blood Culture adequate volume   Culture   Final    NO GROWTH 5 DAYS Performed at [facility], [address], [place], [place] [address]    Report Status 10/01/2020 FINAL  Final  Culture, blood (routine x 2)     Status: None   Collection Time: 09/26/20  3:58 AM   Specimen: BLOOD LEFT HAND  Result Value Ref Range Status   Specimen Description BLOOD LEFT HAND  Final   Special Requests   Final    BOTTLES DRAWN AEROBIC AND ANAEROBIC Blood Culture adequate volume   Culture    Final    NO GROWTH 5 DAYS Performed at [facility], [address], [place], [place] [address]    Report Status 10/01/2020 FINAL  Final     Radiology Studies: [place] EKG SITE RITE  Result Date: 10/01/2020 If Site Rite image not attached, placement could not be confirmed due to current cardiac rhythm.    Scheduled Meds:  calcium acetate  1,334 mg Oral TID WC   Chlorhexidine Gluconate Cloth  6 each Topical Q0600   FLUoxetine  10 mg Oral Daily   heparin  5,000 Units Subcutaneous Q8H   levothyroxine  175 mcg Oral Daily   oxyCODONE  10 mg Oral Q12H   polyethylene glycol  17 g Oral Daily   senna  1 tablet Oral QHS   sodium chloride flush  3 mL Intravenous Q12H   temazepam  15 mg Oral Once   Continuous Infusions:  sodium chloride 250 mL (09/27/20 1858)    ceFAZolin (ANCEF) IV 2 g (10/01/20 1238)     LOS: 8 days    Time spent: 35 mins    Barton Dubois, MD [facility]   If 7PM-7AM, please contact night-coverage [email or web address]  10/01/2020, 4:18 PM

## 2020-10-01 NOTE — NC FL2 (Signed)
[place] MEDICAID FL2 LEVEL OF CARE SCREENING TOOL     IDENTIFICATION  Patient Name: Tanya Wright Birthdate: [birth year] Sex: female Admission Date (Current Location): 09/23/2020  [facility] and [place] Number:  [employer] and Address:  [place] [address], [place]      Provider Number: [id number]  Attending Physician Name and Address:  Barton Dubois, MD  Relative Name and Phone Number:  Greg [place] (Mother)   [phone]    Current Level of Care: Hospital Recommended Level of Care: [place] Prior Approval Number:    Date Approved/Denied: 10/01/20 PASRR Number: [id number]  Discharge Plan: SNF    Current Diagnoses: Patient Active Problem List   Diagnosis Date Noted   Bacteremia 09/26/2020   Bilateral hydronephrosis 09/26/2020   Sepsis ([place]) 09/23/2020   Malnutrition of moderate degree 04/19/2017   AKI (acute kidney injury) ([place]) 04/16/2017   Abdominal pain 04/16/2017   Anemia in other chronic diseases classified elsewhere 03/14/2017   Anemia 03/14/2017   CKD (chronic kidney disease) stage 3, GFR 30-59 ml/min ([place]) 02/20/2017   UTI (urinary tract infection) 02/20/2017   Uterine cervix cancer ([place]) 02/19/2017   Thyroid mass of unclear etiology 02/19/2017   Cancer determined by uterine cervix biopsy ([place]) 02/19/2017    Orientation RESPIRATION BLADDER Height & Weight     Self, Time, Situation, Place  Normal Continent Weight: 190 lb 11.2 oz (86.5 kg) Height:  5\' 11"  (180.3 cm)  BEHAVIORAL SYMPTOMS/MOOD NEUROLOGICAL BOWEL NUTRITION STATUS      Continent Diet (Diet Heart Room service appropriate? Yes; Fluid consistency: Thin)  AMBULATORY STATUS COMMUNICATION OF NEEDS Skin   Limited Assist Verbally Normal                       Personal Care Assistance Level of Assistance  Bathing, Feeding, Dressing Bathing Assistance: Limited assistance Feeding assistance: Independent Dressing Assistance: Limited  assistance     Functional Limitations Info  Sight, Hearing, Speech Sight Info: Adequate Hearing Info: Adequate Speech Info: Adequate    SPECIAL CARE FACTORS FREQUENCY   (IV antibiotics)                    Contractures Contractures Info: Not present    Additional Factors Info  Code Status, Allergies Code Status Info: Full Allergies Info: Oxycodone           Current Medications (10/01/2020):  This is the current hospital active medication list Current Facility-Administered Medications  Medication Dose Route Frequency Provider Last Rate Last Admin   0.9 %  sodium chloride infusion  250 mL Intravenous PRN Skipper Cliche A, MD 10 mL/hr at 09/27/20 1858 250 mL at 09/27/20 1858   acetaminophen (TYLENOL) tablet 650 mg  650 mg Oral Q6H PRN Skipper Cliche A, MD   650 mg at 09/28/20 1841   Or   acetaminophen (TYLENOL) suppository 650 mg  650 mg Rectal Q6H PRN Skipper Cliche A, MD   650 mg at 09/29/20 0537   bisacodyl (DULCOLAX) EC tablet 5 mg  5 mg Oral Daily PRN Shahmehdi, Seyed A, MD       calcium acetate (PHOSLO) capsule 1,334 mg  1,334 mg Oral TID WC Shahmehdi, Seyed A, MD   1,334 mg at 10/01/20 1239   ceFAZolin (ANCEF) IVPB 2g/100 mL premix  2 g Intravenous Q8H Barton Dubois, MD 200 mL/hr at 10/01/20 1238 2 g at 10/01/20 1238   Chlorhexidine Gluconate Cloth 2 % PADS 6 each  6 each Topical Q0600 Deatra James, MD   6 each at 10/01/20 0611   FLUoxetine (PROZAC) capsule 10 mg  10 mg Oral Daily Shahmehdi, Seyed A, MD   10 mg at 10/01/20 0850   heparin injection 5,000 Units  5,000 Units Subcutaneous Q8H Shahmehdi, Seyed A, MD   5,000 Units at 10/01/20 1238   hydrALAZINE (APRESOLINE) injection 10 mg  10 mg Intravenous Q4H PRN Shahmehdi, Seyed A, MD       HYDROmorphone (DILAUDID) injection 0.5-1 mg  0.5-1 mg Intravenous Q2H PRN Shahmehdi, Seyed A, MD   1 mg at 10/01/20 1241   ipratropium (ATROVENT) nebulizer solution 0.5 mg  0.5 mg Nebulization Q6H PRN Shahmehdi, Seyed A,  MD       levalbuterol (XOPENEX) nebulizer solution 0.63 mg  0.63 mg Nebulization Q6H PRN Shahmehdi, Seyed A, MD       levothyroxine (SYNTHROID) tablet 175 mcg  175 mcg Oral Daily Shahmehdi, Seyed A, MD   175 mcg at 10/01/20 0534   magnesium citrate solution 1 Bottle  1 Bottle Oral Once PRN Shahmehdi, Erling Conte A, MD       metoprolol tartrate (LOPRESSOR) injection 5 mg  5 mg Intravenous Q4H PRN Shahmehdi, Seyed A, MD   5 mg at 09/24/20 1738   ondansetron (ZOFRAN) tablet 4 mg  4 mg Oral Q6H PRN Shahmehdi, Seyed A, MD   4 mg at 09/27/20 1119   Or   ondansetron (ZOFRAN) injection 4 mg  4 mg Intravenous Q6H PRN Shahmehdi, Seyed A, MD   4 mg at 10/01/20 0610   oxyCODONE (Oxy IR/ROXICODONE) immediate release tablet 5 mg  5 mg Oral Q4H PRN Shahmehdi, Seyed A, MD   5 mg at 09/29/20 1417   oxyCODONE (OXYCONTIN) 12 hr tablet 10 mg  10 mg Oral Q12H Hall, Scott A, RPH   10 mg at 10/01/20 0851   polyethylene glycol (MIRALAX / GLYCOLAX) packet 17 g  17 g Oral Daily Shahmehdi, Seyed A, MD   17 g at 10/01/20 0851   senna (SENOKOT) tablet 8.6 mg  1 tablet Oral QHS Shahmehdi, Seyed A, MD   8.6 mg at 09/30/20 2159   senna-docusate (Senokot-S) tablet 1 tablet  1 tablet Oral QHS PRN Shahmehdi, Seyed A, MD       sodium chloride flush (NS) 0.9 % injection 3 mL  3 mL Intravenous Q12H Shahmehdi, Seyed A, MD   3 mL at 10/01/20 0852   sodium chloride flush (NS) 0.9 % injection 3 mL  3 mL Intravenous PRN Shahmehdi, Seyed A, MD   3 mL at 09/28/20 2109   temazepam (RESTORIL) capsule 15 mg  15 mg Oral Once Adefeso, Oladapo, DO       traZODone (DESYREL) tablet 25 mg  25 mg Oral QHS PRN Skipper Cliche A, MD   25 mg at 09/25/20 2142     Discharge Medications: Please see discharge summary for a list of discharge medications.  Relevant Imaging Results:  Relevant Lab Results:   Additional Information PT SSN: [id number]  Natasha Bence, LCSW

## 2020-10-02 DIAGNOSIS — N10 Acute tubulo-interstitial nephritis: Secondary | ICD-10-CM

## 2020-10-02 LAB — BASIC METABOLIC PANEL
Anion gap: 7 (ref 5–15)
BUN: 20 mg/dL (ref 6–20)
CO2: 27 mmol/L (ref 22–32)
Calcium: 8.9 mg/dL (ref 8.9–10.3)
Chloride: 101 mmol/L (ref 98–111)
Creatinine, Ser: 2.32 mg/dL — ABNORMAL HIGH (ref 0.44–1.00)
GFR, Estimated: 24 mL/min — ABNORMAL LOW (ref >60)
Glucose, Bld: 100 mg/dL — ABNORMAL HIGH (ref 70–99)
Potassium: 5 mmol/L (ref 3.5–5.1)
Sodium: 135 mmol/L (ref 135–145)

## 2020-10-02 LAB — GLUCOSE, CAPILLARY: Glucose-Capillary: 88 mg/dL (ref 70–99)

## 2020-10-02 NOTE — Progress Notes (Addendum)
PROGRESS NOTE  Tanya Wright NWG:[id number] DOB: 09/14/1965 DOA: 09/23/2020 PCP: System, Provider Not In  HPI/Recap of past 56 hours: 55 year old female with history of cervical cancer status post radiation therapy and bilateral distal ureteral obstruction managed with bilateral ureteral stent.  She has Foley catheter.  She is followed up by urology. Patient seen and examined at bedside patient is currently on antibiotics he is being prepped to be discharged with home antibiotics she has a port that can be accessed so the order for PICC line would not be necessary to put in a PICC line.  Assessment/Plan: Principal Problem:   UTI (urinary tract infection) Active Problems:   Uterine cervix cancer (HCC)   CKD (chronic kidney disease) stage 3, GFR 30-59 ml/min (HCC)   Anemia in other chronic diseases classified elsewhere   AKI (acute kidney injury) ([place])   Sepsis ([place])   Bacteremia   Bilateral hydronephrosis  #1 sepsis due to MSSA bacteremia and urinary tract infection patient is currently afebrile. She was initially started on Rocephin that has been switched to Ancef at the recommendation of infectious disease. Continue cefazolin and transition to outpatient antibiotics through her port.  2.  Acute kidney injury on chronic kidney disease stage III they with bilateral hydronephrosis in the presence of double-J stent.  Continue to maintain adequate hydration.  Patient currently has a Foley catheter.  Patient will follow-up as outpatient with urology  3.  Stage IV cervical cancer she is followed up at [facility]  4.  Anemia of chronic disease no signs of bleeding we will continue to monitor  5.  Mild hypotension improved: Continue IV hydration  6.  Hypothyroidism: Continue home dose of Synthroid     Code Status: Full  Severity of Illness: The appropriate patient status for this patient is INPATIENT. Inpatient status is judged to be reasonable and necessary in order to provide the required  intensity of service to ensure the patient's safety. The patient's presenting symptoms, physical exam findings, and initial radiographic and laboratory data in the context of their chronic comorbidities is felt to place them at high risk for further clinical deterioration. Furthermore, it is not anticipated that the patient will be medically stable for discharge from the hospital within 2 midnights of admission. The following factors support the patient status of inpatient.   " T continues to need IV antibiotics  * I certify that at the point of admission it is my clinical judgment that the patient will require inpatient hospital care spanning beyond 2 midnights from the point of admission due to high intensity of service, high risk for further deterioration and high frequency of surveillance required.*   Family Communication: Patient  Disposition Plan: to SNF with IV antibiotics through her port   Consultants: Infectious disease Cardiology Urology  Procedures: None  Antimicrobials: Cefazolin  DVT prophylaxis: Heparin   Objective: Vitals:   10/01/20 2159 10/02/20 0610 10/02/20 1415 10/02/20 2011  BP: 94/71 98/74 (!) 91/54 109/76  Pulse: 71 80 77 70  Resp: 18 18 18 17   Temp: 98.8 F (37.1 C) 98.2 F (36.8 C) 98.6 F (37 C) 98.1 F (36.7 C)  TempSrc:  Oral Oral Oral  SpO2: 100% 98% 96% 97%  Weight:      Height:        Intake/Output Summary (Last 24 hours) at 10/02/2020 2310 Last data filed at 10/02/2020 2250 Gross per 24 hour  Intake 1443 ml  Output 2100 ml  Net -657 ml   [employer]  09/28/20 0500 09/29/20 0500 10/01/20 0500  Weight: 91 kg 89 kg 86.5 kg   Body mass index is 26.6 kg/m.  Exam:  General: 55 y.o. year-old female well developed well nourished in no acute distress.  Alert and oriented x3. Cardiovascular: Regular rate and rhythm with no rubs or gallops.  No thyromegaly or JVD noted.   Respiratory: Clear to auscultation with no wheezes or rales.  Good inspiratory effort. Abdomen: Soft nontender nondistended with normal bowel sounds x4 quadrants. Musculoskeletal: No lower extremity edema. 2/4 pulses in all 4 extremities. Skin: No ulcerative lesions noted or rashes, Psychiatry: Mood is appropriate for condition and setting    Data Reviewed: CBC: Recent Labs  Lab 09/26/20 0351 09/27/20 0439 09/28/20 0425 09/30/20 0429  WBC 5.4 4.8 4.6 6.0  HGB 9.3* 9.4* 9.2* 8.9*  HCT 29.1* 29.6* 27.7* 27.6*  MCV 96.7 99.0 95.2 97.2  PLT 133* 185 220 128   Basic Metabolic Panel: Recent Labs  Lab 09/27/20 0439 09/28/20 0425 09/30/20 0429 10/01/20 0745 10/02/20 0536  NA 139 137 137 136 135  K 4.2 4.2 4.8 4.9 5.0  CL 108 107 103 103 101  CO2 24 24 26 26 27   GLUCOSE 110* 94 86 105* 100*  BUN 28* 24* 20 20 20   CREATININE 2.85* 2.45* 2.37* 2.29* 2.32*  CALCIUM 7.5* 7.6* 7.7* 8.2* 8.9   GFR: Estimated Creatinine Clearance: 33 mL/min (A) (by C-G formula based on SCr of 2.32 mg/dL (H)). Liver Function Tests: No results for input(s): AST, ALT, ALKPHOS, BILITOT, PROT, ALBUMIN in the last 168 hours. No results for input(s): LIPASE, AMYLASE in the last 168 hours. No results for input(s): AMMONIA in the last 168 hours. Coagulation Profile: No results for input(s): INR, PROTIME in the last 168 hours. Cardiac Enzymes: No results for input(s): CKTOTAL, CKMB, CKMBINDEX, TROPONINI in the last 168 hours. BNP (last 3 results) No results for input(s): PROBNP in the last 8760 hours. HbA1C: No results for input(s): HGBA1C in the last 72 hours. CBG: Recent Labs  Lab 09/28/20 0732 09/29/20 0716 09/30/20 0717 10/01/20 0731 10/02/20 0730  GLUCAP 93 100* 94 102* 88   Lipid Profile: No results for input(s): CHOL, HDL, LDLCALC, TRIG, CHOLHDL, LDLDIRECT in the last 72 hours. Thyroid Function Tests: No results for input(s): TSH, T4TOTAL, FREET4, T3FREE, THYROIDAB in the last 72 hours. Anemia Panel: No results for input(s): VITAMINB12, FOLATE,  FERRITIN, TIBC, IRON, RETICCTPCT in the last 72 hours. Urine analysis:    Component Value Date/Time   COLORURINE YELLOW 09/23/2020 1123   APPEARANCEUR TURBID (A) 09/23/2020 1123   LABSPEC 1.013 09/23/2020 1123   PHURINE 7.0 09/23/2020 1123   GLUCOSEU NEGATIVE 09/23/2020 1123   HGBUR MODERATE (A) 09/23/2020 1123   BILIRUBINUR NEGATIVE 09/23/2020 1123   KETONESUR NEGATIVE 09/23/2020 1123   PROTEINUR >=300 (A) 09/23/2020 1123   NITRITE NEGATIVE 09/23/2020 1123   LEUKOCYTESUR LARGE (A) 09/23/2020 1123   Sepsis Labs: @LABRCNTIP (procalcitonin:4,lacticidven:4)  ) Recent Results (from the past 240 hour(s))  Resp Panel by RT-PCR (Flu A&B, Covid) Nasopharyngeal Swab     Status: None   Collection Time: 09/23/20 11:23 AM   Specimen: Nasopharyngeal Swab; Nasopharyngeal(NP) swabs in vial transport medium  Result Value Ref Range Status   SARS Coronavirus 2 by RT PCR NEGATIVE NEGATIVE Final    Comment: (NOTE) SARS-CoV-2 target nucleic acids are NOT DETECTED.  The SARS-CoV-2 RNA is generally detectable in upper respiratory specimens during the acute phase of infection. The lowest concentration of SARS-CoV-2 viral copies this  assay can detect is 138 copies/mL. A negative result does not preclude SARS-Cov-2 infection and should not be used as the sole basis for treatment or other patient management decisions. A negative result may occur with  improper specimen collection/handling, submission of specimen other than nasopharyngeal swab, presence of viral mutation(s) within the areas targeted by this assay, and inadequate number of viral copies(<138 copies/mL). A negative result must be combined with clinical observations, patient history, and epidemiological information. The expected result is Negative.  Fact Sheet for Patients:  EntrepreneurPulse.com.au  Fact Sheet for Healthcare Providers:  IncredibleEmployment.be  This test is no t yet approved or  cleared by the [place] FDA and  has been authorized for detection and/or diagnosis of SARS-CoV-2 by FDA under an Emergency Use Authorization (EUA). This EUA will remain  in effect (meaning this test can be used) for the duration of the COVID-19 declaration under Section 564(b)(1) of the Act, 21 U.S.C.section 360bbb-3(b)(1), unless the authorization is terminated  or revoked sooner.       Influenza A by PCR NEGATIVE NEGATIVE Final   Influenza B by PCR NEGATIVE NEGATIVE Final    Comment: (NOTE) The Xpert Xpress SARS-CoV-2/FLU/RSV plus assay is intended as an aid in the diagnosis of influenza from Nasopharyngeal swab specimens and should not be used as a sole basis for treatment. Nasal washings and aspirates are unacceptable for Xpert Xpress SARS-CoV-2/FLU/RSV testing.  Fact Sheet for Patients: EntrepreneurPulse.com.au  Fact Sheet for Healthcare Providers: IncredibleEmployment.be  This test is not yet approved or cleared by the [place] FDA and has been authorized for detection and/or diagnosis of SARS-CoV-2 by FDA under an Emergency Use Authorization (EUA). This EUA will remain in effect (meaning this test can be used) for the duration of the COVID-19 declaration under Section 564(b)(1) of the Act, 21 U.S.C. section 360bbb-3(b)(1), unless the authorization is terminated or revoked.  Performed at [facility], [address], [place], [place] [address]   Culture, blood (routine x 2)     Status: Abnormal   Collection Time: 09/23/20  4:09 PM   Specimen: BLOOD RIGHT FOREARM  Result Value Ref Range Status   Specimen Description   Final    BLOOD RIGHT FOREARM Performed at [facility], [address], [place], [place] [address]    Special Requests   Final    Blood Culture results may not be optimal due to an inadequate volume of blood received in culture bottles BOTTLES DRAWN AEROBIC AND ANAEROBIC Performed at [facility], [address] [place], [place] [address]    Culture  Setup Time   Final    GRAM POSITIVE COCCI BOTH AEROBIC AND ANAEROBIC BOTTLES Gram Stain Report Called to,Read Back By and Verified With: [facility] CAULDER  09/24/20 @ 1011 BY S BEARD CRITICAL RESULT CALLED TO, READ BACK BY AND VERIFIED WITH: Golden Pop RN 1700 09/24/20 A BROWNING Performed at [facility], [place] [address], [place],  [address]    Culture STAPHYLOCOCCUS AUREUS (A)  Final   Report Status 09/26/2020 FINAL  Final   Organism ID, Bacteria STAPHYLOCOCCUS AUREUS  Final      Susceptibility   Staphylococcus aureus - MIC*    CIPROFLOXACIN <=0.5 SENSITIVE Sensitive     ERYTHROMYCIN <=0.25 SENSITIVE Sensitive     GENTAMICIN <=0.5 SENSITIVE Sensitive     OXACILLIN <=0.25 SENSITIVE Sensitive     TETRACYCLINE <=1 SENSITIVE Sensitive     VANCOMYCIN 1 SENSITIVE Sensitive     TRIMETH/SULFA <=10 SENSITIVE Sensitive  CLINDAMYCIN <=0.25 SENSITIVE Sensitive     RIFAMPIN <=0.5 SENSITIVE Sensitive     Inducible Clindamycin NEGATIVE Sensitive     * STAPHYLOCOCCUS AUREUS  Culture, blood (routine x 2)     Status: None   Collection Time: 09/23/20  4:09 PM   Specimen: BLOOD  Result Value Ref Range Status   Specimen Description BLOOD LEFT ANTECUBITAL  Final   Special Requests   Final    Blood Culture adequate volume BOTTLES DRAWN AEROBIC AND ANAEROBIC   Culture   Final    NO GROWTH 5 DAYS Performed at [facility], [address] [place], [place] [address]    Report Status 09/28/2020 FINAL  Final  Blood Culture ID Panel (Reflexed)     Status: Abnormal   Collection Time: 09/23/20  4:09 PM  Result Value Ref Range Status   Enterococcus faecalis NOT DETECTED NOT DETECTED Final   Enterococcus Faecium NOT DETECTED NOT DETECTED Final   Listeria monocytogenes NOT DETECTED NOT DETECTED Final   Staphylococcus species DETECTED (A) NOT DETECTED Final    Comment: CRITICAL RESULT CALLED TO, READ BACK BY AND VERIFIED WITH: Golden Pop RN 1700 09/24/20 A  BROWNING    Staphylococcus aureus (BCID) DETECTED (A) NOT DETECTED Final    Comment: CRITICAL RESULT CALLED TO, READ BACK BY AND VERIFIED WITH: Golden Pop RN 1700 09/24/20 A BROWNING    Staphylococcus epidermidis NOT DETECTED NOT DETECTED Final   Staphylococcus lugdunensis NOT DETECTED NOT DETECTED Final   Streptococcus species NOT DETECTED NOT DETECTED Final   Streptococcus agalactiae NOT DETECTED NOT DETECTED Final   Streptococcus pneumoniae NOT DETECTED NOT DETECTED Final   Streptococcus pyogenes NOT DETECTED NOT DETECTED Final   A.calcoaceticus-baumannii NOT DETECTED NOT DETECTED Final   Bacteroides fragilis NOT DETECTED NOT DETECTED Final   Enterobacterales NOT DETECTED NOT DETECTED Final   Enterobacter cloacae complex NOT DETECTED NOT DETECTED Final   Escherichia coli NOT DETECTED NOT DETECTED Final   Klebsiella aerogenes NOT DETECTED NOT DETECTED Final   Klebsiella oxytoca NOT DETECTED NOT DETECTED Final   Klebsiella pneumoniae NOT DETECTED NOT DETECTED Final   Proteus species NOT DETECTED NOT DETECTED Final   Salmonella species NOT DETECTED NOT DETECTED Final   Serratia marcescens NOT DETECTED NOT DETECTED Final   Haemophilus influenzae NOT DETECTED NOT DETECTED Final   Neisseria meningitidis NOT DETECTED NOT DETECTED Final   Pseudomonas aeruginosa NOT DETECTED NOT DETECTED Final   Stenotrophomonas maltophilia NOT DETECTED NOT DETECTED Final   Candida albicans NOT DETECTED NOT DETECTED Final   Candida auris NOT DETECTED NOT DETECTED Final   Candida glabrata NOT DETECTED NOT DETECTED Final   Candida krusei NOT DETECTED NOT DETECTED Final   Candida parapsilosis NOT DETECTED NOT DETECTED Final   Candida tropicalis NOT DETECTED NOT DETECTED Final   Cryptococcus neoformans/gattii NOT DETECTED NOT DETECTED Final   Meth resistant mecA/C and MREJ NOT DETECTED NOT DETECTED Final    Comment: Performed at [facility], [address] [place], [place] [address]  MRSA PCR Screening      Status: None   Collection Time: 09/23/20  5:05 PM   Specimen: Nasopharyngeal  Result Value Ref Range Status   MRSA by PCR NEGATIVE NEGATIVE Final    Comment:        The GeneXpert MRSA Assay (FDA approved for NASAL specimens only), is one component of a comprehensive MRSA colonization surveillance program. It is not intended to diagnose MRSA infection nor to guide or monitor treatment for MRSA infections.  Performed at [facility], [address], [place], [place] [address]   Urine culture     Status: None   Collection Time: 09/25/20  9:29 AM   Specimen: Urine, Clean Catch  Result Value Ref Range Status   Specimen Description   Final    URINE, CLEAN CATCH Performed at [facility], [address], [place], [place] [address]    Special Requests   Final    NONE Performed at [facility], [address], [place], [place] [address]    Culture   Final    NO GROWTH Performed at [facility], [place] [address], [place], [place] [address]    Report Status 09/26/2020 FINAL  Final  Culture, blood (routine x 2)     Status: None   Collection Time: 09/26/20  3:51 AM   Specimen: BLOOD RIGHT HAND  Result Value Ref Range Status   Specimen Description BLOOD RIGHT HAND  Final   Special Requests   Final    BOTTLES DRAWN AEROBIC AND ANAEROBIC Blood Culture adequate volume   Culture   Final    NO GROWTH 5 DAYS Performed at [facility], [address], [place], [place] [address]    Report Status 10/01/2020 FINAL  Final  Culture, blood (routine x 2)     Status: None   Collection Time: 09/26/20  3:58 AM   Specimen: BLOOD LEFT HAND  Result Value Ref Range Status   Specimen Description BLOOD LEFT HAND  Final   Special Requests   Final    BOTTLES DRAWN AEROBIC AND ANAEROBIC Blood Culture adequate volume   Culture   Final    NO GROWTH 5 DAYS Performed at [facility], [address], [place], [place] [address]    Report Status 10/01/2020 FINAL  Final      Studies: No results  found.  Scheduled Meds:  calcium acetate  1,334 mg Oral TID WC   Chlorhexidine Gluconate Cloth  6 each Topical Q0600   FLUoxetine  10 mg Oral Daily   heparin  5,000 Units Subcutaneous Q8H   levothyroxine  175 mcg Oral Daily   oxyCODONE  10 mg Oral Q12H   polyethylene glycol  17 g Oral Daily   senna  1 tablet Oral QHS   sodium chloride flush  3 mL Intravenous Q12H   temazepam  15 mg Oral Once    Continuous Infusions:  sodium chloride 250 mL (09/27/20 1858)    ceFAZolin (ANCEF) IV 2 g (10/02/20 2132)     LOS: 9 days     Cristal Deer, MD [facility]  To reach me or the doctor on call, go to: [email or web address] Password [email or web address]  10/02/2020, 11:10 PM

## 2020-10-02 NOTE — Progress Notes (Addendum)
Spoke with [name] LPN re PICC order.  Pt has PAC, which can be used for OPAT, and recommended to be used.  [name] to d/w MD when rounds to clarify need for PICC vs PAC use.

## 2020-10-02 NOTE — Progress Notes (Signed)
Patient did not want dressings changed this morning. Secured tubes to her gown with clamps. Patient washed off this morning.

## 2020-10-03 LAB — GLUCOSE, CAPILLARY: Glucose-Capillary: 99 mg/dL (ref 70–99)

## 2020-10-03 NOTE — TOC Progression Note (Addendum)
Transition of Care [facility]) - Progression Note    Patient Details  Name: Darius Fillingim MRN: [id number] Date of Birth: [birth year]  Transition of [facility]) CM/SW Contact  Natasha Bence, LCSW Phone Number: 10/03/2020, 1:41 PM  Clinical Narrative:    CSW reviewed bed offers for patient. Patient reported that she did not want to discharge to [facility] due to stance from family. CSW informed patient of the barriers due to her chemo treatment with placement to [facility] SNF's. Patient reported that she is able to have her next chemo treatment moved in order to complete IV antibiotics and that she only has treatment every 6 weeks. CSW notified Ebony Hail with [facility] and LVM with Marianna Fuss from [facility] to request patient is reviewed based on next chemo treatment being delayed. Ebony Hail reported that if patient is able to get her next treatment moved. [facility] is able to take patient. Patient reported that she will follow up with her oncologist Monday to move next treatment. CSW started auth for patient. Patient agreeable to [facility] if an [facility] SNF is not able to make a bed offer. Tonya with [facility] reported that patient will need to have chemo postponed for SNF and that it will need to be documment. CSW notified that [facility] will follow up once decision is received from oncologist. TOC to follow.   Expected Discharge Plan: [place] Barriers to Discharge: Continued Medical Work up  Expected Discharge Plan and Services Expected Discharge Plan: [place] In-house Referral: Clinical Social Work     Living arrangements for the past 2 months: Single Family Home                                       Social Determinants of Health (SDOH) Interventions    Readmission Risk Interventions No flowsheet data found.

## 2020-10-03 NOTE — Progress Notes (Signed)
PROGRESS NOTE  Tanya Wright ZDG:[id number] DOB: 05-10-1965 DOA: 09/23/2020 PCP: System, Provider Not In  HPI/Recap of past 37 hours: 55 year old female with history of cervical cancer status post radiation therapy and bilateral distal ureteral obstruction managed with bilateral ureteral stent.  She has Foley catheter.  She is followed up by urology. Patient seen and examined at bedside patient is currently on antibiotics he is being prepped to be discharged with home antibiotics she has a port that can be accessed so the order for PICC line would not be necessary to put in a PICC line.   Update October 03, 2020: Patient seen and examined at bedside denies any new complaints she is in good spirit she is waiting to be discharged to the SNF because she cannot handle all of the IV antibiotics and staff at home    Assessment/Plan: Principal Problem:   UTI (urinary tract infection) Active Problems:   Uterine cervix cancer (HCC)   CKD (chronic kidney disease) stage 3, GFR 30-59 ml/min (HCC)   Anemia in other chronic diseases classified elsewhere   AKI (acute kidney injury) ([place])   Sepsis ([place])   Bacteremia   Bilateral hydronephrosis  #1 sepsis due to MSSA bacteremia and urinary tract infection patient is currently afebrile. She was initially started on Rocephin that has been switched to Ancef at the recommendation of infectious disease. Continue cefazolin and transition to outpatient antibiotics through her port.  2.  Acute kidney injury on chronic kidney disease stage III they with bilateral hydronephrosis in the presence of double-J stent.  Continue to maintain adequate hydration.  Patient currently has a Foley catheter.  Patient will follow-up as outpatient with urology  3.  Stage IV cervical cancer she is followed up at [facility]  4.  Anemia of chronic disease no signs of bleeding we will continue to monitor  5.  Mild hypotension improved: Continue IV hydration  6.  Hypothyroidism:  Continue home dose of Synthroid     Code Status: Full  Severity of Illness: The appropriate patient status for this patient is INPATIENT. Inpatient status is judged to be reasonable and necessary in order to provide the required intensity of service to ensure the patient's safety. The patient's presenting symptoms, physical exam findings, and initial radiographic and laboratory data in the context of their chronic comorbidities is felt to place them at high risk for further clinical deterioration. Furthermore, it is not anticipated that the patient will be medically stable for discharge from the hospital within 2 midnights of admission. The following factors support the patient status of inpatient.   " T continues to need IV antibiotics  * I certify that at the point of admission it is my clinical judgment that the patient will require inpatient hospital care spanning beyond 2 midnights from the point of admission due to high intensity of service, high risk for further deterioration and high frequency of surveillance required.*   Family Communication: Patient  Disposition Plan: to SNF with IV antibiotics through her port   Consultants: Infectious disease Cardiology Urology  Procedures: None  Antimicrobials: Cefazolin  DVT prophylaxis: Heparin   Objective: Vitals:   10/02/20 0610 10/02/20 1415 10/02/20 2011 10/03/20 0631  BP: 98/74 (!) 91/54 109/76 (!) 94/58  Pulse: 80 77 70 70  Resp: 18 18 17 15   Temp: 98.2 F (36.8 C) 98.6 F (37 C) 98.1 F (36.7 C) 97.7 F (36.5 C)  TempSrc: Oral Oral Oral Oral  SpO2: 98% 96% 97% 91%  Weight:  Height:        Intake/Output Summary (Last 24 hours) at 10/03/2020 1954 Last data filed at 10/03/2020 1700 Gross per 24 hour  Intake 1443 ml  Output 1750 ml  Net -307 ml    Filed Weights   09/28/20 0500 09/29/20 0500 10/01/20 0500  Weight: 91 kg 89 kg 86.5 kg   Body mass index is 26.6 kg/m.  Exam:  General: 55 y.o.  year-old female well developed well nourished in no acute distress.  Alert and oriented x3. Cardiovascular: Regular rate and rhythm with no rubs or gallops.  No thyromegaly or JVD noted.   Respiratory: Clear to auscultation with no wheezes or rales. Good inspiratory effort. Abdomen: Soft nontender nondistended with normal bowel sounds x4 quadrants. Musculoskeletal: No lower extremity edema. 2/4 pulses in all 4 extremities. Skin: No ulcerative lesions noted or rashes, Psychiatry: Mood is appropriate for condition and setting    Data Reviewed: CBC: Recent Labs  Lab 09/27/20 0439 09/28/20 0425 09/30/20 0429  WBC 4.8 4.6 6.0  HGB 9.4* 9.2* 8.9*  HCT 29.6* 27.7* 27.6*  MCV 99.0 95.2 97.2  PLT 185 220 237    Basic Metabolic Panel: Recent Labs  Lab 09/27/20 0439 09/28/20 0425 09/30/20 0429 10/01/20 0745 10/02/20 0536  NA 139 137 137 136 135  K 4.2 4.2 4.8 4.9 5.0  CL 108 107 103 103 101  CO2 24 24 26 26 27   GLUCOSE 110* 94 86 105* 100*  BUN 28* 24* 20 20 20   CREATININE 2.85* 2.45* 2.37* 2.29* 2.32*  CALCIUM 7.5* 7.6* 7.7* 8.2* 8.9    GFR: Estimated Creatinine Clearance: 33 mL/min (A) (by C-G formula based on SCr of 2.32 mg/dL (H)). Liver Function Tests: No results for input(s): AST, ALT, ALKPHOS, BILITOT, PROT, ALBUMIN in the last 168 hours. No results for input(s): LIPASE, AMYLASE in the last 168 hours. No results for input(s): AMMONIA in the last 168 hours. Coagulation Profile: No results for input(s): INR, PROTIME in the last 168 hours. Cardiac Enzymes: No results for input(s): CKTOTAL, CKMB, CKMBINDEX, TROPONINI in the last 168 hours. BNP (last 3 results) No results for input(s): PROBNP in the last 8760 hours. HbA1C: No results for input(s): HGBA1C in the last 72 hours. CBG: Recent Labs  Lab 09/29/20 0716 09/30/20 0717 10/01/20 0731 10/02/20 0730 10/03/20 0722  GLUCAP 100* 94 102* 88 99    Lipid Profile: No results for input(s): CHOL, HDL, LDLCALC,  TRIG, CHOLHDL, LDLDIRECT in the last 72 hours. Thyroid Function Tests: No results for input(s): TSH, T4TOTAL, FREET4, T3FREE, THYROIDAB in the last 72 hours. Anemia Panel: No results for input(s): VITAMINB12, FOLATE, FERRITIN, TIBC, IRON, RETICCTPCT in the last 72 hours. Urine analysis:    Component Value Date/Time   COLORURINE YELLOW 09/23/2020 1123   APPEARANCEUR TURBID (A) 09/23/2020 1123   LABSPEC 1.013 09/23/2020 1123   PHURINE 7.0 09/23/2020 1123   GLUCOSEU NEGATIVE 09/23/2020 1123   HGBUR MODERATE (A) 09/23/2020 [place] 09/23/2020 [place] 09/23/2020 1123   PROTEINUR >=300 (A) 09/23/2020 1123   NITRITE NEGATIVE 09/23/2020 1123   LEUKOCYTESUR LARGE (A) 09/23/2020 1123   Sepsis Labs: @LABRCNTIP (procalcitonin:4,lacticidven:4)  ) Recent Results (from the past 240 hour(s))  Urine culture     Status: None   Collection Time: 09/25/20  9:29 AM   Specimen: Urine, Clean Catch  Result Value Ref Range Status   Specimen Description   Final    URINE, CLEAN CATCH Performed at [facility],  [address], [place], [place] [address]    Special Requests   Final    NONE Performed at [facility], [address], [place], [place] [address]    Culture   Final    NO GROWTH Performed at [facility], [place] [address], [place], [place] [address]    Report Status 09/26/2020 FINAL  Final  Culture, blood (routine x 2)     Status: None   Collection Time: 09/26/20  3:51 AM   Specimen: BLOOD RIGHT HAND  Result Value Ref Range Status   Specimen Description BLOOD RIGHT HAND  Final   Special Requests   Final    BOTTLES DRAWN AEROBIC AND ANAEROBIC Blood Culture adequate volume   Culture   Final    NO GROWTH 5 DAYS Performed at [facility], [address], [place], [place] [address]    Report Status 10/01/2020 FINAL  Final  Culture, blood (routine x 2)     Status: None   Collection Time: 09/26/20  3:58 AM   Specimen: BLOOD LEFT HAND  Result Value  Ref Range Status   Specimen Description BLOOD LEFT HAND  Final   Special Requests   Final    BOTTLES DRAWN AEROBIC AND ANAEROBIC Blood Culture adequate volume   Culture   Final    NO GROWTH 5 DAYS Performed at [facility], [address], [place], [place] [address]    Report Status 10/01/2020 FINAL  Final      Studies: No results found.  Scheduled Meds:  calcium acetate  1,334 mg Oral TID WC   Chlorhexidine Gluconate Cloth  6 each Topical Q0600   FLUoxetine  10 mg Oral Daily   heparin  5,000 Units Subcutaneous Q8H   levothyroxine  175 mcg Oral Daily   oxyCODONE  10 mg Oral Q12H   polyethylene glycol  17 g Oral Daily   senna  1 tablet Oral QHS   sodium chloride flush  3 mL Intravenous Q12H   temazepam  15 mg Oral Once    Continuous Infusions:  sodium chloride 250 mL (09/27/20 1858)    ceFAZolin (ANCEF) IV 2 g (10/03/20 1327)     LOS: 10 days     Cristal Deer, MD [facility]  To reach me or the doctor on call, go to: [email or web address] Password [facility]  10/03/2020, 7:54 PM

## 2020-10-04 ENCOUNTER — Inpatient Hospital Stay
Admission: RE | Admit: 2020-10-04 | Discharge: 2020-10-26 | Disposition: A | Discharge status: Home / Self Care | Payer: 59 | Source: Ambulatory Visit | Attending: *Deleted | Admitting: *Deleted

## 2020-10-04 DIAGNOSIS — N1832 Chronic kidney disease, stage 3b: Secondary | ICD-10-CM

## 2020-10-04 LAB — GLUCOSE, CAPILLARY: Glucose-Capillary: 89 mg/dL (ref 70–99)

## 2020-10-04 LAB — CREATININE, SERUM
Creatinine, Ser: 2.6 mg/dL — ABNORMAL HIGH (ref 0.44–1.00)
GFR, Estimated: 21 mL/min — ABNORMAL LOW (ref >60)

## 2020-10-04 MED ORDER — OXYCODONE HCL 5 MG PO TABS: 5.0000 mg | ORAL_TABLET | Freq: Four times a day (QID) | ORAL | 0 refills | Status: DC | PRN

## 2020-10-04 MED ORDER — CEFAZOLIN IV (FOR PTA / DISCHARGE USE ONLY): 2.0000 g | Freq: Two times a day (BID) | INTRAVENOUS | 0 refills | Status: AC

## 2020-10-04 MED ORDER — CEFAZOLIN SODIUM-DEXTROSE 2-4 GM/100ML-% IV SOLN: 2.0000 g | Freq: Two times a day (BID) | INTRAVENOUS | Status: DC

## 2020-10-04 MED ORDER — OXYCODONE HCL ER 10 MG PO T12A: 10.0000 mg | EXTENDED_RELEASE_TABLET | Freq: Two times a day (BID) | ORAL | 0 refills | Status: DC

## 2020-10-04 MED ORDER — ACETAMINOPHEN 325 MG PO TABS: 650.0000 mg | ORAL_TABLET | Freq: Four times a day (QID) | ORAL | Status: AC | PRN

## 2020-10-04 MED ORDER — CEFAZOLIN SODIUM-DEXTROSE 2-4 GM/100ML-% IV SOLN
2.0000 g | Freq: Two times a day (BID) | INTRAVENOUS | Status: DC
  Administered 2020-10-04: 2 g via INTRAVENOUS
  Filled 2020-10-04: qty 100

## 2020-10-04 NOTE — Progress Notes (Signed)
PHARMACY NOTE:  ANTIMICROBIAL RENAL DOSAGE ADJUSTMENT  Current antimicrobial regimen includes a mismatch between antimicrobial dosage and estimated renal function.  As per policy approved by the Pharmacy & Therapeutics and Medical Executive Committees, the antimicrobial dosage will be adjusted accordingly.  Current antimicrobial dosage:  Cefepime 2000 mg IV every 8 hours.  Indication: bacteremia  Renal Function:  Estimated Creatinine Clearance: 29.3 mL/min (A) (by C-G formula based on SCr of 2.6 mg/dL (H)). []      On intermittent HD, scheduled: []      On CRRT    Antimicrobial dosage has been changed to:  Cefepime 2000 mg IV every 12 hours.  Additional comments:   Thank you for allowing pharmacy to be a part of this patient's care.  Ramond Craver, [facility] 10/04/2020 8:37 AM

## 2020-10-04 NOTE — Care Management Important Message (Signed)
Important Message  Patient Details  Name: Tanya Wright MRN: [id number] Date of Birth: [birth year]   Medicare Important Message Given:  Yes     Tommy Medal 10/04/2020, 1:12 PM

## 2020-10-04 NOTE — Progress Notes (Signed)
PHARMACY CONSULT NOTE FOR:  OUTPATIENT  PARENTERAL ANTIBIOTIC THERAPY (OPAT)  Indication: MSSA bacteremia  Regimen: Cefazolin 2000 mg IV every 12 hours End date: Last day of therapy 10/25/2020  IV antibiotic discharge orders are pended. To discharging provider:  please sign these orders via discharge navigator,  Select New Orders & click on the button choice - Manage This Unsigned Work.     Thank you for allowing pharmacy to be a part of this patient's care.  Ramond Craver 10/04/2020, 3:59 PM

## 2020-10-04 NOTE — TOC Transition Note (Signed)
Transition of Care [facility]) - CM/SW Discharge Note   Patient Details  Name: Tanya Wright MRN: [id number] Date of Birth: June 16, 1965  Transition of Care [facility]) CM/SW Contact:  Natasha Bence, LCSW Phone Number: 10/04/2020, 3:41 PM   Clinical Narrative:    CSW followed up with patient's oncologist to identify a day in which patient's chemo treatment could be rescheduled so that patient is able to participate in SNF for IV antibiotics. Mia with [facility] reported that patient's nexted appointment was able to be moved to July 8th. [facility] agreeable to take patient on 10/04/2020 after last IV antibiotics for the day have been administered. CSW received authorization for the patient.. Nurse to call report. [facility] to transfer patient. TOC signing off.    Final next level of care: Skilled Nursing Facility Barriers to Discharge: Barriers Resolved   Patient Goals and CMS Choice Patient states their goals for this hospitalization and ongoing recovery are:: Rehab with SNF CMS Medicare.gov Compare Post Acute Care list provided to:: Patient Choice offered to / list presented to : Patient  Discharge Placement              Patient chooses bed at: [facility] Patient to be transferred to facility by: [facility] Name of family member notified: Greg Cutter (Mother)   [phone] Patient and family notified of of transfer: 10/04/20  Discharge Plan and Services In-house Referral: Clinical Social Work                                   Social Determinants of Health ([place]) Interventions     Readmission Risk Interventions No flowsheet data found.

## 2020-10-04 NOTE — Discharge Summary (Signed)
Physician Discharge Summary  Tanya Wright HUD:[id number] DOB: 20-Nov-1965 DOA: 09/23/2020  PCP: System, Provider Not In  Admit date: 09/23/2020 Discharge date: 10/04/2020  Time spent: 35 minutes  Recommendations for Outpatient Follow-up:  Repeat basic metabolic panel in 1 week to follow electrolytes and renal function stability. Maintain adequate hydration    Discharge Diagnoses:  Principal Problem:   UTI (urinary tract infection) Active Problems:   Uterine cervix cancer (HCC)   CKD (chronic kidney disease) stage 3, GFR 30-59 ml/min (HCC)   Anemia in other chronic diseases classified elsewhere   AKI (acute kidney injury) ([place])   Sepsis ([place])   Bacteremia   Bilateral hydronephrosis   Discharge Condition: Discharge to skilled nursing facility for further care and rehabilitation.  Outpatient follow-up with urology and oncology service at [facility].  Code status: Full code  Diet recommendation: Heart healthy  Filed Weights   09/29/20 0500 10/01/20 0500 10/04/20 0500  Weight: 89 kg 86.5 kg 85.9 kg    History of present illness:  As per H&P written by Dr. Roger Shelter on 09/23/2020 Tanya Wright is a 55 y.o. female with medical history significant of stage IV cervical cancer on immunotherapy, HTN, hypothyroidism, chronic bilateral ureteral stent, with hydronephrosis presenting with chief complaint of fever, chills, headaches, congestion, body aches, stomach pain, nausea, vomiting.  1 episode of diarrhea nonbloody.   Is vaccinated for COVID and influenza. Nonspecific right-sided chest pain nonradiating was not associate with shortness of breath has resolved.   Patient Denies having: Cough, SOB, Chest Pain, abdominal pain, dizziness, lightheadedness,  Dysuria, Joint pain, rash, open wounds   ED Course:   Vitals: Temp 99.2, pulse 104, RR 23, BP 124/104, Abnormal labs; glucose 142, BUN 38, creatinine 3.16, calcium 8.2, CBC WBC 11.3, UA moderate hemoglobin, leukocyte [place], WBC>50 CT  Renal:  IMPRESSION: Revealing bilateral double-J ureteral stent with chronic bilateral hydronephrosis, distended ?  Obstruction, related to cervical cancer, posttreatment changes (concerning [place]) Chronic dense calcified retroperitoneal and pelvic lymph edema, aortic atherosclerosis Hospital Course:   Sepsis due to MSSA bacteremia and urinary tract infection -Patient noted to be febrile, tachycardic, with source of infection being urine and evidence of endorgan damage with acute kidney injury -Initially on Rocephin; then antibiotics switched to ANCEF as per ID rec's. -She did have blood cultures positive for MSSA. -Discussed with Dr. Juleen China and antibiotics changed to Ancef -repeat cultures sent 09/26/2020; no growth so far. -2D echo ordered, and demonstrated no vegetations.  Status post TEE demonstrating no vegetations as well. -Hemodynamically stable currently -Patient afebrile; patient has remained afebrile for 96 more hours and repeat blood culture has remained without any growth.  -antibiotics to be given through port A cath. Planning to treat until 10/25/20. Please follow OPAT orders.   Acute kidney injury on chronic kidney disease stage IIIb -Bilateral hydronephrosis with presence of double-J stents -Continue to maintain adequate hydration. -Status post percutaneous nephrostomy tube placement by IR on 09/28/2020.  -Baseline creatinine around 1.97 -Creatinine on admission noted to be 3.1; currently 2.6 -Continue to follow intermittently basic metabolic panels to assess renal function trend. -S/p bilateral percutaneous nephrostomy tube placement on 09/28/20 -Discussed with urology service, plan is to follow up as an Outpatient with urology service at [facility].   Stage IV cervical cancer -She is followed at [place] follow-up as an outpatient   Anemia of chronic disease -No signs of bleeding -Continue to follow hemoglobin trend.   Hypothyroidism -Continue home dose Synthroid    Chronic pain syndrome -Continued  home regimen of oxycodone and OxyContin -Pain is well controlled.   Depression -Continue on Prozac -Stable mood at discharge.    Procedures: TEE: No vegetation; valvular disorder. See below for x-ray reports PCN nephrotomy placement 09/28/20 2D echo: Normal ejection fraction; no wall motion abnormalities.  Consultations: Urology service Infectious disease (phone) Cardiology Interventional radiology  Discharge Exam: Vitals:   10/04/20 0900 10/04/20 1329  BP: (!) 90/59 92/66  Pulse: 72 80  Resp: 18 18  Temp: 98.1 F (36.7 C) 98.4 F (36.9 C)  SpO2: 99% 97%    General: Afebrile, no chest pain, no nausea, no vomiting; no acute distress.  Reports controlled intermittent flank pain. Cardiovascular: S1 and S2, no JVD; no rubs or gallops.  Respiratory: Clear to auscultation bilaterally; no using accessory muscles.  Good saturation on room air. Abdomen: Soft, nontender, distended, positive bowel sounds.  Percutaneous nephrostomy tube appreciated in flank areas. Extremities: No cyanosis or clubbing.  Discharge Instructions   Discharge Instructions     [facility] pharmacist to adjust dose for Vancomycin, Aminoglycosides and other anti-infective therapies as requested by physician.   Complete by: As directed    [facility] to provide Cath Flo 530m   Complete by: As directed    Administer for PICC line occlusion and as ordered by physician for other access device issues.   Anaphylaxis Kit: Provided to treat any anaphylactic reaction to the medication being provided to the patient if First Dose or when requested by physician   Complete by: As directed    Epinephrine 166mml vial / amp: Administer 0.30m66m0.30ml65mubcutaneously once for moderate to severe anaphylaxis, nurse to call physician and pharmacy when reaction occurs and call 911 if needed for immediate care   Diphenhydramine 50mg77mIV vial: Administer 25-50mg 41mM PRN  for first dose reaction, rash, itching, mild reaction, nurse to call physician and pharmacy when reaction occurs   Sodium Chloride 0.9% NS 500ml I39mdminister if needed for hypovolemic blood pressure drop or as ordered by physician after call to physician with anaphylactic reaction   Change dressing (specify)   Complete by: As directed    Dressing change: daily and as needed for soiling.   Change dressing on IV access line weekly and PRN   Complete by: As directed    Diet - low sodium heart healthy   Complete by: As directed    Discharge instructions   Complete by: As directed    Take medications as prescribed Maintain adequate hydration Complete antibiotic therapy as instructed (ancef twice a day until 10/26/2020). [facility]   Flush IV access with Sodium Chloride 0.9% and Heparin 10 units/ml or 100 units/ml   Complete by: As directed    Home infusion instructions - [facility]   Complete by: As directed    Instructions: Flush IV access with Sodium Chloride 0.9% and Heparin 10units/ml or 100units/ml   Change dressing on IV access line: Weekly and PRN   Instructions Cath Flo 2mg: Ad81mister for PICC Line occlusion and as ordered by physician for other access device   [facility] pharmacist to adjust dose for: Vancomycin, Aminoglycosides and other anti-infective therapies as requested by physician   Method of administration may be changed at the discretion of home infusion pharmacist based upon assessment of the patient and/or caregiver's ability to self-administer the medication ordered   Complete by: As directed       Allergies as of 10/04/2020  Reactions   Oxycodone Itching        Medication List     STOP taking these medications    oxyCODONE-acetaminophen 5-325 MG tablet Commonly known as: PERCOCET/ROXICET   penicillin v potassium 500 MG tablet Commonly known as: VEETID   SUMAtriptan 25 MG tablet Commonly  known as: IMITREX   Xtampza ER 18 MG C12a Generic drug: oxyCODONE ER       TAKE these medications    acetaminophen 325 MG tablet Commonly known as: TYLENOL Take 2 tablets (650 mg total) by mouth every 6 (six) hours as needed for mild pain (or Fever >/= 101).   calcium acetate 667 MG capsule Commonly known as: PHOSLO Take 1,334 mg by mouth 3 (three) times daily with meals.   ceFAZolin  IVPB Commonly known as: ANCEF Inject 2 g into the vein every 12 (twelve) hours for 21 days. Indication:  MSSA bacteremia First Dose: yes Last Day of Therapy:  10/25/2020 Labs - Once weekly:  CBC/D and BMP, Labs - Every other week:  ESR and CRP Method of administration: IV Push Method of administration may be changed at the discretion of home infusion pharmacist based upon assessment of the patient and/or caregiver's ability to self-administer the medication ordered. Start taking on: October 05, 2020   FLUoxetine 10 MG capsule Commonly known as: PROZAC Take 10 mg by mouth daily.   levothyroxine 175 MCG tablet Commonly known as: SYNTHROID Take 175 mcg by mouth daily.   oxyCODONE 5 MG immediate release tablet Commonly known as: Oxy IR/ROXICODONE Take 1 tablet (5 mg total) by mouth every 6 (six) hours as needed for severe pain. What changed:  when to take this reasons to take this   oxyCODONE 10 mg 12 hr tablet Commonly known as: OXYCONTIN Take 1 tablet (10 mg total) by mouth every 12 (twelve) hours. What changed: You were already taking a medication with the same name, and this prescription was added. Make sure you understand how and when to take each.   polyethylene glycol 17 g packet Commonly known as: MIRALAX / GLYCOLAX Take 17 g by mouth daily.   senna 8.6 MG tablet Commonly known as: SENOKOT Take 1 tablet by mouth at bedtime.               Discharge Care Instructions  (From admission, onward)           Start     Ordered   10/04/20 0000  Change dressing on IV access  line weekly and PRN  (Home infusion instructions - [facility] )        10/04/20 1631   10/04/20 0000  Change dressing (specify)       Comments: Dressing change: daily and as needed for soiling.   10/04/20 1627           Allergies  Allergen Reactions   Oxycodone Itching    Contact information for after-discharge care     [facility] .   Service: Skilled Nursing Contact information: [address] [place] [phone]                     The results of significant diagnostics from this hospitalization (including imaging, microbiology, ancillary and laboratory) are listed below for reference.    Significant Diagnostic Studies: US Renal  Result Date: 09/23/2020 CLINICAL DATA:  Worsening serum creatinine.  Bilateral renal stents EXAM: RENAL / URINARY TRACT  ULTRASOUND COMPLETE COMPARISON:  09/23/2020 FINDINGS: Right Kidney: Renal measurements: 12.8 x 5.0 x 5.1 cm = volume: 172 mL. Increased renal cortical echogenicity. Mild hydronephrosis. No mass or shadowing stone visualized. Left Kidney: Renal measurements: 12.5 x 6.5 x 5.4 cm = volume: 228 mL. Increased renal cortical echogenicity. Mild hydronephrosis. No mass or shadowing stone visualized. Bladder: Bilateral ureteral stents are present. Bladder appears otherwise unremarkable for the degree of distension. Other: None. IMPRESSION: 1. Mild bilateral hydronephrosis. Bilateral nephroureteral stents are in place, better evaluated on recently obtained CT. 2. Increased renal cortical echogenicity suggesting underlying medical renal disease. Electronically Signed   By: Davina Poke D.O.   On: 09/23/2020 15:04   DG Chest Portable 1 View  Result Date: 09/23/2020 CLINICAL DATA:  55 year old female with chest pain on the right. Fever, chills, headache. History of cervical cancer. EXAM: PORTABLE CHEST 1 VIEW COMPARISON:  Chest radiographs 04/16/2017.  FINDINGS: Portable AP upright view at 1114 hours. Stable right chest Port-A-Cath. Allowing for portable technique lung volumes and mediastinal contours remain within normal limits, lungs are clear. No pneumothorax. Interval thyroidectomy, with resolved rightward shift of the trachea at the thoracic inlet. No acute osseous abnormality identified. Paucity of bowel gas in the upper abdomen. IMPRESSION: No acute cardiopulmonary abnormality. Electronically Signed   By: Genevie Ann M.D.   On: 09/23/2020 11:21   ECHOCARDIOGRAM COMPLETE  Result Date: 09/26/2020    ECHOCARDIOGRAM REPORT   Patient Name:   Tanya Wright Date of Exam: 09/26/2020 Medical Rec #:  [id number]    Height:       69.0 in Accession #:    [id number]   Weight:       200.4 lb Date of Birth:  06/08/1965    BSA:          2.068 m Patient Age:    81 years     BP:           128/76 mmHg Patient Gender: F            HR:           89 bpm. Exam Location:  [facility] Procedure: 2D Echo, Cardiac Doppler and Color Doppler Indications:    Bacteremia R78.81  History:        Patient has prior history of Echocardiogram examinations, most                 recent 04/17/2017. Risk Factors:Hypertension. CKD (chronic                 kidney disease) Thyroid mass of unclear                 etiology. Uterine cervix cancer Patient.  Sonographer:    Alvino Chapel RCS Referring Phys: [place]  1. Left ventricular ejection fraction, by estimation, is 55 to 60%. The left ventricle has normal function. The left ventricle has no regional wall motion abnormalities. There is mild left ventricular hypertrophy. Left ventricular diastolic parameters were normal.  2. Right ventricular systolic function is normal. The right ventricular size is normal. There is normal pulmonary artery systolic pressure. The estimated right ventricular systolic pressure is 67.1 mmHg.  3. Left atrial size was mildly dilated.  4. There is a trivial pericardial effusion posterior to the left  ventricle.  5. The mitral valve is grossly normal. Mild mitral valve regurgitation.  6. The aortic valve is tricuspid. Aortic valve regurgitation is not visualized.  7. The inferior vena cava is dilated  in size with >50% respiratory variability, suggesting right atrial pressure of 8 mmHg.  8. No obvious valvular vegetations. FINDINGS  Left Ventricle: Left ventricular ejection fraction, by estimation, is 55 to 60%. The left ventricle has normal function. The left ventricle has no regional wall motion abnormalities. The left ventricular internal cavity size was normal in size. There is  mild left ventricular hypertrophy. Left ventricular diastolic parameters were normal. Right Ventricle: The right ventricular size is normal. No increase in right ventricular wall thickness. Right ventricular systolic function is normal. There is normal pulmonary artery systolic pressure. The tricuspid regurgitant velocity is 2.53 m/s, and  with an assumed right atrial pressure of 8 mmHg, the estimated right ventricular systolic pressure is 18.2 mmHg. Left Atrium: Left atrial size was mildly dilated. Right Atrium: Right atrial size was normal in size. Pericardium: Trivial pericardial effusion is present. The pericardial effusion is posterior to the left ventricle. Mitral Valve: The mitral valve is grossly normal. There is mild thickening of the mitral valve leaflet(s). Mild mitral valve regurgitation. Tricuspid Valve: The tricuspid valve is grossly normal. Tricuspid valve regurgitation is trivial. Aortic Valve: The aortic valve is tricuspid. Aortic valve regurgitation is not visualized. Pulmonic Valve: The pulmonic valve was grossly normal. Pulmonic valve regurgitation is trivial. Aorta: The aortic root is normal in size and structure. Venous: The inferior vena cava is dilated in size with greater than 50% respiratory variability, suggesting right atrial pressure of 8 mmHg. IAS/Shunts: No atrial level shunt detected by color flow  Doppler.  LEFT VENTRICLE PLAX 2D LVIDd:         4.81 cm  Diastology LVIDs:         3.33 cm  LV e' medial:    8.36 cm/s LV PW:         1.20 cm  LV E/e' medial:  11.6 LV IVS:        1.20 cm  LV e' lateral:   8.55 cm/s LVOT diam:     2.00 cm  LV E/e' lateral: 11.3 LV SV:         80 LV SV Index:   39 LVOT Area:     3.14 cm  RIGHT VENTRICLE RV S prime:     16.90 cm/s TAPSE (M-mode): 2.3 cm LEFT ATRIUM             Index       RIGHT ATRIUM           Index LA diam:        4.00 cm 1.93 cm/m  RA Area:     18.10 cm LA Vol (A2C):   71.1 ml 34.39 ml/m RA Volume:   43.10 ml  20.84 ml/m LA Vol (A4C):   83.7 ml 40.48 ml/m LA Biplane Vol: 78.4 ml 37.92 ml/m  AORTIC VALVE LVOT Vmax:   116.00 cm/s LVOT Vmean:  77.100 cm/s LVOT VTI:    0.254 m  AORTA Ao Root diam: 3.70 cm MITRAL VALVE                TRICUSPID VALVE MV Area (PHT): 3.30 cm     TR Peak grad:   25.6 mmHg MV Decel Time: 230 msec     TR Vmax:        253.00 cm/s MV E velocity: 96.80 cm/s MV A velocity: 113.00 cm/s  SHUNTS MV E/A ratio:  0.86         Systemic VTI:  0.25 m  Systemic Diam: 2.00 cm Rozann Lesches MD Electronically signed by Rozann Lesches MD Signature Date/Time: 09/26/2020/11:16:12 AM    Final    CT RENAL STONE STUDY  Result Date: 09/26/2020 CLINICAL DATA:  Hydronephrosis, history of cervical cancer status post radiation therapy, bilateral distal ureteral obstruction managed bilateral ureteral stents, acute kidney injury and hydronephrosis EXAM: CT ABDOMEN AND PELVIS WITHOUT CONTRAST TECHNIQUE: Multidetector CT imaging of the abdomen and pelvis was performed following the standard protocol without IV contrast. COMPARISON:  09/23/2020 FINDINGS: Lower chest: No acute abnormality. Hepatobiliary: No solid liver abnormality is seen. No gallstones, gallbladder wall thickening, or biliary dilatation. Pancreas: Unremarkable. No pancreatic ductal dilatation or surrounding inflammatory changes. Spleen: Normal in size without  significant abnormality. Adrenals/Urinary Tract: Adrenal glands are unremarkable. Bilateral double-J ureteral stents are positioned with formed pigtails in the renal pelves and urinary bladder. Foley catheter 5. There is moderate bilateral hydronephrosis, not significantly changed compared to prior CT. Bladder is unremarkable. Stomach/Bowel: Stomach is within normal limits. Appendix appears normal. No evidence of bowel wall thickening, distention, or inflammatory changes. Vascular/Lymphatic: Aortic atherosclerosis. There are numerous, enlarged, calcified retroperitoneal, bilateral iliac, and bilateral pelvic sidewall lymph nodes. Reproductive: Fluid-filled endometrial cavity. Other: No abdominal wall hernia or abnormality. No abdominopelvic ascites. Musculoskeletal: No acute or significant osseous findings. IMPRESSION: 1. Bilateral double-J ureteral stents are positioned with formed pigtails in the renal pelves and urinary bladder. There is moderate bilateral hydronephrosis, not significantly changed compared to prior CT. 2. There are numerous, enlarged, calcified retroperitoneal, bilateral iliac, and bilateral pelvic sidewall lymph nodes, consistent with treated nodal metastatic disease. 3. Fluid-filled endometrial cavity, likely related to endocervical obstruction in the setting of known malignancy. Aortic Atherosclerosis (ICD10-I70.0). Electronically Signed   By: Eddie Candle M.D.   On: 09/26/2020 13:12   CT Renal Stone Study  Result Date: 09/23/2020 CLINICAL DATA:  Right flank pain and hematuria for the past day. History of cervical cancer. EXAM: CT ABDOMEN AND PELVIS WITHOUT CONTRAST TECHNIQUE: Multidetector CT imaging of the abdomen and pelvis was performed following the standard protocol without IV contrast. COMPARISON:  CT abdomen pelvis dated February 04, 2019. FINDINGS: Lower chest: No acute abnormality. Hepatobiliary: No focal liver abnormality is seen. No gallstones, gallbladder wall thickening, or  biliary dilatation. Pancreas: Unchanged small calcifications in the pancreatic head consistent with chronic pancreatitis. No ductal dilatation or surrounding inflammatory changes. Spleen: Normal in size without focal abnormality. Adrenals/Urinary Tract: Adrenal glands are unremarkable. Bilateral double-J ureteral stents again noted and appropriately positioned. Chronic bilateral hydronephrosis, mildly improved compared to prior study. No renal calculi. Mild circumferential bladder wall thickening. Stomach/Bowel: Stomach is within normal limits. Appendix appears normal. No evidence of bowel wall thickening, distention, or inflammatory changes. Vascular/Lymphatic: Chronic densely calcified retroperitoneal and pelvic lymphadenopathy, similar to prior study. Aortic atherosclerosis. Reproductive: Improved but continued distension of the endometrial canal, which measures up to 1.9 cm (series 6, image 55), previously 3.6 cm. No adnexal mass. Other: Trace free fluid in the pelvis.  No pneumoperitoneum. Musculoskeletal: No acute or significant osseous findings. IMPRESSION: 1. No acute intra-abdominal process. 2. Bilateral double-J ureteral stents with chronic bilateral hydronephrosis, mildly improved compared to prior study. 3. Improved but continued distension of the endometrial canal, which measures up to 1.9 cm, previously 3.6 cm. This may reflect obstruction related to cervical cancer/post treatment changes. Consider pelvic ultrasound for further evaluation. 4. Chronic densely calcified retroperitoneal and pelvic lymphadenopathy, similar to prior study, consistent with treated metastatic disease. 5. Aortic Atherosclerosis (ICD10-I70.0). Electronically Signed   By:  Titus Dubin M.D.   On: 09/23/2020 13:13   ECHO TEE  Result Date: 09/29/2020    TRANSESOPHOGEAL ECHO REPORT   Patient Name:   Tanya Wright Date of Exam: 09/29/2020 Medical Rec #:  [id number]    Height:       71.0 in Accession #:    [id number]   Weight:        196.2 lb Date of Birth:  05-09-1965    BSA:          2.091 m Patient Age:    80 years     BP:           111/82 mmHg Patient Gender: F            HR:           80 bpm. [facility]am Location[facility] Procedure: Transesophageal Echo, Cardiac Doppler and Color Doppler Indications:    Bacteremia R78.81  History:        Patient has prior history of Echocardiogram examinations, most                 recent 09/26/2020. Risk Factors:Hypertension. CKD (chronic kidney                 disease) Thyroid mass of unclear etiology. Uterine cervix cancer                 Patient.  Sonographer:    Alvino Chapel RCS Referring Phys: [id number] SCOTT T WEAVER PROCEDURE: The transesophogeal probe was passed without difficulty through the esophogus of the patient. Sedation performed by different physician. The patient developed no complications during the procedure. IMPRESSIONS  1. No evidence of endocarditis.  2. Left ventricular ejection fraction, by estimation, is 60 to 65%. The left ventricle has normal function.  3. Right ventricular systolic function is normal. The right ventricular size is normal.  4. No left atrial/left atrial appendage thrombus was detected.  5. The mitral valve is normal in structure. Mild mitral valve regurgitation.  6. The aortic valve is normal in structure. Aortic valve regurgitation is trivial.  7. No shunt by color doppler. FINDINGS  Left Ventricle: Left ventricular ejection fraction, by estimation, is 60 to 65%. The left ventricle has normal function. The left ventricular internal cavity size was normal in size. Right Ventricle: The right ventricular size is normal. Right vetricular wall thickness was not assessed. Right ventricular systolic function is normal. Left Atrium: Left atrial size was normal in size. No left atrial/left atrial appendage thrombus was detected. Right Atrium: Right atrial size was normal in size. Pericardium: There is no evidence of pericardial effusion. Mitral Valve: The mitral valve is normal  in structure. Mild mitral valve regurgitation. Tricuspid Valve: The tricuspid valve is normal in structure. Tricuspid valve regurgitation is mild. Aortic Valve: The aortic valve is normal in structure. Aortic valve regurgitation is trivial. Pulmonic Valve: The pulmonic valve was grossly normal. Pulmonic valve regurgitation is not visualized. Aorta: The aortic root and ascending aorta are structurally normal, with no evidence of dilitation. IAS/Shunts: No shunt by color doppler. Dorris Carnes MD Electronically signed by Dorris Carnes MD Signature Date/Time: 09/29/2020/4:19:36 PM    Final    IR NEPHROSTOMY PLACEMENT LEFT  Result Date: 09/28/2020 INDICATION: 55 year old female with a history of hydronephrosis secondary to cervical cancer, with the baseline imaging diagnosis 02/19/2017. The patient has had bilateral ureteral stents in the past as a treatment, now with persisting hydronephrosis in the setting of double-J ureteral stents. EXAM: IR  NEPHROSTOMY PLACEMENT RIGHT; IR NEPHROSTOMY PLACEMENT LEFT COMPARISON:  CT 09/26/2020 MEDICATIONS: 2 g Rocephin IV ANESTHESIA/SEDATION: Fentanyl 100 mcg IV; Versed 3.0 mg IV Moderate Sedation Time:  30 minutes The patient was continuously monitored during the procedure by the interventional radiology nurse under my direct supervision. CONTRAST:  25m OMNIPAQUE IOHEXOL 300 MG/ML SOLN - administered into the collecting system(s) FLUOROSCOPY TIME:  Fluoroscopy Time: 3 minutes 30 seconds (114 mGy). COMPLICATIONS: None PROCEDURE: Informed written consent was obtained from the patient after a thorough discussion of the procedural risks, benefits and alternatives. All questions were addressed. Maximal Sterile Barrier Technique was utilized including caps, mask, sterile gowns, sterile gloves, sterile drape, hand hygiene and skin antiseptic. A timeout was performed prior to the initiation of the procedure. Patient positioned prone position on the fluoroscopy table. Ultrasound survey of the  bilateral flank was performed with images stored and sent to PACs. Left: The patient was then prepped and draped in the usual sterile fashion. 1% lidocaine was used to anesthetize the skin and subcutaneous tissues for local anesthesia. A Chiba needle was then used to access a posterior inferior calyx of the left kidney with ultrasound guidance. With spontaneous urine returned through the needle, passage of an 018 micro wire into the collecting system was performed under fluoroscopy. A small incision was made with an 11 blade scalpel, and the needle was removed from the wire. An GFairplaysystem was then advanced over the wire into the collecting system under fluoroscopy. The metal stiffener and inner dilator were removed, and then contrast confirmed location. Bentson wire was passed into the collecting system and the sheath removed. Ten French dilation of the soft tissues was performed. Using modified Seldinger technique, a 10 French pigtail catheter drain was placed over the Bentson wire. Wire and inner stiffener removed, and the pigtail was formed in the collecting system. Small amount of contrast confirmed position of the catheter. Right: 1% lidocaine was used to anesthetize the skin and subcutaneous tissues for local anesthesia. A Chiba needle was then used to access a posterior inferior calyx of the right kidney with ultrasound guidance. With spontaneous urine returned through the needle, passage of an 018 micro wire into the collecting system was performed under fluoroscopy. A small incision was made with an 11 blade scalpel, and the needle was removed from the wire. An GElcosystem was then advanced over the wire into the collecting system under fluoroscopy. The metal stiffener and inner dilator were removed. Contrast confirmed location. Bentson wire was passed into the collecting system and the sheath removed. Ten French dilation of the soft tissues was performed. Using modified Seldinger technique, a 10  French pigtail catheter drain was placed over the Bentson wire. Wire and inner stiffener removed, and the pigtail was formed in the collecting system. Small amount of contrast confirmed position of the catheter. Patient tolerated the procedure well and remained hemodynamically stable throughout. No complications were encountered and no significant blood loss encountered FINDINGS: Upon placement of the bilateral percutaneous nephrostomy, it is apparent that the patient has a relatively symmetric configuration of the kidney/collecting system, with bifurcated system. The lower pole moiety drains from the bilateral kidneys via a small short infundibulum into a relatively small renal pelvis bilaterally. The upper pole collecting system of the bilateral kidneys is drained via the position of the indwelling bilateral double-J ureteral stent. IMPRESSION: Status post bilateral percutaneous nephrostomy, with placement of 10 French drains into the collecting system of the lower pole moiety of the kidneys, as  above. The upper pole moiety of the bilateral kidneys are drained by the double-J ureteral stents and the lower pole moieties are drained via the percutaneous nephrostomy. Signed, Dulcy Fanny. Dellia Nims, RPVI [facility] Electronically Signed   By: Corrie Mckusick D.O.   On: 09/28/2020 15:11   IR NEPHROSTOMY PLACEMENT RIGHT  Result Date: 09/28/2020 INDICATION: 55 year old female with a history of hydronephrosis secondary to cervical cancer, with the baseline imaging diagnosis 02/19/2017. The patient has had bilateral ureteral stents in the past as a treatment, now with persisting hydronephrosis in the setting of double-J ureteral stents. EXAM: IR NEPHROSTOMY PLACEMENT RIGHT; IR NEPHROSTOMY PLACEMENT LEFT COMPARISON:  CT 09/26/2020 MEDICATIONS: 2 g Rocephin IV ANESTHESIA/SEDATION: Fentanyl 100 mcg IV; Versed 3.0 mg IV Moderate Sedation Time:  30 minutes The patient  was continuously monitored during the procedure by the interventional radiology nurse under my direct supervision. CONTRAST:  24m OMNIPAQUE IOHEXOL 300 MG/ML SOLN - administered into the collecting system(s) FLUOROSCOPY TIME:  Fluoroscopy Time: 3 minutes 30 seconds (114 mGy). COMPLICATIONS: None PROCEDURE: Informed written consent was obtained from the patient after a thorough discussion of the procedural risks, benefits and alternatives. All questions were addressed. Maximal Sterile Barrier Technique was utilized including caps, mask, sterile gowns, sterile gloves, sterile drape, hand hygiene and skin antiseptic. A timeout was performed prior to the initiation of the procedure. Patient positioned prone position on the fluoroscopy table. Ultrasound survey of the bilateral flank was performed with images stored and sent to PACs. Left: The patient was then prepped and draped in the usual sterile fashion. 1% lidocaine was used to anesthetize the skin and subcutaneous tissues for local anesthesia. A Chiba needle was then used to access a posterior inferior calyx of the left kidney with ultrasound guidance. With spontaneous urine returned through the needle, passage of an 018 micro wire into the collecting system was performed under fluoroscopy. A small incision was made with an 11 blade scalpel, and the needle was removed from the wire. An GRileysystem was then advanced over the wire into the collecting system under fluoroscopy. The metal stiffener and inner dilator were removed, and then contrast confirmed location. Bentson wire was passed into the collecting system and the sheath removed. Ten French dilation of the soft tissues was performed. Using modified Seldinger technique, a 10 French pigtail catheter drain was placed over the Bentson wire. Wire and inner stiffener removed, and the pigtail was formed in the collecting system. Small amount of contrast confirmed position of the catheter. Right: 1% lidocaine  was used to anesthetize the skin and subcutaneous tissues for local anesthesia. A Chiba needle was then used to access a posterior inferior calyx of the right kidney with ultrasound guidance. With spontaneous urine returned through the needle, passage of an 018 micro wire into the collecting system was performed under fluoroscopy. A small incision was made with an 11 blade scalpel, and the needle was removed from the wire. An GArchiesystem was then advanced over the wire into the collecting system under fluoroscopy. The metal stiffener and inner dilator were removed. Contrast confirmed location. Bentson wire was passed into the collecting system and the sheath removed. Ten French dilation of the soft tissues was performed. Using modified Seldinger technique, a 10 French pigtail catheter drain was placed over the Bentson wire. Wire and inner stiffener removed, and the pigtail was formed in the collecting system. Small amount of contrast confirmed position of the catheter. Patient tolerated the procedure well  and remained hemodynamically stable throughout. No complications were encountered and no significant blood loss encountered FINDINGS: Upon placement of the bilateral percutaneous nephrostomy, it is apparent that the patient has a relatively symmetric configuration of the kidney/collecting system, with bifurcated system. The lower pole moiety drains from the bilateral kidneys via a small short infundibulum into a relatively small renal pelvis bilaterally. The upper pole collecting system of the bilateral kidneys is drained via the position of the indwelling bilateral double-J ureteral stent. IMPRESSION: Status post bilateral percutaneous nephrostomy, with placement of 10 French drains into the collecting system of the lower pole moiety of the kidneys, as above. The upper pole moiety of the bilateral kidneys are drained by the double-J ureteral stents and the lower pole moieties are drained via the percutaneous  nephrostomy. Signed, Dulcy Fanny. Dellia Nims, RPVI [facility] Radiology Electronically Signed   By: Corrie Mckusick D.O.   On: 09/28/2020 15:11   [place] EKG SITE RITE  Result Date: 10/01/2020 If Site Rite image not attached, placement could not be confirmed due to current cardiac rhythm.   Microbiology: Recent Results (from the past 240 hour(s))  Urine culture     Status: None   Collection Time: 09/25/20  9:29 AM   Specimen: Urine, Clean Catch  Result Value Ref Range Status   Specimen Description   Final    URINE, CLEAN CATCH Performed at [facility], [address], [place], [place] [address]    Special Requests   Final    NONE Performed at [facility], [address], [place], [place] [address]    Culture   Final    NO GROWTH Performed at [facility], [address], [place], [place] [address]    Report Status 09/26/2020 FINAL  Final  Culture, blood (routine x 2)     Status: None   Collection Time: 09/26/20  3:51 AM   Specimen: BLOOD RIGHT HAND  Result Value Ref Range Status   Specimen Description BLOOD RIGHT HAND  Final   Special Requests   Final    BOTTLES DRAWN AEROBIC AND ANAEROBIC Blood Culture adequate volume   Culture   Final    NO GROWTH 5 DAYS Performed at [facility], [address], [place], [place] [address]    Report Status 10/01/2020 FINAL  Final  Culture, blood (routine x 2)     Status: None   Collection Time: 09/26/20  3:58 AM   Specimen: BLOOD LEFT HAND  Result Value Ref Range Status   Specimen Description BLOOD LEFT HAND  Final   Special Requests   Final    BOTTLES DRAWN AEROBIC AND ANAEROBIC Blood Culture adequate volume   Culture   Final    NO GROWTH 5 DAYS Performed at [facility], [address], [place], [place] [address]    Report Status 10/01/2020 FINAL  Final     Labs: Basic Metabolic Panel: Recent Labs  Lab 09/28/20 0425 09/30/20 0429 10/01/20 0745 10/02/20 0536 10/04/20 0417  NA  137 137 136 135  --   K 4.2 4.8 4.9 5.0  --   CL 107 103 103 101  --   CO2 _0 --   GLUCOSE 94 86 105* 100*  --   BUN 24* _1 --   CREATININE 2.45* 2.37* 2.29* 2.32* 2.60*  CALCIUM 7.6* 7.7* 8.2* 8.9  --    CBC: Recent Labs  Lab 09/28/20 0425 09/30/20 0429  WBC 4.6 6.0  HGB 9.2* 8.9*  HCT 27.7* 27.6*  MCV 95.2 97.2  PLT 220 317   BNP (last 3 results) Recent Labs    09/23/20 1132  BNP 86.0    CBG: Recent Labs  Lab 09/29/20 0716 09/30/20 0717 10/01/20 0731 10/02/20 0730 10/03/20 0722  GLUCAP 100* 94 102* 88 99    Signed:  Barton Dubois MD.  [facility] 10/04/2020, 4:32 PM

## 2020-10-04 NOTE — Progress Notes (Deleted)
PHARMACY CONSULT NOTE FOR:  OUTPATIENT  PARENTERAL ANTIBIOTIC THERAPY (OPAT)  Indication: MSSA bacteremia Regimen: Cefazolin 2000 mg IV every 12 hours. Monitor renal function for dose adjustment End date: October 26, 2020  IV antibiotic discharge orders are pended. To discharging provider:  please sign these orders via discharge navigator,  Select New Orders & click on the button choice - Manage This Unsigned Work.     Thank you for allowing pharmacy to be a part of this patient's care.  Ramond Craver 10/04/2020, 4:00 PM

## 2020-10-04 NOTE — Progress Notes (Signed)
Report called to [facility]. 

## 2020-10-05 ENCOUNTER — Other Ambulatory Visit (HOSPITAL_COMMUNITY)
Admission: RE | Admit: 2020-10-05 | Discharge: 2020-10-05 | Disposition: A | Discharge status: Home / Self Care | Payer: 59 | Source: Skilled Nursing Facility | Attending: Internal Medicine | Admitting: Internal Medicine

## 2020-10-05 LAB — CBC WITH DIFFERENTIAL/PLATELET
Abs Immature Granulocytes: 0.09 10*3/uL — ABNORMAL HIGH (ref 0.00–0.07)
Basophils Absolute: 0 10*3/uL (ref 0.0–0.1)
Basophils Relative: 1 %
Eosinophils Absolute: 0.1 10*3/uL (ref 0.0–0.5)
Eosinophils Relative: 3 %
HCT: 34 % — ABNORMAL LOW (ref 36.0–46.0)
Hemoglobin: 11 g/dL — ABNORMAL LOW (ref 12.0–15.0)
Immature Granulocytes: 2 %
Lymphocytes Relative: 27 %
Lymphs Abs: 1.3 10*3/uL (ref 0.7–4.0)
MCH: 31.6 pg (ref 26.0–34.0)
MCHC: 32.4 g/dL (ref 30.0–36.0)
MCV: 97.7 fL (ref 80.0–100.0)
Monocytes Absolute: 0.4 10*3/uL (ref 0.1–1.0)
Monocytes Relative: 8 %
Neutro Abs: 2.9 10*3/uL (ref 1.7–7.7)
Neutrophils Relative %: 59 %
Platelets: 492 10*3/uL — ABNORMAL HIGH (ref 150–400)
RBC: 3.48 MIL/uL — ABNORMAL LOW (ref 3.87–5.11)
RDW: 15.4 % (ref 11.5–15.5)
WBC: 4.8 10*3/uL (ref 4.0–10.5)
nRBC: 0 % (ref 0.0–0.2)

## 2020-10-05 LAB — BASIC METABOLIC PANEL
Anion gap: 9 (ref 5–15)
BUN: 36 mg/dL — ABNORMAL HIGH (ref 6–20)
CO2: 26 mmol/L (ref 22–32)
Calcium: 9.8 mg/dL (ref 8.9–10.3)
Chloride: 99 mmol/L (ref 98–111)
Creatinine, Ser: 2.7 mg/dL — ABNORMAL HIGH (ref 0.44–1.00)
GFR, Estimated: 20 mL/min — ABNORMAL LOW (ref >60)
Glucose, Bld: 92 mg/dL (ref 70–99)
Potassium: 5.6 mmol/L — ABNORMAL HIGH (ref 3.5–5.1)
Sodium: 134 mmol/L — ABNORMAL LOW (ref 135–145)

## 2020-10-05 LAB — SEDIMENTATION RATE: Sed Rate: 119 mm/hr — ABNORMAL HIGH (ref 0–22)

## 2020-10-05 LAB — C-REACTIVE PROTEIN: CRP: 5.2 mg/dL — ABNORMAL HIGH (ref <1.0)

## 2020-10-11 ENCOUNTER — Encounter: Payer: Self-pay | Admitting: Adult Health

## 2020-10-11 ENCOUNTER — Non-Acute Institutional Stay (SKILLED_NURSING_FACILITY): Payer: 59 | Admitting: Adult Health

## 2020-10-11 ENCOUNTER — Other Ambulatory Visit: Payer: Self-pay | Admitting: Adult Health

## 2020-10-11 DIAGNOSIS — N3000 Acute cystitis without hematuria: Secondary | ICD-10-CM | POA: Diagnosis not present

## 2020-10-11 DIAGNOSIS — A4101 Sepsis due to Methicillin susceptible Staphylococcus aureus: Secondary | ICD-10-CM

## 2020-10-11 DIAGNOSIS — N1330 Unspecified hydronephrosis: Secondary | ICD-10-CM | POA: Diagnosis not present

## 2020-10-11 DIAGNOSIS — N1832 Chronic kidney disease, stage 3b: Secondary | ICD-10-CM | POA: Diagnosis not present

## 2020-10-11 DIAGNOSIS — R6520 Severe sepsis without septic shock: Secondary | ICD-10-CM

## 2020-10-11 DIAGNOSIS — I700 Atherosclerosis of aorta: Secondary | ICD-10-CM

## 2020-10-11 DIAGNOSIS — F339 Major depressive disorder, recurrent, unspecified: Secondary | ICD-10-CM

## 2020-10-11 DIAGNOSIS — E039 Hypothyroidism, unspecified: Secondary | ICD-10-CM

## 2020-10-11 DIAGNOSIS — C539 Malignant neoplasm of cervix uteri, unspecified: Secondary | ICD-10-CM

## 2020-10-11 DIAGNOSIS — D638 Anemia in other chronic diseases classified elsewhere: Secondary | ICD-10-CM

## 2020-10-11 DIAGNOSIS — K5909 Other constipation: Secondary | ICD-10-CM

## 2020-10-11 MED ORDER — OXYCODONE HCL 5 MG PO TABS: 5.0000 mg | ORAL_TABLET | Freq: Four times a day (QID) | ORAL | 0 refills | Status: DC | PRN

## 2020-10-11 MED ORDER — OXYCODONE HCL ER 10 MG PO T12A: 10.0000 mg | EXTENDED_RELEASE_TABLET | Freq: Two times a day (BID) | ORAL | 0 refills | Status: DC

## 2020-10-11 NOTE — Progress Notes (Signed)
Location:  [facility] Room Number: 143-P Place of Service:  SNF (31)   CODE STATUS: Unknown  Allergies  Allergen Reactions  . Oxycodone Itching    Chief Complaint  Patient presents with  . Hospitalization Follow-up    HPI:  She is a 55 year old woman who has been hospitalized from 09-23-20 through 10-04-20. Her medical history includes: stage 4 cervical cancer on immunotherapy. Hypertension; ckd; hypothyroidism. She presented to the ED with complaints of fever;chills; nausea vomiting headache. She was treated for sepsis due to MSSA bacteremia and UTI. She was initially started on rocephin and changed to Ancef. Did speak with Dr. Juleen China. She does have a portacath. She will need IV ancef through 10-25-20. She was treated for acute on chronic stage 3 CKD. She had bilateral nephrostomy stents placed on 09-28-20. She is here for short term rehab and IV therapy. Her goal is to return back home. She does not want to continue oxycodone states that this medication causes significant constipation. No changes in appetite. She will continue to be followed for her chronic illnesses including: stage 3b chronic kidney disease / bilateral hydronephrosis:    Malignant neoplasm of cervix unspecified site: Aortic atherosclerosis  Hypothyroidism unspecified type    Past Medical History:  Diagnosis Date  . Cancer ([place])   . Cervical cancer ([place])   . Hypertension   . Renal disorder   . Thyroid disease     Past Surgical History:  Procedure Laterality Date  . IR NEPHROSTOMY PLACEMENT LEFT  09/28/2020  . IR NEPHROSTOMY PLACEMENT RIGHT  09/28/2020  . STENT PLACEMENT RT URETER ([place] [facility])     sent in both kidneys for stones  . TEE WITHOUT CARDIOVERSION N/A 09/29/2020   Procedure: TRANSESOPHAGEAL ECHOCARDIOGRAM (TEE) WITH PROPOFOL;  Surgeon: Fay Records, MD;  Location: [facility];  Service: Cardiovascular;  Laterality: N/A;  . TUBAL LIGATION      Social History   Socioeconomic History  .  Marital status: Divorced    Spouse name: Not on file  . Number of children: Not on file  . Years of education: Not on file  . Highest education level: Not on file  Occupational History  . Not on file  Tobacco Use  . Smoking status: Former    Pack years: 0.00    Types: Cigarettes  . Smokeless tobacco: Never  Substance and Sexual Activity  . Alcohol use: Yes  . Drug use: Yes    Types: Marijuana  . Sexual activity: Yes  Other Topics Concern  . Not on file  Social History Narrative  . Not on file   Social Determinants of Health   Financial Resource Strain: Not on file  Food Insecurity: Not on file  Transportation Needs: Not on file  Physical Activity: Not on file  Stress: Not on file  Social Connections: Not on file  Intimate Partner Violence: Not on file   History reviewed. No pertinent family history.    VITAL SIGNS BP 102/60   Pulse 86   Temp 98.1 F (36.7 C)   Resp 20   Ht 5' 11"  (1.803 m)   LMP 02/28/2017   SpO2 97%   BMI 26.41 kg/m   Outpatient Encounter Medications as of 10/11/2020  Medication Sig  . acetaminophen (TYLENOL) 325 MG tablet Take 2 tablets (650 mg total) by mouth every 6 (six) hours as needed for mild pain (or Fever >/= 101).  . calcium acetate (PHOSLO) 667 MG capsule Take 1,334 mg  by mouth 3 (three) times daily with meals.  Marland Kitchen ceFAZolin (ANCEF) IVPB Inject 2 g into the vein every 12 (twelve) hours for 21 days. Indication:  MSSA bacteremia First Dose: yes Last Day of Therapy:  10/25/2020 Labs - Once weekly:  CBC/D and BMP, Labs - Every other week:  ESR and CRP Method of administration: IV Push Method of administration may be changed at the discretion of home infusion pharmacist based upon assessment of the patient and/or caregiver's ability to self-administer the medication ordered.  Marland Kitchen levothyroxine (SYNTHROID) 175 MCG tablet Take 175 mcg by mouth daily.  Marland Kitchen oxyCODONE (OXY IR/ROXICODONE) 5 MG immediate release tablet Take 1 tablet (5 mg total)  by mouth every 6 (six) hours as needed for severe pain. (Patient taking differently: Take 5 mg by mouth every 4 (four) hours as needed for severe pain.)  . polyethylene glycol (MIRALAX / GLYCOLAX) packet Take 17 g by mouth daily.  Marland Kitchen senna (SENOKOT) 8.6 MG tablet Take 1 tablet by mouth at bedtime.  Marland Kitchen FLUoxetine (PROZAC) 10 MG capsule Take 10 mg by mouth daily.  . [DISCONTINUED] oxyCODONE (OXYCONTIN) 10 mg 12 hr tablet Take 1 tablet (10 mg total) by mouth every 12 (twelve) hours.   No facility-administered encounter medications on file as of 10/11/2020.     SIGNIFICANT DIAGNOSTIC EXAMS  TODAY  09-23-20: chest x-ray: No acute cardiopulmonary abnormality   09-23-20: renal stone study:  1. No acute intra-abdominal process. 2. Bilateral double-J ureteral stents with chronic bilateral hydronephrosis, mildly improved compared to prior study. 3. Improved but continued distension of the endometrial canal, which measures up to 1.9 cm, previously 3.6 cm. This may reflect obstruction related to cervical cancer/post treatment changes. Consider pelvic ultrasound for further evaluation. 4. Chronic densely calcified retroperitoneal and pelvic lymphadenopathy, similar to prior study, consistent with treated metastatic disease. 5. Aortic Atherosclerosis  LABS REVIEWED;   09-23-20: wbc 11.3; hgb 12.4; hct 37.6; mcv 97.4 plt 171; glucose 142; bun 38; creat 3.16; k+ 3.7; na++ 132; ca 8.2 GFR 17 10-05-20: wbc 4.8; hgb 11.0; hct 34.0; mcv 97.7 plt 492; glucose 92; bun 36; creat 2.70; k+ 5.6; na++ 134; ca 9.8 GFR 20   Review of Systems  Constitutional:  Negative for malaise/fatigue.  Respiratory:  Negative for cough and shortness of breath.   Cardiovascular:  Negative for chest pain, palpitations and leg swelling.  Gastrointestinal:  Negative for abdominal pain, constipation and heartburn.  Musculoskeletal:  Negative for back pain, joint pain and myalgias.  Skin: Negative.   Neurological:  Negative for dizziness.   Psychiatric/Behavioral:  The patient is not nervous/anxious.     Physical Exam Constitutional:      General: She is not in acute distress.    Appearance: She is well-developed. She is not diaphoretic.  Neck:     Thyroid: No thyromegaly.  Cardiovascular:     Rate and Rhythm: Normal rate and regular rhythm.     Heart sounds: Normal heart sounds.  Pulmonary:     Effort: Pulmonary effort is normal. No respiratory distress.     Breath sounds: Normal breath sounds.  Abdominal:     General: Bowel sounds are normal. There is no distension.     Palpations: Abdomen is soft.     Tenderness: There is no abdominal tenderness.  Musculoskeletal:        General: Normal range of motion.     Cervical back: Neck supple.     Right lower leg: No edema.     Left  lower leg: No edema.  Lymphadenopathy:     Cervical: No cervical adenopathy.  Skin:    General: Skin is warm and dry.  Neurological:     Mental Status: She is alert and oriented to person, place, and time.      ASSESSMENT/ PLAN:   TODAY  Sepsis due to MSSA with acute organ dysfunction with out septic shock: acute cystitis without septic shock: is stable is on ancef 1 gm twice daily through 10-25-20  2. Stage 3b chronic kidney disease / bilateral hydronephrosis: is stratus post bilateral nephrostomy stent 09-28-20; will continue phoslo 1334 mg with meals  3. Malignant neoplasm of cervix unspecified site: is stage 4; on immunotherapy. [facility]ed at [facility]  4. Aortic atherosclerosis (09-23-20 ct)  5. Hypothyroidism unspecified type: is stable will continue synthroid 175 mcg daily   6. Chronic constipation: is stable will continue miralax daily ans senna nightly   7. Anemia in other chronic diseases classified elsewhere is stable hgb 11.0  8. Major depression chronic recurrent: is stable will continue prozac 10 mg daily   9. Chronic pain syndrome: is stable will stop oxycodone will continue tylenol   Time spent with patient 45 minutes:  medication reconciliation; therapy needs; goals of care.    Ok Edwards NP [facility]  Contact [phone] Monday through Friday 8am- 5pm  After hours call [phone]

## 2020-10-12 ENCOUNTER — Other Ambulatory Visit (HOSPITAL_COMMUNITY)
Admission: RE | Admit: 2020-10-12 | Discharge: 2020-10-12 | Disposition: A | Discharge status: Home / Self Care | Payer: 59 | Source: Skilled Nursing Facility | Attending: Internal Medicine | Admitting: Internal Medicine

## 2020-10-12 ENCOUNTER — Non-Acute Institutional Stay (SKILLED_NURSING_FACILITY): Payer: 59 | Admitting: Internal Medicine

## 2020-10-12 ENCOUNTER — Encounter: Payer: Self-pay | Admitting: Internal Medicine

## 2020-10-12 DIAGNOSIS — A4101 Sepsis due to Methicillin susceptible Staphylococcus aureus: Secondary | ICD-10-CM | POA: Diagnosis not present

## 2020-10-12 DIAGNOSIS — N1832 Chronic kidney disease, stage 3b: Secondary | ICD-10-CM

## 2020-10-12 DIAGNOSIS — R6520 Severe sepsis without septic shock: Secondary | ICD-10-CM | POA: Diagnosis not present

## 2020-10-12 DIAGNOSIS — N179 Acute kidney failure, unspecified: Secondary | ICD-10-CM | POA: Diagnosis not present

## 2020-10-12 LAB — CBC WITH DIFFERENTIAL/PLATELET
Abs Immature Granulocytes: 0.01 10*3/uL (ref 0.00–0.07)
Basophils Absolute: 0 10*3/uL (ref 0.0–0.1)
Basophils Relative: 1 %
Eosinophils Absolute: 0.2 10*3/uL (ref 0.0–0.5)
Eosinophils Relative: 5 %
HCT: 32.4 % — ABNORMAL LOW (ref 36.0–46.0)
Hemoglobin: 10.4 g/dL — ABNORMAL LOW (ref 12.0–15.0)
Immature Granulocytes: 0 %
Lymphocytes Relative: 34 %
Lymphs Abs: 1.2 10*3/uL (ref 0.7–4.0)
MCH: 31.7 pg (ref 26.0–34.0)
MCHC: 32.1 g/dL (ref 30.0–36.0)
MCV: 98.8 fL (ref 80.0–100.0)
Monocytes Absolute: 0.4 10*3/uL (ref 0.1–1.0)
Monocytes Relative: 10 %
Neutro Abs: 1.8 10*3/uL (ref 1.7–7.7)
Neutrophils Relative %: 50 %
Platelets: 324 10*3/uL (ref 150–400)
RBC: 3.28 MIL/uL — ABNORMAL LOW (ref 3.87–5.11)
RDW: 14.6 % (ref 11.5–15.5)
WBC: 3.5 10*3/uL — ABNORMAL LOW (ref 4.0–10.5)
nRBC: 0 % (ref 0.0–0.2)

## 2020-10-12 LAB — BASIC METABOLIC PANEL
Anion gap: 7 (ref 5–15)
BUN: 45 mg/dL — ABNORMAL HIGH (ref 6–20)
CO2: 24 mmol/L (ref 22–32)
Calcium: 8.7 mg/dL — ABNORMAL LOW (ref 8.9–10.3)
Chloride: 106 mmol/L (ref 98–111)
Creatinine, Ser: 2.16 mg/dL — ABNORMAL HIGH (ref 0.44–1.00)
GFR, Estimated: 26 mL/min — ABNORMAL LOW (ref >60)
Glucose, Bld: 93 mg/dL (ref 70–99)
Potassium: 5 mmol/L (ref 3.5–5.1)
Sodium: 137 mmol/L (ref 135–145)

## 2020-10-12 NOTE — Progress Notes (Signed)
NURSING HOME LOCATION:  [facility] ROOM NUMBER:  143 P  CODE STATUS:    PCP:  no PCP  This is a comprehensive admission note to this SNFperformed on this date less than 30 days from date of admission. Included are preadmission medical/surgical history; reconciled medication list; family history; social history and comprehensive review of systems.  Corrections and additions to the records were documented. Comprehensive physical exam was also performed. Additionally a clinical summary was entered for each active diagnosis pertinent to this admission in the Problem List to enhance continuity of care.  HPI: She was hospitalized 6/2 - 10/04/2020 presenting with a chief complaint of fever, chills, headaches, chest congestion, myalgias/arthralgias, abdominal pain, nausea, vomiting, and one episode of nonbloody diarrhea. In the ED temp was 99.2 pulse 104 respiratory rate 23.  Diastolic blood pressure was elevated to 104.  Abnormal labs included glucose of 142, BUN of 38, creatinine 3.16, calcium 8.2, and white count of 11,300.  She was felt to have AKI superimposed on CKD stage IIIb.  Baseline creatinine was felt to be 1.97.  Urinalysis revealed large leukocytes and greater than 50 WBCs.  CT renal revealed bilateral double-J ureteral stents with chronic bilateral hydronephrosis with possible obstruction.  This is in the context of stage IV cervical cancer on immunotherapy.  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 Initially Rocephin was initiated but changed to Ancef as per ID consultant.  Blood cultures were positive for MSSA.  Repeat cultures 6/5 revealed no growth. She remained afebrile for 96 hours pre predischarge. Echo and TEE revealed no vegetations. Bilateral percutaneous nephrostomy tube placement was performed 6/7.  Urology outpatient follow-up will be through the Urology service at [facility] where she  is followed for her cervical cancer stage IV. Antibiotics were to be given through the Port-A-Cath with treatment extended until 7/4. Opiates were continued for her chronic pain syndrome; but these were subsequently discontinued at the SNF because of severe constipation. She was discharged to the SNF for rehab with PT/OT.  Past medical and surgical history: Includes hypothyroidism, CKD, and essential hypertension. In addition to urologic procedures include uterine cervical biopsy and thyroidectomy.  Social history: History of social alcohol use. Former smoker she is a former [employer].  Family history: Limited data reviewed.    Review of systems: She is extremely intelligent and communicative.  She understands her hospital course in detail.  She states that the stents are changed every 12 months and this will be pursued in the near future.  She had constipation related to the opiates but this is improving.  She states that 2 Tylenol can control her pain if taken early enough. She is followed by Dr. Gillian Scarce at [facility] and is on [place] as well as chemotherapy.  Her progress has resulted in a decision to change chemotherapy from every 6 weeks to every 8 weeks as per Dr. Carlis Abbott.  Constitutional: No fever, significant weight change, fatigue  Eyes: No redness, discharge, pain, vision change ENT/mouth: No nasal congestion, purulent discharge, earache, change in hearing, sore throat  Cardiovascular: No chest pain, palpitations, paroxysmal nocturnal dyspnea, claudication, edema  Respiratory: No cough, sputum production, hemoptysis, DOE, significant snoring, apnea Gastrointestinal: No heartburn, dysphagia, abdominal pain, nausea /vomiting, rectal bleeding, melena Genitourinary: No dysuria, hematuria, pyuria, incontinence, nocturia Musculoskeletal: No joint stiffness, joint swelling  Hematologic: No rash, pruritus, change in appearance of skin Neurologic: No dizziness, headache, syncope, seizures,  numbness,  tingling Psychiatric: No significant anxiety, depression, insomnia, anorexia Endocrine: No change in hair/skin/nails, excessive thirst, excessive hunger, excessive urination  Hematologic/lymphatic: No significant bruising, lymphadenopathy, abnormal bleeding Allergy/immunology: No itchy/watery eyes, significant sneezing, urticaria, angioedema  Physical exam:  Pertinent or positive findings: In spite of her history of active cancer; her physical appearance is in direct contrast to the complex history.She appears extremely healthy. She is missing multiple maxillary and mandibular teeth.  There is slight increase in S1 and S2.  2 surgical wounds over the posterior flank are dressed.  She has trace edema at the sock line.  Pedal pulses are decreased.  Strength to opposition is extremely good in all extremities and symmetric.  General appearance: Adequately nourished; no acute distress, increased work of breathing is present.   Lymphatic: No lymphadenopathy about the head, neck, axilla. Eyes: No conjunctival inflammation or lid edema is present. There is no scleral icterus. Ears:  External ear exam shows no significant lesions or deformities.   Nose:  External nasal examination shows no deformity or inflammation. Nasal mucosa are pink and moist without lesions, exudates Oral exam: Lips and gums are healthy appearing.There is no oropharyngeal erythema or exudate. Neck:  No thyromegaly, masses, tenderness noted.    Heart:  Normal rate and regular rhythm without gallop, murmur, click, rub.  Lungs: Chest clear to auscultation without wheezes, rhonchi, rales, rubs. Abdomen: Bowel sounds are normal.  Abdomen is soft and nontender with no organomegaly, hernias, masses. GU: Deferred  Extremities:  No cyanosis, clubbing,. Skin: Warm & dry w/o tenting. No visible significant lesions or rash.  See clinical summary under each active problem in the Problem List with associated updated therapeutic  plan

## 2020-10-12 NOTE — Patient Instructions (Signed)
See assessment and plan under each diagnosis in the problem list and acutely for this visit 

## 2020-10-15 NOTE — Assessment & Plan Note (Signed)
Clinically stable w/o signs/symptoms of uncontrolled infection

## 2020-10-15 NOTE — Assessment & Plan Note (Addendum)
No signs /symptoms of residual sepsis. WBC 3500 w normal dif.

## 2020-10-19 ENCOUNTER — Telehealth: Payer: Self-pay

## 2020-10-19 ENCOUNTER — Other Ambulatory Visit (HOSPITAL_COMMUNITY)
Admission: RE | Admit: 2020-10-19 | Discharge: 2020-10-19 | Disposition: A | Discharge status: Home / Self Care | Payer: 59 | Source: Skilled Nursing Facility | Attending: Internal Medicine | Admitting: Internal Medicine

## 2020-10-19 LAB — BASIC METABOLIC PANEL WITH GFR
Anion gap: 7 (ref 5–15)
BUN: 44 mg/dL — ABNORMAL HIGH (ref 6–20)
CO2: 25 mmol/L (ref 22–32)
Calcium: 9.1 mg/dL (ref 8.9–10.3)
Chloride: 106 mmol/L (ref 98–111)
Creatinine, Ser: 2.13 mg/dL — ABNORMAL HIGH (ref 0.44–1.00)
GFR, Estimated: 27 mL/min — ABNORMAL LOW
Glucose, Bld: 88 mg/dL (ref 70–99)
Potassium: 5.2 mmol/L — ABNORMAL HIGH (ref 3.5–5.1)
Sodium: 138 mmol/L (ref 135–145)

## 2020-10-19 LAB — CBC WITH DIFFERENTIAL/PLATELET
Abs Immature Granulocytes: 0.02 10*3/uL (ref 0.00–0.07)
Basophils Absolute: 0 10*3/uL (ref 0.0–0.1)
Basophils Relative: 1 %
Eosinophils Absolute: 0.2 10*3/uL (ref 0.0–0.5)
Eosinophils Relative: 4 %
HCT: 31.9 % — ABNORMAL LOW (ref 36.0–46.0)
Hemoglobin: 10 g/dL — ABNORMAL LOW (ref 12.0–15.0)
Immature Granulocytes: 0 %
Lymphocytes Relative: 38 %
Lymphs Abs: 2 10*3/uL (ref 0.7–4.0)
MCH: 31.2 pg (ref 26.0–34.0)
MCHC: 31.3 g/dL (ref 30.0–36.0)
MCV: 99.4 fL (ref 80.0–100.0)
Monocytes Absolute: 0.7 10*3/uL (ref 0.1–1.0)
Monocytes Relative: 14 %
Neutro Abs: 2.2 10*3/uL (ref 1.7–7.7)
Neutrophils Relative %: 43 %
Platelets: 211 10*3/uL (ref 150–400)
RBC: 3.21 MIL/uL — ABNORMAL LOW (ref 3.87–5.11)
RDW: 14.6 % (ref 11.5–15.5)
WBC: 5.2 10*3/uL (ref 4.0–10.5)
nRBC: 0 % (ref 0.0–0.2)

## 2020-10-19 LAB — SEDIMENTATION RATE: Sed Rate: 85 mm/hr — ABNORMAL HIGH (ref 0–22)

## 2020-10-19 LAB — C-REACTIVE PROTEIN: CRP: 0.9 mg/dL (ref <1.0)

## 2020-10-19 NOTE — Telephone Encounter (Signed)
Spoke with patient, advised her that we do not remove drains in our office, but that she can ask Dr. Juleen China about it at her appointment tomorrow. Patient verbalized understanding and has no further questions.   Beryle Flock, RN

## 2020-10-19 NOTE — Telephone Encounter (Signed)
Called patient to confirm appointment with Dr. Juleen China for 10/20/20, patient confirmed but had a question for Dr. Juleen China. She wants to know if the Drain bags on her back will be removed before she leaves home next week, patient expressed she does not want to go home with them. I let patient know I would let the provider know and she could get a call back today or they would let her know tomorrow at her visit . Best contact number [phone]

## 2020-10-20 ENCOUNTER — Other Ambulatory Visit: Payer: Self-pay

## 2020-10-20 ENCOUNTER — Ambulatory Visit (INDEPENDENT_AMBULATORY_CARE_PROVIDER_SITE_OTHER): Payer: 59 | Admitting: Internal Medicine

## 2020-10-20 ENCOUNTER — Encounter: Payer: Self-pay | Admitting: Internal Medicine

## 2020-10-20 VITALS — BP 149/90 | HR 78 | Temp 98.0°F | Wt 186.6 lb

## 2020-10-20 DIAGNOSIS — N1330 Unspecified hydronephrosis: Secondary | ICD-10-CM | POA: Diagnosis not present

## 2020-10-20 DIAGNOSIS — C539 Malignant neoplasm of cervix uteri, unspecified: Secondary | ICD-10-CM | POA: Diagnosis not present

## 2020-10-20 DIAGNOSIS — N3000 Acute cystitis without hematuria: Secondary | ICD-10-CM

## 2020-10-20 DIAGNOSIS — N179 Acute kidney failure, unspecified: Secondary | ICD-10-CM

## 2020-10-20 DIAGNOSIS — N1832 Chronic kidney disease, stage 3b: Secondary | ICD-10-CM

## 2020-10-20 DIAGNOSIS — R7881 Bacteremia: Secondary | ICD-10-CM | POA: Diagnosis not present

## 2020-10-20 NOTE — Progress Notes (Signed)
[place] for Infectious Disease  Reason for Consult: MSSA bacteremia  Referring Provider: hospital follow up from [facility]   HPI:    Tanya Wright is a 55 y.o. female with PMHx as below who presents to the clinic for follow up of MSSA bacteremia.   Patient was recently hospitalized at [facility] from 09/23/2020 to 10/04/2020 with sepsis felt to be secondary to urinary source with AKI and bilateral hydronephrosis found to have MSSA bacteremia.  She has a history of cervical cancer status post radiation therapy and bilateral distal ureteral obstruction that had been managed by bilateral stents.  She is currently on immunotherapy with [facility] as an outpatient.  She underwent transesophageal echocardiogram during her admission that was negative.  Her blood cultures were positive on 6/2 with subsequent blood cultures 6/5 being no growth.  Her acute kidney injury did not improve and her hydronephrosis also did not improve so she underwent percutaneous nephrostomy tube placement on 09/28/2020 with subsequent improvement in her creatinine.  I had asked for her Port-A-Cath to be removed as well in the setting of MSSA, but this was not done as her sepsis physiology had resolved and the source of infection was felt to be her urine.  She was discharged on cefazolin for 4 weeks with an end date of 10/25/20 and has been receiving her antibiotics through a Port-A-Cath.  She was discharged to a skilled nursing facility and was evaluated by Dr. Linna Darner on 6/21 who noted no signs or symptoms of residual sepsis.  It appears that she has follow-up with Gyn Onc on 10/29/20.  She presents today and reports doing well without fevers or chills.  She is getting better and has good urine output and continues to empty her nephrostomy bags regularly.  She has some discomfort at her right tube site.  She is hopeful to have the bags removed soon but I told her this would be up to her urology and Gyn/Onc providers.    Patient's Medications  New Prescriptions   No medications on file  Previous Medications   ACETAMINOPHEN (TYLENOL) 325 MG TABLET    Take 2 tablets (650 mg total) by mouth every 6 (six) hours as needed for mild pain (or Fever >/= 101).   CALCIUM ACETATE (PHOSLO) 667 MG CAPSULE    Take 1,334 mg by mouth 3 (three) times daily with meals.   CEFAZOLIN (ANCEF) IVPB    Inject 2 g into the vein every 12 (twelve) hours for 21 days. Indication:  MSSA bacteremia First Dose: yes Last Day of Therapy:  10/25/2020 Labs - Once weekly:  CBC/D and BMP, Labs - Every other week:  ESR and CRP Method of administration: IV Push Method of administration may be changed at the discretion of home infusion pharmacist based upon assessment of the patient and/or caregiver's ability to self-administer the medication ordered.   FLUCONAZOLE (DIFLUCAN) 150 MG TABLET    Take 150 mg by mouth daily.   FLUOXETINE (PROZAC) 10 MG CAPSULE    Take 10 mg by mouth daily.   LEVOTHYROXINE (SYNTHROID) 175 MCG TABLET    Take 175 mcg by mouth daily.   OXYCODONE (OXY IR/ROXICODONE) 5 MG IMMEDIATE RELEASE TABLET    Take 1 tablet (5 mg total) by mouth every 6 (six) hours as needed for severe pain.   POLYETHYLENE GLYCOL (MIRALAX / GLYCOLAX) PACKET    Take 17 g by mouth daily.   SENNA (SENOKOT) 8.6 MG TABLET    Take  1 tablet by mouth at bedtime.  Modified Medications   No medications on file  Discontinued Medications   No medications on file      Past Medical History:  Diagnosis Date   Cancer ([place])    Cervical cancer ([place])    Hypertension    Renal disorder    Thyroid disease     Social History   Tobacco Use   Smoking status: Former    Pack years: 0.00    Types: Cigarettes   Smokeless tobacco: Never  Substance Use Topics   Alcohol use: Yes   Drug use: Yes    Types: Marijuana    No family history on file.  Allergies  Allergen Reactions   Oxycodone Itching    Review of Systems  Constitutional:  Negative for chills  and fever.  Respiratory: Negative.    Cardiovascular:  Negative for leg swelling.  Gastrointestinal: Negative.   Genitourinary:  Positive for flank pain. Negative for dysuria, hematuria and urgency.  Skin: Negative.      OBJECTIVE:    Vitals:   10/20/20 1010  BP: (!) 149/90  Pulse: 78  Temp: 98 F (36.7 C)  TempSrc: Oral  Weight: 186 lb 9.6 oz (84.6 kg)     Body mass index is 26.03 kg/m.  Physical Exam Constitutional:      General: She is not in acute distress.    Appearance: Normal appearance.  HENT:     Head: Normocephalic and atraumatic.  Cardiovascular:     Comments: Right sided chest port not tender, warm, or erythematous. Pulmonary:     Effort: Pulmonary effort is normal. No respiratory distress.  Genitourinary:    Comments: Bilateral percutaneous nephrostomy tubes in place.  Neurological:     General: No focal deficit present.     Mental Status: She is alert and oriented to person, place, and time.  Psychiatric:        Mood and Affect: Mood normal.        Behavior: Behavior normal.     Labs and Microbiology:  CBC Latest Ref Rng & Units 10/19/2020 10/12/2020 10/05/2020  WBC 4.0 - 10.5 K/uL 5.2 3.5(L) 4.8  Hemoglobin 12.0 - 15.0 g/dL 10.0(L) 10.4(L) 11.0(L)  Hematocrit 36.0 - 46.0 % 31.9(L) 32.4(L) 34.0(L)  Platelets 150 - 400 K/uL 211 324 492(H)   CMP Latest Ref Rng & Units 10/19/2020 10/12/2020 10/05/2020  Glucose 70 - 99 mg/dL 88 93 92  BUN 6 - 20 mg/dL 44(H) 45(H) 36(H)  Creatinine 0.44 - 1.00 mg/dL 2.13(H) 2.16(H) 2.70(H)  Sodium 135 - 145 mmol/L 138 137 134(L)  Potassium 3.5 - 5.1 mmol/L 5.2(H) 5.0 5.6(H)  Chloride 98 - 111 mmol/L 106 106 99  CO2 22 - 32 mmol/L 25 24 26   Calcium 8.9 - 10.3 mg/dL 9.1 8.7(L) 9.8  Total Protein 6.5 - 8.1 g/dL - - -  Total Bilirubin 0.3 - 1.2 mg/dL - - -  Alkaline Phos 38 - 126 U/L - - -  AST 15 - 41 U/L - - -  ALT 0 - 44 U/L - - -      Imaging: IMPRESSION: 1. Bilateral double-J ureteral stents are positioned  with formed pigtails in the renal pelves and urinary bladder. There is moderate bilateral hydronephrosis, not significantly changed compared to prior CT. 2. There are numerous, enlarged, calcified retroperitoneal, bilateral iliac, and bilateral pelvic sidewall lymph nodes, consistent with treated nodal metastatic disease. 3. Fluid-filled endometrial cavity, likely related to endocervical obstruction in the setting of  known malignancy.   ASSESSMENT & PLAN:     MSSA bacteremia: felt to be related to urinary source with bilateral hydronephrosis and AKI s/p percutaneous nephrostomy tube placement.  TEE negative and blood cultures rapidly cleared.  No signs/symptoms of recurrent sepsis at this time.  Will continue cefazolin as planned through 10/25/20 and then can stop antibiotics.  Her port was not removed during her hospitalization despite my asking to be done as her infection was felt to be secondary to her urine.  If she has recurrent sepsis after stopping antibiotics then would recommend this be removed.    AKI on CKD3: baseline creatinine 1.9.  Admitted with creatinine 3.1 now s/p PCN tube placement and recent creatinine 2.1 yesterday.  Bilateral hydronephrosis: despite presence of double-J ureteral stents.  S/p percutaneous nephrostomy tube placement 09/28/20 with IR and improvement in creatinine.  Will follow up with Gyn Onc next week and will need urology follow up as well.   Cervical cancer: followed at [facility] and will follow up next week as an outpatient.  I see no reason why she should not proceed with treatment.  Follow up PRN  Raynelle Highland for Infectious Disease [facility] 10/20/2020, 10:49 AM

## 2020-10-20 NOTE — Patient Instructions (Signed)
Thank you for coming to see me today. It was a pleasure seeing you.  To Do: Continue cefazolin antibiotic through 10/25/20. Follow up with your [facility] doctors at Gyn/Onc and Urology regarding the nephrostomy tube management.  If you have any questions or concerns, please do not hesitate to call the office at [phone].  Take Care,   Jule Ser

## 2020-10-21 ENCOUNTER — Encounter: Payer: Self-pay | Admitting: Adult Health

## 2020-10-21 ENCOUNTER — Non-Acute Institutional Stay (SKILLED_NURSING_FACILITY): Payer: 59 | Admitting: Adult Health

## 2020-10-21 DIAGNOSIS — I700 Atherosclerosis of aorta: Secondary | ICD-10-CM

## 2020-10-21 DIAGNOSIS — F339 Major depressive disorder, recurrent, unspecified: Secondary | ICD-10-CM

## 2020-10-21 DIAGNOSIS — C539 Malignant neoplasm of cervix uteri, unspecified: Secondary | ICD-10-CM

## 2020-10-21 DIAGNOSIS — A4101 Sepsis due to Methicillin susceptible Staphylococcus aureus: Secondary | ICD-10-CM | POA: Diagnosis not present

## 2020-10-21 DIAGNOSIS — R6520 Severe sepsis without septic shock: Secondary | ICD-10-CM

## 2020-10-21 NOTE — Progress Notes (Signed)
Location:  [place] Room Number: 143-P Place of Service:  SNF (31)   CODE STATUS: UNKNOWN  Allergies  Allergen Reactions  . Oxycodone Itching    Chief Complaint  Patient presents with  . Acute Visit    Care plan meeting    HPI:  We have come together for her care plan meeting. BIMS 15/15 mood 0/30. She is independent with her adls; and does feed herself. She is continent of bladder and bowel. Is up and about the facility she is on IV abt through 10-25-20; she will then return back home. She is not being seen by therapy. Her weight is stable her appetite is good. There are no reports of uncontrolled pain. She continues to be followed for her chronic illnesses including: Aortic atherosclerosis Malignant neoplasm of uterus unspecified site Major depression recurrent chronic  Sepsis due to methicillin susceptible staphylococcus aureus (MSSA) with acute organ dysfunction without septic shock unspecified type  Past Medical History:  Diagnosis Date  . Cancer ([place])   . Cervical cancer ([place])   . Hypertension   . Renal disorder   . Thyroid disease     Past Surgical History:  Procedure Laterality Date  . IR NEPHROSTOMY PLACEMENT LEFT  09/28/2020  . IR NEPHROSTOMY PLACEMENT RIGHT  09/28/2020  . STENT PLACEMENT RT URETER ([place] HX)     sent in both kidneys for stones  . TEE WITHOUT CARDIOVERSION N/A 09/29/2020   Procedure: TRANSESOPHAGEAL ECHOCARDIOGRAM (TEE) WITH PROPOFOL;  Surgeon: Fay Records, MD;  Location: [facility];  Service: Cardiovascular;  Laterality: N/A;  . TUBAL LIGATION      Social History   Socioeconomic History  . Marital status: Divorced    Spouse name: Not on file  . Number of children: Not on file  . Years of education: Not on file  . Highest education level: Not on file  Occupational History  . Not on file  Tobacco Use  . Smoking status: Former    Pack years: 0.00    Types: Cigarettes  . Smokeless tobacco: Never  Substance and Sexual  Activity  . Alcohol use: Yes  . Drug use: Yes    Types: Marijuana  . Sexual activity: Yes  Other Topics Concern  . Not on file  Social History Narrative  . Not on file   Social Determinants of Health   Financial Resource Strain: Not on file  Food Insecurity: Not on file  Transportation Needs: Not on file  Physical Activity: Not on file  Stress: Not on file  Social Connections: Not on file  Intimate Partner Violence: Not on file   History reviewed. No pertinent family history.    VITAL SIGNS BP 105/70   Pulse 79   Temp (!) 96.5 F (35.8 C)   Ht 5' 11"  (1.803 m)   LMP 02/28/2017   SpO2 100%   BMI 26.03 kg/m   Outpatient Encounter Medications as of 10/21/2020  Medication Sig  . acetaminophen (TYLENOL) 325 MG tablet Take 2 tablets (650 mg total) by mouth every 6 (six) hours as needed for mild pain (or Fever >/= 101).  . calcium acetate (PHOSLO) 667 MG capsule Take 1,334 mg by mouth 3 (three) times daily with meals.  Marland Kitchen ceFAZolin (ANCEF) IVPB Inject 2 g into the vein every 12 (twelve) hours for 21 days. Indication:  MSSA bacteremia First Dose: yes Last Day of Therapy:  10/25/2020 Labs - Once weekly:  CBC/D and BMP, Labs - Every other week:  ESR and CRP Method of administration: IV Push Method of administration may be changed at the discretion of home infusion pharmacist based upon assessment of the patient and/or caregiver's ability to self-administer the medication ordered.  Marland Kitchen levothyroxine (SYNTHROID) 175 MCG tablet Take 175 mcg by mouth daily.  . polyethylene glycol (MIRALAX / GLYCOLAX) packet Take 17 g by mouth daily.  Marland Kitchen senna (SENOKOT) 8.6 MG tablet Take 1 tablet by mouth at bedtime.  Marland Kitchen FLUoxetine (PROZAC) 10 MG capsule Take 10 mg by mouth daily.  . [DISCONTINUED] fluconazole (DIFLUCAN) 150 MG tablet Take 150 mg by mouth daily.  . [DISCONTINUED] oxyCODONE (OXY IR/ROXICODONE) 5 MG immediate release tablet Take 1 tablet (5 mg total) by mouth every 6 (six) hours as  needed for severe pain. (Patient taking differently: Take 5 mg by mouth every 4 (four) hours as needed for severe pain.)   No facility-administered encounter medications on file as of 10/21/2020.     SIGNIFICANT DIAGNOSTIC EXAMS   PREVIOUS   09-23-20: chest x-ray: No acute cardiopulmonary abnormality   09-23-20: renal stone study:  1. No acute intra-abdominal process. 2. Bilateral double-J ureteral stents with chronic bilateral hydronephrosis, mildly improved compared to prior study. 3. Improved but continued distension of the endometrial canal, which measures up to 1.9 cm, previously 3.6 cm. This may reflect obstruction related to cervical cancer/post treatment changes. Consider pelvic ultrasound for further evaluation. 4. Chronic densely calcified retroperitoneal and pelvic lymphadenopathy, similar to prior study, consistent with treated metastatic disease. 5. Aortic Atherosclerosis  NO NEW EXAMS.   LABS REVIEWED; PREVIOUS   09-23-20: wbc 11.3; hgb 12.4; hct 37.6; mcv 97.4 plt 171; glucose 142; bun 38; creat 3.16; k+ 3.7; na++ 132; ca 8.2 GFR 17 10-05-20: wbc 4.8; hgb 11.0; hct 34.0; mcv 97.7 plt 492; glucose 92; bun 36; creat 2.70; k+ 5.6; na++ 134; ca 9.8 GFR 20   NO NEW LABS.   Review of Systems  Constitutional:  Negative for malaise/fatigue.  Respiratory:  Negative for cough and shortness of breath.   Cardiovascular:  Negative for chest pain, palpitations and leg swelling.  Gastrointestinal:  Negative for abdominal pain, constipation and heartburn.  Musculoskeletal:  Negative for back pain, joint pain and myalgias.  Skin: Negative.   Neurological:  Negative for dizziness.  Psychiatric/Behavioral:  The patient is not nervous/anxious.    Physical Exam Constitutional:      General: She is not in acute distress.    Appearance: She is well-developed. She is not diaphoretic.  Neck:     Thyroid: No thyromegaly.  Cardiovascular:     Rate and Rhythm: Normal rate and regular  rhythm.     Pulses: Normal pulses.     Heart sounds: Normal heart sounds.  Pulmonary:     Effort: Pulmonary effort is normal. No respiratory distress.     Breath sounds: Normal breath sounds.  Abdominal:     General: Bowel sounds are normal. There is no distension.     Palpations: Abdomen is soft.     Tenderness: There is no abdominal tenderness.  Musculoskeletal:        General: Normal range of motion.     Cervical back: Neck supple.     Right lower leg: No edema.     Left lower leg: No edema.  Lymphadenopathy:     Cervical: No cervical adenopathy.  Skin:    General: Skin is warm and dry.     Comments: Port a cath   Neurological:     Mental  Status: She is alert and oriented to person, place, and time.  Psychiatric:        Mood and Affect: Mood normal.     ASSESSMENT/ PLAN:  TODAY  Aortic atherosclerosis Malignant neoplasm of uterus unspecified site Major depression recurrent chronic Sepsis due to methicillin susceptible staphylococcus aureus (MSSA) with acute organ dysfunction without septic shock unspecified type  Will continue current medications Will continue current plan of care Will continue to monitor her status. Her goal is to return back home.    Time spent with patient: 40 minutes: plan of care goals of care; medications.       Ok Edwards NP [facility]  Contact [phone] Monday through Friday 8am- 5pm  After hours call [phone]

## 2020-10-22 ENCOUNTER — Encounter: Payer: Self-pay | Admitting: Adult Health

## 2020-10-22 ENCOUNTER — Non-Acute Institutional Stay (SKILLED_NURSING_FACILITY): Payer: 59 | Admitting: Adult Health

## 2020-10-22 ENCOUNTER — Other Ambulatory Visit: Payer: Self-pay | Admitting: Adult Health

## 2020-10-22 DIAGNOSIS — F339 Major depressive disorder, recurrent, unspecified: Secondary | ICD-10-CM | POA: Insufficient documentation

## 2020-10-22 DIAGNOSIS — C539 Malignant neoplasm of cervix uteri, unspecified: Secondary | ICD-10-CM | POA: Diagnosis not present

## 2020-10-22 DIAGNOSIS — K5909 Other constipation: Secondary | ICD-10-CM | POA: Insufficient documentation

## 2020-10-22 DIAGNOSIS — I700 Atherosclerosis of aorta: Secondary | ICD-10-CM

## 2020-10-22 DIAGNOSIS — N1832 Chronic kidney disease, stage 3b: Secondary | ICD-10-CM | POA: Diagnosis not present

## 2020-10-22 DIAGNOSIS — R6520 Severe sepsis without septic shock: Secondary | ICD-10-CM

## 2020-10-22 DIAGNOSIS — A4101 Sepsis due to Methicillin susceptible Staphylococcus aureus: Secondary | ICD-10-CM

## 2020-10-22 DIAGNOSIS — E039 Hypothyroidism, unspecified: Secondary | ICD-10-CM | POA: Insufficient documentation

## 2020-10-22 MED ORDER — LEVOTHYROXINE SODIUM 175 MCG PO TABS: 175.0000 ug | ORAL_TABLET | Freq: Every day | ORAL | 0 refills | Status: DC

## 2020-10-22 MED ORDER — CALCIUM ACETATE (PHOS BINDER) 667 MG PO CAPS: 1334.0000 mg | ORAL_CAPSULE | Freq: Three times a day (TID) | ORAL | 0 refills | Status: AC

## 2020-10-22 MED ORDER — FLUOXETINE HCL 10 MG PO CAPS: 10.0000 mg | ORAL_CAPSULE | Freq: Every day | ORAL | 0 refills | Status: DC

## 2020-10-22 NOTE — Progress Notes (Signed)
Location:   [facility] Room Number: 143-P Place of Service:  SNF (31)    CODE STATUS: UNKNOWN  Allergies  Allergen Reactions  . Oxycodone Itching    Chief Complaint  Patient presents with  . Discharge Note    Discharge to home.    HPI:  She is being discharged to home. She will not need home health as she is independent with her adls. She will not need any dme. She will need her prescriptions written and will need to follow up with her medical provider. She had been hospitalized with MSSA sepsis. She was admitted to this facility for her IV ABT therapy. She is has completed her abt and is ready to return home.   Past Medical History:  Diagnosis Date  . Cancer ([place])   . Cervical cancer ([place])   . Hypertension   . Renal disorder   . Thyroid disease     Past Surgical History:  Procedure Laterality Date  . IR NEPHROSTOMY PLACEMENT LEFT  09/28/2020  . IR NEPHROSTOMY PLACEMENT RIGHT  09/28/2020  . STENT PLACEMENT RT URETER ([place] HX)     sent in both kidneys for stones  . TEE WITHOUT CARDIOVERSION N/A 09/29/2020   Procedure: TRANSESOPHAGEAL ECHOCARDIOGRAM (TEE) WITH PROPOFOL;  Surgeon: Fay Records, MD;  Location: [facility];  Service: Cardiovascular;  Laterality: N/A;  . TUBAL LIGATION      Social History   Socioeconomic History  . Marital status: Divorced    Spouse name: Not on file  . Number of children: Not on file  . Years of education: Not on file  . Highest education level: Not on file  Occupational History  . Not on file  Tobacco Use  . Smoking status: Former    Pack years: 0.00    Types: Cigarettes  . Smokeless tobacco: Never  Substance and Sexual Activity  . Alcohol use: Yes  . Drug use: Yes    Types: Marijuana  . Sexual activity: Yes  Other Topics Concern  . Not on file  Social History Narrative  . Not on file   Social Determinants of Health   Financial Resource Strain: Not on file  Food Insecurity: Not on file  Transportation  Needs: Not on file  Physical Activity: Not on file  Stress: Not on file  Social Connections: Not on file  Intimate Partner Violence: Not on file   History reviewed. No pertinent family history.  VITAL SIGNS BP 105/70   Pulse 79   Temp (!) 96.5 F (35.8 C)   Resp 20   LMP 02/28/2017   SpO2 100%   Patient's Medications  New Prescriptions   No medications on file  Previous Medications   ACETAMINOPHEN (TYLENOL) 325 MG TABLET    Take 2 tablets (650 mg total) by mouth every 6 (six) hours as needed for mild pain (or Fever >/= 101).   CALCIUM ACETATE (PHOSLO) 667 MG CAPSULE    Take 1,334 mg by mouth 3 (three) times daily with meals.   CEFAZOLIN (ANCEF) IVPB    Inject 2 g into the vein every 12 (twelve) hours for 21 days. Indication:  MSSA bacteremia First Dose: yes Last Day of Therapy:  10/25/2020 Labs - Once weekly:  CBC/D and BMP, Labs - Every other week:  ESR and CRP Method of administration: IV Push Method of administration may be changed at the discretion of home infusion pharmacist based upon assessment of the patient and/or caregiver's ability to self-administer the  medication ordered.   FLUOXETINE (PROZAC) 10 MG CAPSULE    Take 10 mg by mouth daily.   LEVOTHYROXINE (SYNTHROID) 175 MCG TABLET    Take 175 mcg by mouth daily.   POLYETHYLENE GLYCOL (MIRALAX / GLYCOLAX) PACKET    Take 17 g by mouth daily.   SENNA (SENOKOT) 8.6 MG TABLET    Take 1 tablet by mouth at bedtime.  Modified Medications   No medications on file  Discontinued Medications   No medications on file     SIGNIFICANT DIAGNOSTIC EXAMS  PREVIOUS   09-23-20: chest x-ray: No acute cardiopulmonary abnormality   09-23-20: renal stone study:  1. No acute intra-abdominal process. 2. Bilateral double-J ureteral stents with chronic bilateral hydronephrosis, mildly improved compared to prior study. 3. Improved but continued distension of the endometrial canal, which measures up to 1.9 cm, previously 3.6 cm. This may  reflect obstruction related to cervical cancer/post treatment changes. Consider pelvic ultrasound for further evaluation. 4. Chronic densely calcified retroperitoneal and pelvic lymphadenopathy, similar to prior study, consistent with treated metastatic disease. 5. Aortic Atherosclerosis  NO NEW EXAMS.   LABS REVIEWED; PREVIOUS   09-23-20: wbc 11.3; hgb 12.4; hct 37.6; mcv 97.4 plt 171; glucose 142; bun 38; creat 3.16; k+ 3.7; na++ 132; ca 8.2 GFR 17 10-05-20: wbc 4.8; hgb 11.0; hct 34.0; mcv 97.7 plt 492; glucose 92; bun 36; creat 2.70; k+ 5.6; na++ 134; ca 9.8 GFR 20   NO NEW LABS.  Review of Systems  Constitutional:  Negative for malaise/fatigue.  Respiratory:  Negative for cough and shortness of breath.   Cardiovascular:  Negative for chest pain, palpitations and leg swelling.  Gastrointestinal:  Negative for abdominal pain, constipation and heartburn.  Musculoskeletal:  Negative for back pain, joint pain and myalgias.  Skin: Negative.   Neurological:  Negative for dizziness.  Psychiatric/Behavioral:  The patient is not nervous/anxious.     Physical Exam Constitutional:      General: She is not in acute distress.    Appearance: She is well-developed. She is not diaphoretic.  Neck:     Thyroid: No thyromegaly.  Cardiovascular:     Rate and Rhythm: Normal rate and regular rhythm.     Heart sounds: Normal heart sounds.  Pulmonary:     Effort: Pulmonary effort is normal. No respiratory distress.     Breath sounds: Normal breath sounds.  Abdominal:     General: Bowel sounds are normal. There is no distension.     Palpations: Abdomen is soft.     Tenderness: There is no abdominal tenderness.  Musculoskeletal:        General: Normal range of motion.     Cervical back: Neck supple.     Right lower leg: No edema.     Left lower leg: No edema.  Lymphadenopathy:     Cervical: No cervical adenopathy.  Skin:    General: Skin is warm and dry.  Neurological:     Mental Status:  She is alert and oriented to person, place, and time.  Psychiatric:        Mood and Affect: Mood normal.     ASSESSMENT/ PLAN:  Patient is being discharged with the following home health services:  none needed   Patient is being discharged with the following durable medical equipment:  none needed  Patient has been advised to f/u with their PCP in 1-2 weeks to bring them up to date on their rehab stay.  Social services at facility was responsible  for arranging this appointment.  Pt was provided with a 30 day supply of prescriptions for medications and refills must be obtained from their PCP.  For controlled substances, a more limited supply may be provided adequate until PCP appointment only.  A 30 day supply of her prescription medications have been sent to: [facility] in [place].      Ok Edwards NP [facility]  Contact [phone] Monday through Friday 8am- 5pm  After hours call [phone]

## 2021-07-04 ENCOUNTER — Other Ambulatory Visit: Payer: Self-pay | Admitting: Adult Health

## 2021-07-14 ENCOUNTER — Other Ambulatory Visit: Payer: Self-pay | Admitting: Adult Health

## 2021-07-26 ENCOUNTER — Other Ambulatory Visit: Payer: Self-pay | Admitting: Adult Health

## 2022-06-04 ENCOUNTER — Emergency Department (HOSPITAL_COMMUNITY): Payer: 59

## 2022-06-04 ENCOUNTER — Other Ambulatory Visit: Payer: Self-pay

## 2022-06-04 ENCOUNTER — Inpatient Hospital Stay (HOSPITAL_COMMUNITY)
Admission: EM | Admit: 2022-06-04 | Discharge: 2022-06-07 | DRG: 698 | Disposition: A | Discharge status: Home / Self Care | Payer: 59 | Attending: Family Medicine | Admitting: Family Medicine

## 2022-06-04 DIAGNOSIS — E039 Hypothyroidism, unspecified: Secondary | ICD-10-CM | POA: Diagnosis present

## 2022-06-04 DIAGNOSIS — T83593A Infection and inflammatory reaction due to other urinary stents, initial encounter: Secondary | ICD-10-CM | POA: Diagnosis not present

## 2022-06-04 DIAGNOSIS — Z87891 Personal history of nicotine dependence: Secondary | ICD-10-CM

## 2022-06-04 DIAGNOSIS — R0789 Other chest pain: Secondary | ICD-10-CM | POA: Diagnosis present

## 2022-06-04 DIAGNOSIS — Z1152 Encounter for screening for COVID-19: Secondary | ICD-10-CM

## 2022-06-04 DIAGNOSIS — F339 Major depressive disorder, recurrent, unspecified: Secondary | ICD-10-CM | POA: Diagnosis present

## 2022-06-04 DIAGNOSIS — I129 Hypertensive chronic kidney disease with stage 1 through stage 4 chronic kidney disease, or unspecified chronic kidney disease: Secondary | ICD-10-CM | POA: Diagnosis present

## 2022-06-04 DIAGNOSIS — N12 Tubulo-interstitial nephritis, not specified as acute or chronic: Secondary | ICD-10-CM | POA: Diagnosis not present

## 2022-06-04 DIAGNOSIS — F17200 Nicotine dependence, unspecified, uncomplicated: Secondary | ICD-10-CM | POA: Insufficient documentation

## 2022-06-04 DIAGNOSIS — R6520 Severe sepsis without septic shock: Secondary | ICD-10-CM | POA: Diagnosis not present

## 2022-06-04 DIAGNOSIS — N184 Chronic kidney disease, stage 4 (severe): Secondary | ICD-10-CM | POA: Diagnosis present

## 2022-06-04 DIAGNOSIS — N390 Urinary tract infection, site not specified: Secondary | ICD-10-CM | POA: Diagnosis present

## 2022-06-04 DIAGNOSIS — N136 Pyonephrosis: Secondary | ICD-10-CM | POA: Diagnosis present

## 2022-06-04 DIAGNOSIS — N183 Chronic kidney disease, stage 3 unspecified: Secondary | ICD-10-CM | POA: Diagnosis present

## 2022-06-04 DIAGNOSIS — Z79890 Hormone replacement therapy (postmenopausal): Secondary | ICD-10-CM

## 2022-06-04 DIAGNOSIS — N179 Acute kidney failure, unspecified: Secondary | ICD-10-CM | POA: Diagnosis present

## 2022-06-04 DIAGNOSIS — A4101 Sepsis due to Methicillin susceptible Staphylococcus aureus: Secondary | ICD-10-CM | POA: Diagnosis not present

## 2022-06-04 DIAGNOSIS — A419 Sepsis, unspecified organism: Secondary | ICD-10-CM | POA: Diagnosis present

## 2022-06-04 DIAGNOSIS — N3000 Acute cystitis without hematuria: Secondary | ICD-10-CM

## 2022-06-04 DIAGNOSIS — N170 Acute kidney failure with tubular necrosis: Secondary | ICD-10-CM

## 2022-06-04 DIAGNOSIS — C539 Malignant neoplasm of cervix uteri, unspecified: Secondary | ICD-10-CM | POA: Diagnosis present

## 2022-06-04 DIAGNOSIS — Z885 Allergy status to narcotic agent status: Secondary | ICD-10-CM | POA: Diagnosis not present

## 2022-06-04 DIAGNOSIS — Y732 Prosthetic and other implants, materials and accessory gastroenterology and urology devices associated with adverse incidents: Secondary | ICD-10-CM | POA: Diagnosis present

## 2022-06-04 DIAGNOSIS — N138 Other obstructive and reflux uropathy: Secondary | ICD-10-CM | POA: Diagnosis not present

## 2022-06-04 DIAGNOSIS — Z79899 Other long term (current) drug therapy: Secondary | ICD-10-CM

## 2022-06-04 DIAGNOSIS — N1339 Other hydronephrosis: Secondary | ICD-10-CM

## 2022-06-04 DIAGNOSIS — R197 Diarrhea, unspecified: Secondary | ICD-10-CM | POA: Diagnosis present

## 2022-06-04 DIAGNOSIS — A4151 Sepsis due to Escherichia coli [E. coli]: Secondary | ICD-10-CM | POA: Diagnosis present

## 2022-06-04 DIAGNOSIS — N1330 Unspecified hydronephrosis: Secondary | ICD-10-CM | POA: Diagnosis not present

## 2022-06-04 LAB — CBC WITH DIFFERENTIAL/PLATELET
Abs Immature Granulocytes: 0.05 10*3/uL (ref 0.00–0.07)
Basophils Absolute: 0 10*3/uL (ref 0.0–0.1)
Basophils Relative: 0 %
Eosinophils Absolute: 0.1 10*3/uL (ref 0.0–0.5)
Eosinophils Relative: 1 %
HCT: 37.2 % (ref 36.0–46.0)
Hemoglobin: 12.2 g/dL (ref 12.0–15.0)
Immature Granulocytes: 1 %
Lymphocytes Relative: 13 %
Lymphs Abs: 0.7 10*3/uL (ref 0.7–4.0)
MCH: 29.8 pg (ref 26.0–34.0)
MCHC: 32.8 g/dL (ref 30.0–36.0)
MCV: 91 fL (ref 80.0–100.0)
Monocytes Absolute: 0.8 10*3/uL (ref 0.1–1.0)
Monocytes Relative: 15 %
Neutro Abs: 3.8 10*3/uL (ref 1.7–7.7)
Neutrophils Relative %: 70 %
Platelets: 195 10*3/uL (ref 150–400)
RBC: 4.09 MIL/uL (ref 3.87–5.11)
RDW: 14.9 % (ref 11.5–15.5)
WBC: 5.5 10*3/uL (ref 4.0–10.5)
nRBC: 0 % (ref 0.0–0.2)

## 2022-06-04 LAB — COMPREHENSIVE METABOLIC PANEL
ALT: 61 U/L — ABNORMAL HIGH (ref 0–44)
AST: 56 U/L — ABNORMAL HIGH (ref 15–41)
Albumin: 2.9 g/dL — ABNORMAL LOW (ref 3.5–5.0)
Alkaline Phosphatase: 117 U/L (ref 38–126)
Anion gap: 12 (ref 5–15)
BUN: 63 mg/dL — ABNORMAL HIGH (ref 6–20)
CO2: 20 mmol/L — ABNORMAL LOW (ref 22–32)
Calcium: 8.4 mg/dL — ABNORMAL LOW (ref 8.9–10.3)
Chloride: 100 mmol/L (ref 98–111)
Creatinine, Ser: 4.51 mg/dL — ABNORMAL HIGH (ref 0.44–1.00)
GFR, Estimated: 11 mL/min — ABNORMAL LOW (ref >60)
Glucose, Bld: 108 mg/dL — ABNORMAL HIGH (ref 70–99)
Potassium: 4.5 mmol/L (ref 3.5–5.1)
Sodium: 132 mmol/L — ABNORMAL LOW (ref 135–145)
Total Bilirubin: 0.8 mg/dL (ref 0.3–1.2)
Total Protein: 7.2 g/dL (ref 6.5–8.1)

## 2022-06-04 LAB — URINALYSIS, W/ REFLEX TO CULTURE (INFECTION SUSPECTED)
Bilirubin Urine: NEGATIVE
Glucose, UA: NEGATIVE mg/dL
Ketones, ur: NEGATIVE mg/dL
Nitrite: POSITIVE — AB
Protein, ur: 100 mg/dL — AB
RBC / HPF: >50 RBC/hpf (ref 0–5)
Specific Gravity, Urine: 1.009 (ref 1.005–1.030)
WBC, UA: >50 WBC/hpf (ref 0–5)
pH: 6 (ref 5.0–8.0)

## 2022-06-04 LAB — LACTIC ACID, PLASMA
Lactic Acid, Venous: 0.7 mmol/L (ref 0.5–1.9)
Lactic Acid, Venous: 0.6 mmol/L (ref 0.5–1.9)

## 2022-06-04 LAB — RESP PANEL BY RT-PCR (RSV, FLU A&B, COVID)  RVPGX2
Influenza A by PCR: NEGATIVE
Influenza B by PCR: NEGATIVE
Resp Syncytial Virus by PCR: NEGATIVE
SARS Coronavirus 2 by RT PCR: NEGATIVE

## 2022-06-04 LAB — CK: Total CK: 91 U/L (ref 38–234)

## 2022-06-04 LAB — HIV ANTIBODY (ROUTINE TESTING W REFLEX): HIV Screen 4th Generation wRfx: NONREACTIVE

## 2022-06-04 MED ORDER — HYDROXYZINE HCL 25 MG PO TABS: 25.0000 mg | ORAL_TABLET | Freq: Three times a day (TID) | ORAL | Status: DC | PRN

## 2022-06-04 MED ORDER — SODIUM CHLORIDE 0.9 % IV SOLN
1.0000 g | INTRAVENOUS | Status: DC
  Administered 2022-06-05 – 2022-06-07 (×3): 1 g via INTRAVENOUS
  Filled 2022-06-04 (×3): qty 10

## 2022-06-04 MED ORDER — HEPARIN SODIUM (PORCINE) 5000 UNIT/ML IJ SOLN
5000.0000 [IU] | Freq: Three times a day (TID) | INTRAMUSCULAR | Status: DC
  Filled 2022-06-04 (×2): qty 1

## 2022-06-04 MED ORDER — SENNA 8.6 MG PO TABS
1.0000 | ORAL_TABLET | Freq: Every day | ORAL | Status: DC
  Administered 2022-06-05 – 2022-06-06 (×2): 8.6 mg via ORAL
  Filled 2022-06-04 (×2): qty 1

## 2022-06-04 MED ORDER — HYDROMORPHONE HCL 1 MG/ML IJ SOLN
1.0000 mg | Freq: Once | INTRAMUSCULAR | Status: AC
  Administered 2022-06-04: 1 mg via INTRAVENOUS
  Filled 2022-06-04: qty 1

## 2022-06-04 MED ORDER — VENLAFAXINE HCL 37.5 MG PO TABS
75.0000 mg | ORAL_TABLET | Freq: Two times a day (BID) | ORAL | Status: DC
  Administered 2022-06-05 – 2022-06-07 (×5): 75 mg via ORAL
  Filled 2022-06-04 (×5): qty 2

## 2022-06-04 MED ORDER — MORPHINE SULFATE (PF) 2 MG/ML IV SOLN
2.0000 mg | INTRAVENOUS | Status: DC | PRN
  Administered 2022-06-04 – 2022-06-06 (×3): 2 mg via INTRAVENOUS
  Filled 2022-06-04 (×3): qty 1

## 2022-06-04 MED ORDER — FLUOXETINE HCL 10 MG PO CAPS
10.0000 mg | ORAL_CAPSULE | Freq: Every day | ORAL | Status: DC
  Administered 2022-06-05 – 2022-06-07 (×3): 10 mg via ORAL
  Filled 2022-06-04 (×3): qty 1

## 2022-06-04 MED ORDER — POLYETHYLENE GLYCOL 3350 17 G PO PACK
17.0000 g | PACK | Freq: Every day | ORAL | Status: DC
  Filled 2022-06-04 (×4): qty 1

## 2022-06-04 MED ORDER — LOSARTAN POTASSIUM 50 MG PO TABS
25.0000 mg | ORAL_TABLET | Freq: Every day | ORAL | Status: DC
  Administered 2022-06-05: 25 mg via ORAL
  Filled 2022-06-04: qty 1

## 2022-06-04 MED ORDER — ACETAMINOPHEN 650 MG RE SUPP: 650.0000 mg | Freq: Four times a day (QID) | RECTAL | Status: DC | PRN

## 2022-06-04 MED ORDER — SODIUM CHLORIDE 0.9 % IV SOLN
2.0000 g | Freq: Once | INTRAVENOUS | Status: AC
  Administered 2022-06-04: 2 g via INTRAVENOUS
  Filled 2022-06-04: qty 20

## 2022-06-04 MED ORDER — NICOTINE 21 MG/24HR TD PT24
21.0000 mg | MEDICATED_PATCH | Freq: Every day | TRANSDERMAL | Status: DC
  Administered 2022-06-04 – 2022-06-07 (×4): 21 mg via TRANSDERMAL
  Filled 2022-06-04 (×4): qty 1

## 2022-06-04 MED ORDER — CALCIUM ACETATE (PHOS BINDER) 667 MG PO CAPS
1334.0000 mg | ORAL_CAPSULE | Freq: Three times a day (TID) | ORAL | Status: DC
  Administered 2022-06-04 – 2022-06-07 (×9): 1334 mg via ORAL
  Filled 2022-06-04 (×10): qty 2

## 2022-06-04 MED ORDER — OXYCODONE HCL 5 MG PO TABS
5.0000 mg | ORAL_TABLET | ORAL | Status: DC | PRN
  Administered 2022-06-04 – 2022-06-06 (×3): 5 mg via ORAL
  Filled 2022-06-04 (×4): qty 1

## 2022-06-04 MED ORDER — LACTATED RINGERS IV SOLN: INTRAVENOUS | Status: AC

## 2022-06-04 MED ORDER — LEVOTHYROXINE SODIUM 75 MCG PO TABS
175.0000 ug | ORAL_TABLET | Freq: Every day | ORAL | Status: DC
  Administered 2022-06-05 – 2022-06-07 (×3): 175 ug via ORAL
  Filled 2022-06-04 (×3): qty 1

## 2022-06-04 MED ORDER — SODIUM CHLORIDE 0.9 % IV BOLUS
1000.0000 mL | Freq: Once | INTRAVENOUS | Status: AC
  Administered 2022-06-04: 1000 mL via INTRAVENOUS

## 2022-06-04 MED ORDER — ONDANSETRON HCL 4 MG/2ML IJ SOLN
4.0000 mg | Freq: Four times a day (QID) | INTRAMUSCULAR | Status: DC | PRN
  Administered 2022-06-05 – 2022-06-06 (×4): 4 mg via INTRAVENOUS
  Filled 2022-06-04 (×4): qty 2

## 2022-06-04 MED ORDER — ONDANSETRON HCL 4 MG PO TABS: 4.0000 mg | ORAL_TABLET | Freq: Four times a day (QID) | ORAL | Status: DC | PRN

## 2022-06-04 MED ORDER — ACETAMINOPHEN 325 MG PO TABS
650.0000 mg | ORAL_TABLET | Freq: Four times a day (QID) | ORAL | Status: DC | PRN
  Administered 2022-06-05: 650 mg via ORAL
  Filled 2022-06-04: qty 2

## 2022-06-04 NOTE — H&P (Signed)
History and Physical    Patient: Tanya Wright [id number] DOB: Nov 30, 1965 DOA: 06/04/2022 DOS: the patient was seen and examined on 06/04/2022 PCP: Patient, No Pcp Per  Patient coming from: Home  Chief Complaint:  Chief Complaint  Patient presents with   Diarrhea   HPI: Tanya Wright is a 57 y.o. female with medical history significant of cancer, hypertension, CKD stage IV, thyroid disease, depression, and more presents the ED with a chief complaint of myalgias and diarrhea.  Patient reports that myalgias and diarrhea started at the same time 3 days ago.  The diarrhea occurs all day she cannot count how many times.  She describes it as nonbloody.  She denies abdominal pain.  She denies nausea and vomiting.  She reports that she has had a decreased appetite for about 3 days.  She has had dysuria for 2-3 days.  No hematuria.  Patient has felt feverish but has not measured a temperature.  Patient reports she has had chest pain and dyspnea that are nonexertional.  This started at the same time.  Patient denies cough.  Patient reports she used to be on Advair and it did help, but she has not been on it recently.  Patient is tearful during exam and reports that she is tired of being sick all the time.  She reports that she is irritable and is short with myself and the nurse.  She reports the Prozac does help with her mood.  Patient reports no other complaints at this time.  Patient does smoke and request nicotine patch.  She drinks once per week.  She uses marijuana and the last use was 2 weeks ago.  She is vaccinated for COVID and flu.  Patient is full code. Review of Systems: As mentioned in the history of present illness. All other systems reviewed and are negative. Past Medical History:  Diagnosis Date   Cancer ([place])    Cervical cancer ([place])    Hypertension    Renal disorder    Thyroid disease    Past Surgical History:  Procedure Laterality Date   IR NEPHROSTOMY PLACEMENT LEFT  09/28/2020    IR NEPHROSTOMY PLACEMENT RIGHT  09/28/2020   STENT PLACEMENT RT URETER ([place] HX)     sent in both kidneys for stones   TEE WITHOUT CARDIOVERSION N/A 09/29/2020   Procedure: TRANSESOPHAGEAL ECHOCARDIOGRAM (TEE) WITH PROPOFOL;  Surgeon: Fay Records, MD;  Location: [facility];  Service: Cardiovascular;  Laterality: N/A;   TUBAL LIGATION     Social History:  reports that she has quit smoking. Her smoking use included cigarettes. She has never used smokeless tobacco. She reports current alcohol use. She reports current drug use. Drug: Marijuana.  Allergies  Allergen Reactions   Oxycodone Itching    No family history on file.  Prior to Admission medications   Medication Sig Start Date End Date Taking? Authorizing Provider  acetaminophen (TYLENOL) 325 MG tablet Take 2 tablets (650 mg total) by mouth every 6 (six) hours as needed for mild pain (or Fever >/= 101). 10/04/20  Yes Barton Dubois, MD  calcium acetate (PHOSLO) 667 MG capsule Take 2 capsules (1,334 mg total) by mouth 3 (three) times daily with meals. 10/22/20  Yes Gerlene Fee, NP  halobetasol (ULTRAVATE) 0.05 % ointment Apply topically. 05/23/22 05/23/23 Yes [provider]  levothyroxine (SYNTHROID) 175 MCG tablet Take 1 tablet (175 mcg total) by mouth daily. 10/22/20  Yes Gerlene Fee, NP  lidocaine-prilocaine (EMLA) cream Apply topically. 10/29/20  Yes [provider]  oxyCODONE (OXY IR/ROXICODONE) 5 MG immediate release tablet Take 5 mg by mouth every 6 (six) hours as needed for moderate pain. 05/19/22 06/18/22 Yes [provider]  oxyCODONE ER 9 MG C12A Take 9 mg by mouth every 12 (twelve) hours. 05/11/22 06/10/22 Yes [provider]  venlafaxine XR (EFFEXOR-XR) 75 MG 24 hr capsule Take 75 mg by mouth daily with breakfast.   Yes [provider]  FLUoxetine (PROZAC) 10 MG capsule Take 1 capsule (10 mg total) by mouth daily. Patient not taking: Reported on 06/04/2022 10/22/20   Gerlene Fee, NP   losartan (COZAAR) 25 MG tablet Take 25 mg by mouth daily. Patient not taking: Reported on 06/04/2022    [provider]  senna (SENOKOT) 8.6 MG tablet Take 1 tablet by mouth at bedtime. Patient not taking: Reported on 06/04/2022    [provider]    Physical Exam: Vitals:   06/04/22 1130 06/04/22 1343 06/04/22 1658 06/04/22 1715  BP:  131/83  103/71  Pulse:  98  92  Resp:  18  20  Temp:   98.7 F (37.1 C) 99.1 F (37.3 C)  TempSrc:   Oral Oral  SpO2:  98%  93%  Weight: 89.4 kg   84.8 kg  Height: 5' 11"$  (1.803 m)   5' 11"$  (1.803 m)   1.  General: Patient lying supine in bed,  no acute distress   2. Psychiatric: Alert and oriented x 3, patient is irritable but cooperative with exam   3. Neurologic: Speech and language are normal, face is symmetric, moves all 4 extremities voluntarily, at baseline without acute deficits on limited exam   4. HEENMT:  Head is atraumatic, normocephalic, pupils reactive to light, neck is supple, trachea is midline, mucous membranes are moist, poor dentition   5. Respiratory : Lungs are clear to auscultation bilaterally without wheezing, rhonchi, rales, no cyanosis, no increase in work of breathing or accessory muscle use   6. Cardiovascular : Heart rate normal, rhythm is regular, no murmurs, rubs or gallops, no peripheral edema, peripheral pulses palpated   7. Gastrointestinal:  Abdomen is soft, nondistended, nontender to palpation bowel sounds active, no masses or organomegaly palpated   8. Skin:  Skin is warm, dry and intact without rashes, acute lesions, or ulcers on limited exam   9.Musculoskeletal:  No acute deformities or trauma, no asymmetry in tone, no peripheral edema, peripheral pulses palpated, no tenderness to palpation in the extremities  Data Reviewed: In the ED Temp 98.9, heart rate 98-122, respiratory rate 18-22, blood pressure 115/79-131/83 No leukocytosis with a white blood cell count of 5.5, hemoglobin  12.2, platelets 195 Chemistry reveals an elevated BUN at 63, elevated creatinine at 4.51 AST elevated at 56, ALT elevated at 61 Blood culture pending See below for CT renal stone study results UA indicative of UTI Admission requested for further management of sepsis secondary to UTI complicated by nephroureteral stent and persistent left hydronephrosis Assessment and Plan: * AKI (acute kidney injury) ([place]) - Acute injury on chronic renal disease - GFR today 11, previously 27, 26 - Creatinine today 4.51, previously 2.13, 2.16 - CT renal stone study shows bilateral double-J nephroureteral stents with persistent moderate left hydronephrosis.  Prominent urothelial thickening of the left proximal ureter and left renal pelvis with adjacent fat stranding similar to previous - Urology consulted and recommended hydration, treating UTI, urology consult in the a.m. - 1 L bolus in ED - Continuing IV fluids -  Avoid nephrotoxic agents when possible Trend with a.m. labs  CKD (chronic kidney disease), stage IV ([facility]) - With GFR is running in the mid to low 20s at baseline - Today GFR is 11 with acute kidney injury - Continue treatment plan per AKI - Continue to monitor  Tobacco use disorder - Nicotine patch ordered - Patient will require smoking cessation counseling when she is in a less anxious state  Major depression, recurrent, chronic ([place]) - Tearful during exam - Reports currently taking Prozac and Effexor - Continue these medications - Continue to monitor  Hypothyroidism - Acquired - Continue Synthroid - Continue to monitor  Sepsis ([place]) - Heart rate 122, respiratory rate 22, suspected source is urinary tract, with AKI -Patient reports dysuria, CT renal stone study shows persistent left hydronephrosis, thickening of left proximal ureter and left renal pelvis - UA shows positive nitrites, more than 50 white blood cells, many bacteria - Urology was consulted and recommended admission  [facility] to [facility] with local urology being able to round in the a.m. - Continue Rocephin - Blood culture and urine culture pending - Lactic acid negative x 2 at 0.7 and 0.6 - 1 L bolus in ED - Continue with IV fluids - Continue to monitor  UTI (urinary tract infection) - UA indicative of UTI - Continue Rocephin - See plan for sepsis      Advance Care Planning:   Code Status: Full Code  Consults: Urology  Family Communication: No  Severity of Illness: The appropriate patient status for this patient is INPATIENT. Inpatient status is judged to be reasonable and necessary in order to provide the required intensity of service to ensure the patient's safety. The patient's presenting symptoms, physical exam findings, and initial radiographic and laboratory data in the context of their chronic comorbidities is felt to place them at high risk for further clinical deterioration. Furthermore, it is not anticipated that the patient will be medically stable for discharge from the hospital within 2 midnights of admission.   * I certify that at the point of admission it is my clinical judgment that the patient will require inpatient hospital care spanning beyond 2 midnights from the point of admission due to high intensity of service, high risk for further deterioration and high frequency of surveillance required.*  Author: Rolla Plate, DO 06/04/2022 6:17 PM  For on call review [email or web address].

## 2022-06-04 NOTE — Assessment & Plan Note (Signed)
-   Acute injury on chronic renal disease - GFR today 11, previously 27, 26 - Creatinine today 4.51, previously 2.13, 2.16 - CT renal stone study shows bilateral double-J nephroureteral stents with persistent moderate left hydronephrosis.  Prominent urothelial thickening of the left proximal ureter and left renal pelvis with adjacent fat stranding similar to previous - Urology consulted and recommended hydration, treating UTI, urology consult in the a.m. - 1 L bolus in ED - Continuing IV fluids - Avoid nephrotoxic agents when possible Trend with a.m. labs

## 2022-06-04 NOTE — Assessment & Plan Note (Signed)
-   Tearful during exam - Reports currently taking Prozac and Effexor - Continue these medications - Continue to monitor

## 2022-06-04 NOTE — Assessment & Plan Note (Signed)
-   Acquired - Continue Synthroid - Continue to monitor

## 2022-06-04 NOTE — ED Provider Notes (Addendum)
[facility] Provider Note   CSN: [id number] Arrival date & time: 06/04/22  1122     History  Chief Complaint  Patient presents with   Diarrhea    Tanya Wright is a 57 y.o. female.   Diarrhea Patient presents with chills diarrhea weakness.  Started on Friday.  Feeling bad over the last couple days.  Somewhat decreased oral intake.  History of chemotherapy/Keytruda for cervical cancer.  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  No dysuria.  States she hurts all over.  Was due to have her chemotherapy infusion on Friday but felt too sick.  States she feels if she has been having good oral intake.  No cough.  No known sick contacts.  Has been able to keep her pain medicines down.     Home Medications Prior to Admission medications   Medication Sig Start Date End Date Taking? Authorizing Provider  acetaminophen (TYLENOL) 325 MG tablet Take 2 tablets (650 mg total) by mouth every 6 (six) hours as needed for mild pain (or Fever >/= 101). 10/04/20   Barton Dubois, MD  calcium acetate (PHOSLO) 667 MG capsule Take 2 capsules (1,334 mg total) by mouth 3 (three) times daily with meals. 10/22/20   Gerlene Fee, NP  FLUoxetine (PROZAC) 10 MG capsule Take 1 capsule (10 mg total) by mouth daily. 10/22/20   Gerlene Fee, NP  levothyroxine (SYNTHROID) 175 MCG tablet Take 1 tablet (175 mcg total) by mouth daily. 10/22/20   Gerlene Fee, NP  polyethylene glycol (MIRALAX / GLYCOLAX) packet Take 17 g by mouth daily. 02/22/17   [name], MD  senna (SENOKOT) 8.6 MG tablet Take 1 tablet by mouth at bedtime.    [provider]      Allergies    Oxycodone    Review of Systems   Review of Systems  Gastrointestinal:  Positive for diarrhea.    Physical Exam Updated Vital Signs BP 131/83 (BP Location: Left Arm)   Pulse 98   Temp 98.9 F (37.2 C)   Resp 18   Ht 5' 11"$  (1.803 m)   Wt 89.4 kg   LMP 02/28/2017   SpO2  98%   BMI 27.48 kg/m  Physical Exam Vitals and nursing note reviewed.  HENT:     Head: Atraumatic.  Eyes:     Pupils: Pupils are equal, round, and reactive to light.  Cardiovascular:     Rate and Rhythm: Regular rhythm. Tachycardia present.  Abdominal:     Tenderness: There is abdominal tenderness.     Comments: Mild diffuse tenderness.  Worse on right side.  Musculoskeletal:        General: No tenderness.     Cervical back: Neck supple.  Neurological:     Mental Status: She is alert and oriented to person, place, and time.     ED Results / Procedures / Treatments   Labs (all labs ordered are listed, but only abnormal results are displayed) Labs Reviewed  URINALYSIS, W/ REFLEX TO CULTURE (INFECTION SUSPECTED) - Abnormal; Notable for the following components:      Result Value   Color, Urine AMBER (*)    APPearance CLOUDY (*)    Hgb urine dipstick MODERATE (*)    Protein, ur 100 (*)    Nitrite POSITIVE (*)    Leukocytes,Ua MODERATE (*)    Bacteria, UA MANY (*)    All other components within normal limits  COMPREHENSIVE METABOLIC  PANEL - Abnormal; Notable for the following components:   Sodium 132 (*)    CO2 20 (*)    Glucose, Bld 108 (*)    BUN 63 (*)    Creatinine, Ser 4.51 (*)    Calcium 8.4 (*)    Albumin 2.9 (*)    AST 56 (*)    ALT 61 (*)    GFR, Estimated 11 (*)    All other components within normal limits  CULTURE, BLOOD (ROUTINE X 2)  CULTURE, BLOOD (ROUTINE X 2)  RESP PANEL BY RT-PCR (RSV, FLU A&B, COVID)  [id number]  URINE CULTURE  CBC WITH DIFFERENTIAL/PLATELET  LACTIC ACID, PLASMA  LACTIC ACID, PLASMA  CK    EKG None  Radiology CT Renal Stone Study  Result Date: 06/04/2022 CLINICAL DATA:  Flank pain.  Cervical cancer.  On chemotherapy EXAM: CT ABDOMEN AND PELVIS WITHOUT CONTRAST TECHNIQUE: Multidetector CT imaging of the abdomen and pelvis was performed following the standard protocol without IV contrast. RADIATION DOSE REDUCTION: This exam was  performed according to the departmental dose-optimization program which includes automated exposure control, adjustment of the mA and/or kV according to patient size and/or use of iterative reconstruction technique. COMPARISON:  09/26/2020 FINDINGS: Lower chest: Mild bibasilar atelectasis.  Normal heart size. Hepatobiliary: Unremarkable unenhanced appearance of the liver. No focal liver lesion identified. Gallbladder within normal limits. No hyperdense gallstone. No biliary dilatation. Pancreas: Unremarkable. No pancreatic ductal dilatation or surrounding inflammatory changes. Spleen: Normal in size without focal abnormality. Adrenals/Urinary Tract: Unremarkable adrenal glands. Bilateral double-J nephroureteral stents. The proximal pigtails of both stents are positioned within upper pole calices. Distal pigtails are well positioned within the urinary bladder lumen. No right-sided hydronephrosis. Moderate left hydronephrosis. Prominent urothelial thickening of the left proximal ureter and left renal pelvis with adjacent fat stranding, which is similar in appearance to the previous study. No renal or ureteral stone. No urinary bladder wall thickening. Stomach/Bowel: Stomach is within normal limits. Appendix appears normal (series 5, image 58). No evidence of bowel wall thickening, distention, or inflammatory changes. Vascular/Lymphatic: Aortoiliac atherosclerosis without aneurysm. Redemonstration of numerous enlarged, calcified retroperitoneal, bilateral iliac, and bilateral pelvic sidewall lymph nodes. No evidence of progressive lymphadenopathy within the abdomen or pelvis. Reproductive: Uterus and bilateral adnexa are unremarkable. No fluid is seen within the endometrial canal on today's study. Other: Trace free fluid within the pelvis. No organized fluid collections. No pneumoperitoneum. No abdominal wall hernia. Musculoskeletal: No new or acute osseous abnormality. No suspicious lytic or sclerotic bone lesion is  identified. IMPRESSION: 1. Bilateral double-J nephroureteral stents with positioning as described above. Persistent moderate left hydronephrosis. 2. Prominent urothelial thickening of the left proximal ureter and left renal pelvis with adjacent fat stranding, which is similar in appearance to the previous study and could be secondary to an infectious or inflammatory process. Correlate with urinalysis. 3. Redemonstration of numerous enlarged, calcified retroperitoneal, bilateral iliac, and bilateral pelvic sidewall lymph nodes consistent with treated nodal metastatic disease. 4. Aortic atherosclerosis (ICD10-I70.0). Electronically Signed   By: Davina Poke D.O.   On: 06/04/2022 14:32   DG Chest Portable 1 View  Result Date: 06/04/2022 CLINICAL DATA:  Weakness. Diarrhea and dizziness. Recent chemotherapy for cervical carcinoma. EXAM: PORTABLE CHEST 1 VIEW COMPARISON:  09/23/2020. FINDINGS: Cardiac silhouette is normal in size. No mediastinal or hilar masses. Clear lungs.  No pleural effusion or pneumothorax. Stable right anterior chest wall Port-A-Cath. Skeletal structures are grossly intact. IMPRESSION: No active disease. Electronically Signed   By: Lajean Manes  M.D.   On: 06/04/2022 14:28    Procedures Procedures    Medications Ordered in ED Medications  cefTRIAXone (ROCEPHIN) 2 g in sodium chloride 0.9 % 100 mL IVPB (has no administration in time range)  sodium chloride 0.9 % bolus 1,000 mL (1,000 mLs Intravenous New Bag/Given 06/04/22 1212)  HYDROmorphone (DILAUDID) injection 1 mg (1 mg Intravenous Given 06/04/22 1344)    ED Course/ Medical Decision Making/ A&P                             Medical Decision Making Amount and/or Complexity of Data Reviewed Labs: ordered. Radiology: ordered.  Risk Prescription drug management.   Patient with pain.  Potentially some chills.  Myalgias. having some diarrhea.  On Keytruda but missed most recent dose.  Differential diagnosis includes  infection, UTI, gastroenteritis.  White count reassuring however new acute kidney injury with creatinine up to 4.5 with baseline of 2.22 months ago.  Will require admission in the hospital.  Will get noncontrast CT scan to evaluate for intra-abdominal pathology such as obstructive disease.  Still pending urinalysis.  Will add COVID and flu testing pending chest x-ray also  Chest x-ray reassuring.  CT scan shows potentially some irritation of the left kidney.  Stent appear grossly intact.  With kidney function will require admission to the hospital.  Would rather stay here if possible.  Urinalysis still pending.  Care turned over to oncoming provider.  Does not appear septic at this time.  Urinalysis shows potential infection.  Even with the stents this could just be contaminant but with overall illness I believe it is worth treating.  Will discuss with hospitalist for admission.  Discussed with hospitalist.  She request that discussed with urology first.  Discussed with Dr. Milford Cage from urology.  Thinks the patient can start here.  Hydrate and see if the creatinine improves.  If does not potentially could require a stent exchange.  That could be done either by Dr. Alyson Ingles or transfer to [facility].  If becomes septic could potentially need more acute treatment but for now appears stable here.          Final Clinical Impression(s) / ED Diagnoses Final diagnoses:  AKI (acute kidney injury) ([place])  Urinary tract infection without hematuria, site unspecified    Rx / DC Orders ED Discharge Orders     None         Davonna Belling, MD 06/04/22 1450    Davonna Belling, MD 06/04/22 1511    Davonna Belling, MD 06/04/22 1544

## 2022-06-04 NOTE — Assessment & Plan Note (Signed)
-   Heart rate 122, respiratory rate 22, suspected source is urinary tract, with AKI -Patient reports dysuria, CT renal stone study shows persistent left hydronephrosis, thickening of left proximal ureter and left renal pelvis - UA shows positive nitrites, more than 50 white blood cells, many bacteria - Urology was consulted and recommended admission here to [facility] with local urology being able to round in the a.m. - Continue Rocephin - Blood culture and urine culture pending - Lactic acid negative x 2 at 0.7 and 0.6 - 1 L bolus in ED - Continue with IV fluids - Continue to monitor

## 2022-06-04 NOTE — ED Triage Notes (Signed)
Pt c/o diarrhea, dizziness, fever, weakness that started Friday, currently receiving chemo for cervical cancer.

## 2022-06-04 NOTE — Assessment & Plan Note (Signed)
-   With GFR is running in the mid to low 20s at baseline - Today GFR is 11 with acute kidney injury - Continue treatment plan per AKI - Continue to monitor

## 2022-06-04 NOTE — Assessment & Plan Note (Signed)
-   UA indicative of UTI - Continue Rocephin - See plan for sepsis

## 2022-06-04 NOTE — Assessment & Plan Note (Signed)
-   Nicotine patch ordered - Patient will require smoking cessation counseling when she is in a less anxious state

## 2022-06-05 DIAGNOSIS — N179 Acute kidney failure, unspecified: Secondary | ICD-10-CM | POA: Diagnosis not present

## 2022-06-05 LAB — CBC WITH DIFFERENTIAL/PLATELET
Abs Immature Granulocytes: 0.08 10*3/uL — ABNORMAL HIGH (ref 0.00–0.07)
Basophils Absolute: 0 10*3/uL (ref 0.0–0.1)
Basophils Relative: 0 %
Eosinophils Absolute: 0.1 10*3/uL (ref 0.0–0.5)
Eosinophils Relative: 2 %
HCT: 32.7 % — ABNORMAL LOW (ref 36.0–46.0)
Hemoglobin: 10.5 g/dL — ABNORMAL LOW (ref 12.0–15.0)
Immature Granulocytes: 2 %
Lymphocytes Relative: 23 %
Lymphs Abs: 1.2 10*3/uL (ref 0.7–4.0)
MCH: 29.7 pg (ref 26.0–34.0)
MCHC: 32.1 g/dL (ref 30.0–36.0)
MCV: 92.4 fL (ref 80.0–100.0)
Monocytes Absolute: 0.8 10*3/uL (ref 0.1–1.0)
Monocytes Relative: 15 %
Neutro Abs: 3.2 10*3/uL (ref 1.7–7.7)
Neutrophils Relative %: 58 %
Platelets: 211 10*3/uL (ref 150–400)
RBC: 3.54 MIL/uL — ABNORMAL LOW (ref 3.87–5.11)
RDW: 14.8 % (ref 11.5–15.5)
WBC: 5.5 10*3/uL (ref 4.0–10.5)
nRBC: 0 % (ref 0.0–0.2)

## 2022-06-05 LAB — COMPREHENSIVE METABOLIC PANEL
ALT: 44 U/L (ref 0–44)
AST: 32 U/L (ref 15–41)
Albumin: 2.5 g/dL — ABNORMAL LOW (ref 3.5–5.0)
Alkaline Phosphatase: 99 U/L (ref 38–126)
Anion gap: 10 (ref 5–15)
BUN: 61 mg/dL — ABNORMAL HIGH (ref 6–20)
CO2: 21 mmol/L — ABNORMAL LOW (ref 22–32)
Calcium: 8 mg/dL — ABNORMAL LOW (ref 8.9–10.3)
Chloride: 103 mmol/L (ref 98–111)
Creatinine, Ser: 3.76 mg/dL — ABNORMAL HIGH (ref 0.44–1.00)
GFR, Estimated: 13 mL/min — ABNORMAL LOW (ref >60)
Glucose, Bld: 107 mg/dL — ABNORMAL HIGH (ref 70–99)
Potassium: 4.4 mmol/L (ref 3.5–5.1)
Sodium: 134 mmol/L — ABNORMAL LOW (ref 135–145)
Total Bilirubin: 0.5 mg/dL (ref 0.3–1.2)
Total Protein: 6.4 g/dL — ABNORMAL LOW (ref 6.5–8.1)

## 2022-06-05 LAB — MAGNESIUM: Magnesium: 2 mg/dL (ref 1.7–2.4)

## 2022-06-05 NOTE — Progress Notes (Signed)
PROGRESS NOTE     Tanya Wright, is a 57 y.o. female, DOB - 01-Jan-1966, [id number]  Admit date - 06/04/2022   Admitting Physician Rolla Plate, DO  Outpatient Primary MD for the patient is Patient, No Pcp Per  LOS - 1  Chief Complaint  Patient presents with   Diarrhea        Brief Narrative:   57 y.o. female with medical history significant of cancer, hypertension, CKD stage IV, thyroid disease, depression admitted on 06/04/2022 with AKI and concerns about possible complicated UTI in the setting of indwelling ureteral stent   -Assessment and Plan: 1) AKI on CKD IV in the setting of obstructive uropathy and left-sided hydronephrosis - Acute injury on chronic renal disease -Baseline Creatinine-- 2.0 to 2.2 - Creatinine on admission 4.51 -Creatinine currently trending down - CT renal stone study shows bilateral double-J nephroureteral stents with persistent moderate left hydronephrosis.  Prominent urothelial thickening of the left proximal ureter and left renal pelvis with adjacent fat stranding similar to previous -Continue IV fluids -Urology consult pending -Patient usually sees urologist at [facility]---she gets her ureteral stent changed every 6 months -Hold losartan -Renally adjust medications, avoid nephrotoxic agents / dehydration  / hypotension  2) sepsis due to presumed complicated UTI-- in the setting of obstructive uropathy and left-sided hydronephrosis Please see #1 above -Please see imaging studies as above #1 -Continue IV Rocephin pending urine culture - 3)Major depression, recurrent, chronic (HCC) -continue Prozac and Effexor  4)Hypothyroidism -c/n Levothyroxine  5)Tobacco use disorder --Continue nicotine patch  Status is: Inpatient   Disposition: The patient is from: Home              Anticipated d/c is to: Home              Anticipated d/c date is: 2 days              Patient currently is not medically stable to d/c. Barriers: Not Clinically  Stable-   Code Status :-  Code Status: Full Code   Family Communication:    NA (patient is alert, awake and coherent)   DVT Prophylaxis  :   - SCDs   heparin injection 5,000 Units Start: 06/04/22 2200 SCDs Start: 06/04/22 1635   Lab Results  Component Value Date   PLT 211 06/05/2022    Inpatient Medications  Scheduled Meds:  calcium acetate  1,334 mg Oral TID WC   FLUoxetine  10 mg Oral Daily   heparin  5,000 Units Subcutaneous Q8H   levothyroxine  175 mcg Oral Daily   losartan  25 mg Oral Daily   nicotine  21 mg Transdermal Daily   polyethylene glycol  17 g Oral Daily   senna  1 tablet Oral QHS   venlafaxine  75 mg Oral BID WC   Continuous Infusions:  cefTRIAXone (ROCEPHIN)  IV Stopped (06/05/22 1042)   PRN Meds:.acetaminophen **OR** acetaminophen, hydrOXYzine, morphine injection, ondansetron **OR** ondansetron (ZOFRAN) IV, oxyCODONE   Anti-infectives (From admission, onward)    Start     Dose/Rate Route Frequency Ordered Stop   06/05/22 1000  cefTRIAXone (ROCEPHIN) 1 g in sodium chloride 0.9 % 100 mL IVPB        1 g 200 mL/hr over 30 Minutes Intravenous Every 24 hours 06/04/22 1808     06/04/22 1515  cefTRIAXone (ROCEPHIN) 2 g in sodium chloride 0.9 % 100 mL IVPB        2 g 200 mL/hr over 30 Minutes Intravenous  Once 06/04/22 1509 06/05/22 1001         Subjective: [name] today has no fevers, no emesis,  No chest pain,   Flank pain improving -Dysuria improving  Objective: Vitals:   06/04/22 2059 06/05/22 0109 06/05/22 0537 06/05/22 1615  BP: (!) 117/92 109/79 101/76 104/78  Pulse: 95 (!) 110 (!) 103 85  Resp: 18 18 18   $ Temp: 98.5 F (36.9 C) 99.5 F (37.5 C) 98.6 F (37 C)   TempSrc: Oral Oral Oral   SpO2: 100% 94% 100% 97%  Weight:      Height:        Intake/Output Summary (Last 24 hours) at 06/05/2022 1801 Last data filed at 06/05/2022 1732 Gross per 24 hour  Intake 2187.41 ml  Output --  Net 2187.41 ml   Filed Weights   06/04/22  1130 06/04/22 1715  Weight: 89.4 kg 84.8 kg    Physical Exam  Gen:- Awake Alert,  in no apparent distress  HEENT:- [place].AT, No sclera icterus Neck-Supple Neck,No JVD,.  Lungs-  CTAB , fair symmetrical air movement CV- S1, S2 normal, regular  Abd-  +ve B.Sounds, Abd Soft, No tenderness,   CVA area tenderness improving Extremity/Skin:- No  edema, pedal pulses present  Psych-affect is appropriate, oriented x3 Neuro-no new focal deficits, no tremors  Data Reviewed: I have personally reviewed following labs and imaging studies  CBC: Recent Labs  Lab 06/04/22 1150 06/05/22 0525  WBC 5.5 5.5  NEUTROABS 3.8 3.2  HGB 12.2 10.5*  HCT 37.2 32.7*  MCV 91.0 92.4  PLT 195 [id number]   Basic Metabolic Panel: Recent Labs  Lab 06/04/22 1150 06/05/22 0525  NA 132* 134*  K 4.5 4.4  CL 100 103  CO2 20* 21*  GLUCOSE 108* 107*  BUN 63* 61*  CREATININE 4.51* 3.76*  CALCIUM 8.4* 8.0*  MG  --  2.0   GFR: Estimated Creatinine Clearance: 18.5 mL/min (A) (by C-G formula based on SCr of 3.76 mg/dL (H)). Liver Function Tests: Recent Labs  Lab 06/04/22 1150 06/05/22 0525  AST 56* 32  ALT 61* 44  ALKPHOS 117 99  BILITOT 0.8 0.5  PROT 7.2 6.4*  ALBUMIN 2.9* 2.5*   Cardiac Enzymes: Recent Labs  Lab 06/04/22 1423  CKTOTAL 91   Recent Results (from the past 240 hour(s))  Culture, blood (routine x 2)     Status: None (Preliminary result)   Collection Time: 06/04/22 11:55 AM   Specimen: Right Antecubital; Blood  Result Value Ref Range Status   Specimen Description   Final    RIGHT ANTECUBITAL BOTTLES DRAWN AEROBIC AND ANAEROBIC   Special Requests Blood Culture adequate volume  Final   Culture   Final    NO GROWTH < 12 HOURS Performed at [facility], [address], [place], [place] [address]    Report Status PENDING  Incomplete  Culture, blood (routine x 2)     Status: None (Preliminary result)   Collection Time: 06/04/22 12:19 PM   Specimen: BLOOD LEFT ARM  Result Value Ref Range  Status   Specimen Description BLOOD LEFT ARM BOTTLES DRAWN AEROBIC AND ANAEROBIC  Final   Special Requests Blood Culture adequate volume  Final   Culture   Final    NO GROWTH < 12 HOURS Performed at [facility], [address], [place], [place] [address]    Report Status PENDING  Incomplete  Resp panel by RT-PCR (RSV, Flu A&B, Covid) Anterior Nasal Swab     Status:  None   Collection Time: 06/04/22  2:32 PM   Specimen: Anterior Nasal Swab  Result Value Ref Range Status   SARS Coronavirus 2 by RT PCR NEGATIVE NEGATIVE Final    Comment: (NOTE) SARS-CoV-2 target nucleic acids are NOT DETECTED.  The SARS-CoV-2 RNA is generally detectable in upper respiratory specimens during the acute phase of infection. The lowest concentration of SARS-CoV-2 viral copies this assay can detect is 138 copies/mL. A negative result does not preclude SARS-Cov-2 infection and should not be used as the sole basis for treatment or other patient management decisions. A negative result may occur with  improper specimen collection/handling, submission of specimen other than nasopharyngeal swab, presence of viral mutation(s) within the areas targeted by this assay, and inadequate number of viral copies(<138 copies/mL). A negative result must be combined with clinical observations, patient history, and epidemiological information. The expected result is Negative.  Fact Sheet for Patients:  EntrepreneurPulse.com.au  Fact Sheet for Healthcare Providers:  IncredibleEmployment.be  This test is no t yet approved or cleared by the [place] FDA and  has been authorized for detection and/or diagnosis of SARS-CoV-2 by FDA under an Emergency Use Authorization (EUA). This EUA will remain  in effect (meaning this test can be used) for the duration of the COVID-19 declaration under Section 564(b)(1) of the Act, 21 U.S.C.section 360bbb-3(b)(1), unless the authorization is terminated   or revoked sooner.       Influenza A by PCR NEGATIVE NEGATIVE Final   Influenza B by PCR NEGATIVE NEGATIVE Final    Comment: (NOTE) The Xpert Xpress SARS-CoV-2/FLU/RSV plus assay is intended as an aid in the diagnosis of influenza from Nasopharyngeal swab specimens and should not be used as a sole basis for treatment. Nasal washings and aspirates are unacceptable for Xpert Xpress SARS-CoV-2/FLU/RSV testing.  Fact Sheet for Patients: EntrepreneurPulse.com.au  Fact Sheet for Healthcare Providers: IncredibleEmployment.be  This test is not yet approved or cleared by the [place] FDA and has been authorized for detection and/or diagnosis of SARS-CoV-2 by FDA under an Emergency Use Authorization (EUA). This EUA will remain in effect (meaning this test can be used) for the duration of the COVID-19 declaration under Section 564(b)(1) of the Act, 21 U.S.C. section 360bbb-3(b)(1), unless the authorization is terminated or revoked.     Resp Syncytial Virus by PCR NEGATIVE NEGATIVE Final    Comment: (NOTE) Fact Sheet for Patients: EntrepreneurPulse.com.au  Fact Sheet for Healthcare Providers: IncredibleEmployment.be  This test is not yet approved or cleared by the [place] FDA and has been authorized for detection and/or diagnosis of SARS-CoV-2 by FDA under an Emergency Use Authorization (EUA). This EUA will remain in effect (meaning this test can be used) for the duration of the COVID-19 declaration under Section 564(b)(1) of the Act, 21 U.S.C. section 360bbb-3(b)(1), unless the authorization is terminated or revoked.  Performed at [facility], [address], [place], [place] [address]   Urine Culture     Status: Abnormal (Preliminary result)   Collection Time: 06/04/22  2:47 PM   Specimen: Urine, Random  Result Value Ref Range Status   Specimen Description   Final    URINE, RANDOM Performed at  [facility], [address] [place], [place] [address]    Special Requests   Final    NONE Reflexed from [id number] Performed at [facility], [address] [place], [place] [address]    Culture (A)  Final    >=100,000 COLONIES/mL GRAM NEGATIVE RODS IDENTIFICATION AND SUSCEPTIBILITIES TO  FOLLOW Performed at [facility], [place] [address], [place], [place] [address]    Report Status PENDING  Incomplete      Radiology Studies: CT Renal Stone Study  Result Date: 06/04/2022 CLINICAL DATA:  Flank pain.  Cervical cancer.  On chemotherapy EXAM: CT ABDOMEN AND PELVIS WITHOUT CONTRAST TECHNIQUE: Multidetector CT imaging of the abdomen and pelvis was performed following the standard protocol without IV contrast. RADIATION DOSE REDUCTION: This exam was performed according to the departmental dose-optimization program which includes automated exposure control, adjustment of the mA and/or kV according to patient size and/or use of iterative reconstruction technique. COMPARISON:  09/26/2020 FINDINGS: Lower chest: Mild bibasilar atelectasis.  Normal heart size. Hepatobiliary: Unremarkable unenhanced appearance of the liver. No focal liver lesion identified. Gallbladder within normal limits. No hyperdense gallstone. No biliary dilatation. Pancreas: Unremarkable. No pancreatic ductal dilatation or surrounding inflammatory changes. Spleen: Normal in size without focal abnormality. Adrenals/Urinary Tract: Unremarkable adrenal glands. Bilateral double-J nephroureteral stents. The proximal pigtails of both stents are positioned within upper pole calices. Distal pigtails are well positioned within the urinary bladder lumen. No right-sided hydronephrosis. Moderate left hydronephrosis. Prominent urothelial thickening of the left proximal ureter and left renal pelvis with adjacent fat stranding, which is similar in appearance to the previous study. No renal or ureteral stone. No urinary bladder wall thickening.  Stomach/Bowel: Stomach is within normal limits. Appendix appears normal (series 5, image 58). No evidence of bowel wall thickening, distention, or inflammatory changes. Vascular/Lymphatic: Aortoiliac atherosclerosis without aneurysm. Redemonstration of numerous enlarged, calcified retroperitoneal, bilateral iliac, and bilateral pelvic sidewall lymph nodes. No evidence of progressive lymphadenopathy within the abdomen or pelvis. Reproductive: Uterus and bilateral adnexa are unremarkable. No fluid is seen within the endometrial canal on today's study. Other: Trace free fluid within the pelvis. No organized fluid collections. No pneumoperitoneum. No abdominal wall hernia. Musculoskeletal: No new or acute osseous abnormality. No suspicious lytic or sclerotic bone lesion is identified. IMPRESSION: 1. Bilateral double-J nephroureteral stents with positioning as described above. Persistent moderate left hydronephrosis. 2. Prominent urothelial thickening of the left proximal ureter and left renal pelvis with adjacent fat stranding, which is similar in appearance to the previous study and could be secondary to an infectious or inflammatory process. Correlate with urinalysis. 3. Redemonstration of numerous enlarged, calcified retroperitoneal, bilateral iliac, and bilateral pelvic sidewall lymph nodes consistent with treated nodal metastatic disease. 4. Aortic atherosclerosis (ICD10-I70.0). Electronically Signed   By: Davina Poke D.O.   On: 06/04/2022 14:32   DG Chest Portable 1 View  Result Date: 06/04/2022 CLINICAL DATA:  Weakness. Diarrhea and dizziness. Recent chemotherapy for cervical carcinoma. EXAM: PORTABLE CHEST 1 VIEW COMPARISON:  09/23/2020. FINDINGS: Cardiac silhouette is normal in size. No mediastinal or hilar masses. Clear lungs.  No pleural effusion or pneumothorax. Stable right anterior chest wall Port-A-Cath. Skeletal structures are grossly intact. IMPRESSION: No active disease. Electronically Signed    By: Lajean Manes M.D.   On: 06/04/2022 14:28     Scheduled Meds:  calcium acetate  1,334 mg Oral TID WC   FLUoxetine  10 mg Oral Daily   heparin  5,000 Units Subcutaneous Q8H   levothyroxine  175 mcg Oral Daily   losartan  25 mg Oral Daily   nicotine  21 mg Transdermal Daily   polyethylene glycol  17 g Oral Daily   senna  1 tablet Oral QHS   venlafaxine  75 mg Oral BID WC   Continuous Infusions:  cefTRIAXone (ROCEPHIN)  IV Stopped (  06/05/22 1042)     LOS: 1 day    Roxan Hockey M.D on 06/05/2022 at 6:01 PM  Go to [email or web address] - for contact info  [facility] - Office  [phone]  If 7PM-7AM, please contact night-coverage [email or web address] 06/05/2022, 6:01 PM

## 2022-06-05 NOTE — TOC Progression Note (Signed)
  Transition of Care [facility]) Screening Note   Patient Details  Name: Tanya Wright Date of Birth: 04-Mar-1966   Transition of Care [facility]) CM/SW Contact:    Shade Flood, LCSW Phone Number: 06/05/2022, 9:34 AM    Transition of Care Department [facility]) has reviewed patient and no TOC needs have been identified at this time. We will continue to monitor patient advancement through interdisciplinary progression rounds. If new patient transition needs arise, please place a TOC consult.

## 2022-06-06 ENCOUNTER — Encounter (HOSPITAL_COMMUNITY): Payer: Self-pay | Admitting: Family Medicine

## 2022-06-06 DIAGNOSIS — N179 Acute kidney failure, unspecified: Secondary | ICD-10-CM | POA: Diagnosis not present

## 2022-06-06 DIAGNOSIS — N138 Other obstructive and reflux uropathy: Secondary | ICD-10-CM

## 2022-06-06 DIAGNOSIS — N1339 Other hydronephrosis: Secondary | ICD-10-CM

## 2022-06-06 DIAGNOSIS — N1330 Unspecified hydronephrosis: Secondary | ICD-10-CM | POA: Diagnosis not present

## 2022-06-06 DIAGNOSIS — N12 Tubulo-interstitial nephritis, not specified as acute or chronic: Secondary | ICD-10-CM | POA: Diagnosis not present

## 2022-06-06 LAB — BASIC METABOLIC PANEL
Anion gap: 12 (ref 5–15)
BUN: 51 mg/dL — ABNORMAL HIGH (ref 6–20)
CO2: 22 mmol/L (ref 22–32)
Calcium: 8.6 mg/dL — ABNORMAL LOW (ref 8.9–10.3)
Chloride: 101 mmol/L (ref 98–111)
Creatinine, Ser: 3.23 mg/dL — ABNORMAL HIGH (ref 0.44–1.00)
GFR, Estimated: 16 mL/min — ABNORMAL LOW (ref >60)
Glucose, Bld: 98 mg/dL (ref 70–99)
Potassium: 4.9 mmol/L (ref 3.5–5.1)
Sodium: 135 mmol/L (ref 135–145)

## 2022-06-06 MED ORDER — SODIUM CHLORIDE 0.9 % IV SOLN: INTRAVENOUS | Status: AC

## 2022-06-06 NOTE — Progress Notes (Signed)
PROGRESS NOTE     Tanya Wright, is a 57 y.o. female, DOB - Feb 05, 1966, [id number]  Admit date - 06/04/2022   Admitting Physician Rolla Plate, DO  Outpatient Primary MD for the patient is Patient, No Pcp Per  LOS - 2  Chief Complaint  Patient presents with   Diarrhea        Brief Narrative:   57 y.o. female with medical history significant of cancer, hypertension, CKD stage IV, thyroid disease, depression admitted on 06/04/2022 with AKI and concerns about possible complicated UTI in the setting of indwelling ureteral stent   -Assessment and Plan: 1) AKI on CKD IV in the setting of obstructive uropathy and left-sided hydronephrosis - Acute injury on chronic renal disease -Baseline Creatinine-- 2.0 to 2.2 - Creatinine on admission 4.51 04/06/23 -Creatinine currently trending down - CT renal stone study shows bilateral double-J nephroureteral stents with persistent moderate left hydronephrosis.  Prominent urothelial thickening of the left proximal ureter and left renal pelvis with adjacent fat stranding similar to previous -Continue IV fluids -Urology consult noted -Patient usually sees urologist at [facility]---she gets her ureteral stent changed every 6 months -Hold losartan -Renally adjust medications, avoid nephrotoxic agents / dehydration  / hypotension  2) sepsis due to complicated E coli UTI/PyeloNephritis-- in the setting of obstructive uropathy and left-sided hydronephrosis Please see #1 above -Please see imaging studies as above #1 06/06/22 -Continue IV Rocephin pending sensitivity -As per Urologist dc home on antibiotics when culture sensitivity is available... follow up at [facility] urology for stent exchange within next couple of weeks  - 3)Major depression, recurrent, chronic ([place]) -continue Prozac and Effexor  4)Hypothyroidism -c/n Levothyroxine  5)Tobacco use disorder --Continue nicotine patch  Status is: Inpatient   Disposition: The patient is from:  Home              Anticipated d/c is to: Home-----   -As per Urologist dc home on antibiotics when culture sensitivity is available... follow up at [facility] urology for stent exchange within next couple of weeks               Anticipated d/c date is: 1 day              Patient currently is not medically stable to d/c. Barriers: Not Clinically Stable-   Code Status :-  Code Status: Full Code   Family Communication:    NA (patient is alert, awake and coherent)   DVT Prophylaxis  :   - SCDs   heparin injection 5,000 Units Start: 06/04/22 2200 SCDs Start: 06/04/22 1635   Lab Results  Component Value Date   PLT 211 06/05/2022   Inpatient Medications  Scheduled Meds:  calcium acetate  1,334 mg Oral TID WC   FLUoxetine  10 mg Oral Daily   heparin  5,000 Units Subcutaneous Q8H   levothyroxine  175 mcg Oral Daily   nicotine  21 mg Transdermal Daily   polyethylene glycol  17 g Oral Daily   senna  1 tablet Oral QHS   venlafaxine  75 mg Oral BID WC   Continuous Infusions:  cefTRIAXone (ROCEPHIN)  IV Stopped (06/06/22 0937)   PRN Meds:.acetaminophen **OR** acetaminophen, hydrOXYzine, morphine injection, ondansetron **OR** ondansetron (ZOFRAN) IV, oxyCODONE   Anti-infectives (From admission, onward)    Start     Dose/Rate Route Frequency Ordered Stop   06/05/22 1000  cefTRIAXone (ROCEPHIN) 1 g in sodium chloride 0.9 % 100 mL IVPB        1  g 200 mL/hr over 30 Minutes Intravenous Every 24 hours 06/04/22 1808     06/04/22 1515  cefTRIAXone (ROCEPHIN) 2 g in sodium chloride 0.9 % 100 mL IVPB        2 g 200 mL/hr over 30 Minutes Intravenous  Once 06/04/22 1509 06/05/22 1001       Subjective: [name] today has no fevers, no emesis,  No chest pain,   Flank pain improving -Dysuria improving Eating and drinking well -voiding well  Objective: Vitals:   06/05/22 1615 06/05/22 2217 06/06/22 0442 06/06/22 1538  BP: 104/78 (!) 118/95 105/76 132/88  Pulse: 85 89 91 88  Resp:  18 20 20   $ Temp:  98.3 F (36.8 C) 98.7 F (37.1 C) 98.4 F (36.9 C)  TempSrc:    Oral  SpO2: 97% 100% 98% 100%  Weight:      Height:        Intake/Output Summary (Last 24 hours) at 06/06/2022 1752 Last data filed at 06/06/2022 1300 Gross per 24 hour  Intake 817.66 ml  Output --  Net 817.66 ml   Filed Weights   06/04/22 1130 06/04/22 1715  Weight: 89.4 kg 84.8 kg    Physical Exam  Gen:- Awake Alert,  in no apparent distress  HEENT:- [place].AT, No sclera icterus Neck-Supple Neck,No JVD,.  Lungs-  CTAB , fair symmetrical air movement CV- S1, S2 normal, regular  Abd-  +ve B.Sounds, Abd Soft, No tenderness,   CVA area tenderness improving Extremity/Skin:- No  edema, pedal pulses present  Psych-affect is appropriate, oriented x3 Neuro-no new focal deficits, no tremors  Data Reviewed: I have personally reviewed following labs and imaging studies  CBC: Recent Labs  Lab 06/04/22 1150 06/05/22 0525  WBC 5.5 5.5  NEUTROABS 3.8 3.2  HGB 12.2 10.5*  HCT 37.2 32.7*  MCV 91.0 92.4  PLT 195 [id number]   Basic Metabolic Panel: Recent Labs  Lab 06/04/22 1150 06/05/22 0525 06/06/22 1025  NA 132* 134* 135  K 4.5 4.4 4.9  CL 100 103 101  CO2 20* 21* 22  GLUCOSE 108* 107* 98  BUN 63* 61* 51*  CREATININE 4.51* 3.76* 3.23*  CALCIUM 8.4* 8.0* 8.6*  MG  --  2.0  --    GFR: Estimated Creatinine Clearance: 21.5 mL/min (A) (by C-G formula based on SCr of 3.23 mg/dL (H)). Liver Function Tests: Recent Labs  Lab 06/04/22 1150 06/05/22 0525  AST 56* 32  ALT 61* 44  ALKPHOS 117 99  BILITOT 0.8 0.5  PROT 7.2 6.4*  ALBUMIN 2.9* 2.5*   Cardiac Enzymes: Recent Labs  Lab 06/04/22 1423  CKTOTAL 91   Recent Results (from the past 240 hour(s))  Culture, blood (routine x 2)     Status: None (Preliminary result)   Collection Time: 06/04/22 11:55 AM   Specimen: Right Antecubital; Blood  Result Value Ref Range Status   Specimen Description   Final    RIGHT ANTECUBITAL BOTTLES DRAWN AEROBIC AND  ANAEROBIC Performed at [facility], [address] [place], [place] [address]    Special Requests   Final    Blood Culture adequate volume Performed at [facility], [address], [place], [place] [address]    Culture   Final    NO GROWTH 2 DAYS Performed at [facility], [place] [address] [place], [place] [address]    Report Status PENDING  Incomplete  Culture, blood (routine x 2)     Status: None (Preliminary result)  Collection Time: 06/04/22 12:19 PM   Specimen: BLOOD LEFT ARM  Result Value Ref Range Status   Specimen Description   Final    BLOOD LEFT ARM BOTTLES DRAWN AEROBIC AND ANAEROBIC Performed at [facility], [address], [place], [place] [address]    Special Requests   Final    Blood Culture adequate volume Performed at [facility], [address] [place], [place] [address]    Culture   Final    NO GROWTH 2 DAYS Performed at [facility], [place] [address] [place],  [address]    Report Status PENDING  Incomplete  Resp panel by RT-PCR (RSV, Flu A&B, Covid) Anterior Nasal Swab     Status: None   Collection Time: 06/04/22  2:32 PM   Specimen: Anterior Nasal Swab  Result Value Ref Range Status   SARS Coronavirus 2 by RT PCR NEGATIVE NEGATIVE Final    Comment: (NOTE) SARS-CoV-2 target nucleic acids are NOT DETECTED.  The SARS-CoV-2 RNA is generally detectable in upper respiratory specimens during the acute phase of infection. The lowest concentration of SARS-CoV-2 viral copies this assay can detect is 138 copies/mL. A negative result does not preclude SARS-Cov-2 infection and should not be used as the sole basis for treatment or other patient management decisions. A negative result may occur with  improper specimen collection/handling, submission of specimen other than nasopharyngeal swab, presence of viral mutation(s) within the areas targeted by this assay, and inadequate number of viral copies(<138 copies/mL). A negative result must be  combined with clinical observations, patient history, and epidemiological information. The expected result is Negative.  Fact Sheet for Patients:  EntrepreneurPulse.com.au  Fact Sheet for Healthcare Providers:  IncredibleEmployment.be  This test is no t yet approved or cleared by the [place] FDA and  has been authorized for detection and/or diagnosis of SARS-CoV-2 by FDA under an Emergency Use Authorization (EUA). This EUA will remain  in effect (meaning this test can be used) for the duration of the COVID-19 declaration under Section 564(b)(1) of the Act, 21 U.S.C.section 360bbb-3(b)(1), unless the authorization is terminated  or revoked sooner.       Influenza A by PCR NEGATIVE NEGATIVE Final   Influenza B by PCR NEGATIVE NEGATIVE Final    Comment: (NOTE) The Xpert Xpress SARS-CoV-2/FLU/RSV plus assay is intended as an aid in the diagnosis of influenza from Nasopharyngeal swab specimens and should not be used as a sole basis for treatment. Nasal washings and aspirates are unacceptable for Xpert Xpress SARS-CoV-2/FLU/RSV testing.  Fact Sheet for Patients: EntrepreneurPulse.com.au  Fact Sheet for Healthcare Providers: IncredibleEmployment.be  This test is not yet approved or cleared by the [place] FDA and has been authorized for detection and/or diagnosis of SARS-CoV-2 by FDA under an Emergency Use Authorization (EUA). This EUA will remain in effect (meaning this test can be used) for the duration of the COVID-19 declaration under Section 564(b)(1) of the Act, 21 U.S.C. section 360bbb-3(b)(1), unless the authorization is terminated or revoked.     Resp Syncytial Virus by PCR NEGATIVE NEGATIVE Final    Comment: (NOTE) Fact Sheet for Patients: EntrepreneurPulse.com.au  Fact Sheet for Healthcare Providers: IncredibleEmployment.be  This test is not yet  approved or cleared by the [place] FDA and has been authorized for detection and/or diagnosis of SARS-CoV-2 by FDA under an Emergency Use Authorization (EUA). This EUA will remain in effect (meaning this test can be used) for the duration of the COVID-19 declaration under Section 564(b)(1) of  the Act, 21 U.S.C. section 360bbb-3(b)(1), unless the authorization is terminated or revoked.  Performed at [facility], [address] [place], [place] [address]   Urine Culture     Status: Abnormal (Preliminary result)   Collection Time: 06/04/22  2:47 PM   Specimen: Urine, Random  Result Value Ref Range Status   Specimen Description   Final    URINE, RANDOM Performed at [facility], [address], [place], [place] [address]    Special Requests   Final    NONE Reflexed from [id number] Performed at [facility], [address] [place], [place] [address]    Culture (A)  Final    >=100,000 COLONIES/mL ESCHERICHIA COLI CULTURE REINCUBATED FOR BETTER GROWTH SUSCEPTIBILITIES TO FOLLOW Performed at [place] [address] [place], [place] [address]    Report Status PENDING  Incomplete    Radiology Studies: No results found.   Scheduled Meds:  calcium acetate  1,334 mg Oral TID WC   FLUoxetine  10 mg Oral Daily   heparin  5,000 Units Subcutaneous Q8H   levothyroxine  175 mcg Oral Daily   nicotine  21 mg Transdermal Daily   polyethylene glycol  17 g Oral Daily   senna  1 tablet Oral QHS   venlafaxine  75 mg Oral BID WC   Continuous Infusions:  cefTRIAXone (ROCEPHIN)  IV Stopped (06/06/22 [id number])     LOS: 2 days    Roxan Hockey M.D on 06/06/2022 at 5:52 PM  Go to [email or web address] - for contact info  [facility] - Office  [phone]  If 7PM-7AM, please contact night-coverage [email or web address] 06/06/2022, 5:52 PM

## 2022-06-06 NOTE — Care Management Important Message (Signed)
Important Message  Patient Details  Name: Tanya Wright MRN: [id number] Date of Birth: March 29, 1966   Medicare Important Message Given:  N/A - LOS <3 / Initial given by admissions     Tommy Medal 06/06/2022, 10:45 AM

## 2022-06-06 NOTE — Progress Notes (Signed)
DBP elevated, other vitals stable. Pt slept through the night with respirations even and unlabored. Pt refused p.m. and a.m. heparin. Pt reported 7/10 pain this a.m, PRN meds given.

## 2022-06-06 NOTE — Consult Note (Signed)
Urology Consult  Referring physician: Dr. Denton Brick Reason for referral: acute renal failure, bilateral ureteral stents in place  Chief Complaint: Bilateral flank and suprapubic pain  History of Present Illness: Tanya Wright is a 57yo who was admitted with UTI, acute renal failure and flank pain. 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 Her creatinine is 2.2-2.5 baseline and 4.5 on presentation.  Her stents are currently exchanged every 6 months at [facility]. She is scheduled for stent exchange on 07/04/2022. On presentation she had bilateral flank pain and suprapubic pain. Here pain has improved since admission. CT on admission showed no right hydronephrosis and stable left hydronephrosis with fat stranding in the renal pelvis. Urine culture currently growing e coli. She is urinating normally.Creatinine has improved to 3.2. She is afebrile.  Past Medical History:  Diagnosis Date   Cancer ([place])    Cervical cancer ([place])    Hypertension    Renal disorder    Thyroid disease    Past Surgical History:  Procedure Laterality Date   IR NEPHROSTOMY PLACEMENT LEFT  09/28/2020   IR NEPHROSTOMY PLACEMENT RIGHT  09/28/2020   STENT PLACEMENT RT URETER ([place] HX)     sent in both kidneys for stones   TEE WITHOUT CARDIOVERSION N/A 09/29/2020   Procedure: TRANSESOPHAGEAL ECHOCARDIOGRAM (TEE) WITH PROPOFOL;  Surgeon: Fay Records, MD;  Location: [facility];  Service: Cardiovascular;  Laterality: N/A;   TUBAL LIGATION      Medications: I have reviewed the patient's current medications. Allergies:  Allergies  Allergen Reactions   Oxycodone Itching    History reviewed. No pertinent family history. Social History:  reports that she has quit smoking. Her smoking use included cigarettes. She has never used smokeless tobacco. She reports current alcohol use. She reports current drug use. Drug: Marijuana.  Review of Systems  Genitourinary:  Positive for flank  pain and pelvic pain.  All other systems reviewed and are negative.   Physical Exam:  Vital signs in last 24 hours: Temp:  [98.3 F (36.8 C)-98.7 F (37.1 C)] 98.4 F (36.9 C) (02/13 1538) Pulse Rate:  [85-91] 88 (02/13 1538) Resp:  [18-20] 20 (02/13 1538) BP: (104-132)/(76-95) 132/88 (02/13 1538) SpO2:  [97 %-100 %] 100 % (02/13 1538) Physical Exam Vitals reviewed.  Constitutional:      Appearance: Normal appearance.  HENT:     Head: Normocephalic and atraumatic.     Nose: Nose normal. No congestion.     Mouth/Throat:     Mouth: Mucous membranes are dry.  Eyes:     Extraocular Movements: Extraocular movements intact.     Pupils: Pupils are equal, round, and reactive to light.  Cardiovascular:     Rate and Rhythm: Normal rate and regular rhythm.  Pulmonary:     Effort: Pulmonary effort is normal. No respiratory distress.  Abdominal:     General: Abdomen is flat. There is no distension.  Musculoskeletal:        General: No swelling. Normal range of motion.     Cervical back: Normal range of motion and neck supple.  Skin:    General: Skin is warm and dry.  Neurological:     General: No focal deficit present.     Mental Status: She is alert and oriented to person, place, and time.  Psychiatric:        Mood and Affect: Mood normal.        Behavior: Behavior normal.  Thought Content: Thought content normal.        Judgment: Judgment normal.     Laboratory Data:  Results for orders placed or performed during the hospital encounter of 06/04/22 (from the past 72 hour(s))  Comprehensive metabolic panel     Status: Abnormal   Collection Time: 06/04/22 11:50 AM  Result Value Ref Range   Sodium 132 (L) 135 - 145 mmol/L   Potassium 4.5 3.5 - 5.1 mmol/L   Chloride 100 98 - 111 mmol/L   CO2 20 (L) 22 - 32 mmol/L   Glucose, Bld 108 (H) 70 - 99 mg/dL    Comment: Glucose reference range applies only to samples taken after fasting for at least 8 hours.   BUN 63 (H) 6 -  20 mg/dL   Creatinine, Ser 4.51 (H) 0.44 - 1.00 mg/dL   Calcium 8.4 (L) 8.9 - 10.3 mg/dL   Total Protein 7.2 6.5 - 8.1 g/dL   Albumin 2.9 (L) 3.5 - 5.0 g/dL   AST 56 (H) 15 - 41 U/L   ALT 61 (H) 0 - 44 U/L   Alkaline Phosphatase 117 38 - 126 U/L   Total Bilirubin 0.8 0.3 - 1.2 mg/dL   GFR, Estimated 11 (L) >60 mL/min    Comment: (NOTE) Calculated using the CKD-EPI Creatinine Equation (2021)    Anion gap 12 5 - 15    Comment: Performed at [facility], [address], [place], [place] [address]  CBC with Differential     Status: None   Collection Time: 06/04/22 11:50 AM  Result Value Ref Range   WBC 5.5 4.0 - 10.5 K/uL   RBC 4.09 3.87 - 5.11 MIL/uL   Hemoglobin 12.2 12.0 - 15.0 g/dL   HCT 37.2 36.0 - 46.0 %   MCV 91.0 80.0 - 100.0 fL   MCH 29.8 26.0 - 34.0 pg   MCHC 32.8 30.0 - 36.0 g/dL   RDW 14.9 11.5 - 15.5 %   Platelets 195 150 - 400 K/uL   nRBC 0.0 0.0 - 0.2 %   Neutrophils Relative % 70 %   Neutro Abs 3.8 1.7 - 7.7 K/uL   Lymphocytes Relative 13 %   Lymphs Abs 0.7 0.7 - 4.0 K/uL   Monocytes Relative 15 %   Monocytes Absolute 0.8 0.1 - 1.0 K/uL   Eosinophils Relative 1 %   Eosinophils Absolute 0.1 0.0 - 0.5 K/uL   Basophils Relative 0 %   Basophils Absolute 0.0 0.0 - 0.1 K/uL   Immature Granulocytes 1 %   Abs Immature Granulocytes 0.05 0.00 - 0.07 K/uL    Comment: Performed at [facility], [address], [place], [place] [address]  Culture, blood (routine x 2)     Status: None (Preliminary result)   Collection Time: 06/04/22 11:55 AM   Specimen: Right Antecubital; Blood  Result Value Ref Range   Specimen Description      RIGHT ANTECUBITAL BOTTLES DRAWN AEROBIC AND ANAEROBIC Performed at [facility], [address] [place], [place] [address]    Special Requests      Blood Culture adequate volume Performed at [facility], [address], [place], [place] [address]    Culture      NO GROWTH 2 DAYS Performed at [facility], [place] [address]  [place], [place] [address]    Report Status PENDING   Lactic acid, plasma     Status: None   Collection Time: 06/04/22 12:19 PM  Result Value Ref Range  Lactic Acid, Venous 0.7 0.5 - 1.9 mmol/L    Comment: Performed at [facility], [address], [place], [place] [address]  Culture, blood (routine x 2)     Status: None (Preliminary result)   Collection Time: 06/04/22 12:19 PM   Specimen: BLOOD LEFT ARM  Result Value Ref Range   Specimen Description      BLOOD LEFT ARM BOTTLES DRAWN AEROBIC AND ANAEROBIC Performed at [facility], [address], [place], [place] [address]    Special Requests      Blood Culture adequate volume Performed at [facility], [address], [place], [place] [address]    Culture      NO GROWTH 2 DAYS Performed at [place] [address], [place], [place] [address]    Report Status PENDING   Lactic acid, plasma     Status: None   Collection Time: 06/04/22  2:23 PM  Result Value Ref Range   Lactic Acid, Venous 0.6 0.5 - 1.9 mmol/L    Comment: Performed at [facility], [address] [place], [place] [address]  CK     Status: None   Collection Time: 06/04/22  2:23 PM  Result Value Ref Range   Total CK 91 38 - 234 U/L    Comment: Performed at [facility], [address] [place], [place] [address]  Resp panel by RT-PCR (RSV, Flu A&B, Covid) Anterior Nasal Swab     Status: None   Collection Time: 06/04/22  2:32 PM   Specimen: Anterior Nasal Swab  Result Value Ref Range   SARS Coronavirus 2 by RT PCR NEGATIVE NEGATIVE    Comment: (NOTE) SARS-CoV-2 target nucleic acids are NOT DETECTED.  The SARS-CoV-2 RNA is generally detectable in upper respiratory specimens during the acute phase of infection. The lowest concentration of SARS-CoV-2 viral copies this assay can detect is 138 copies/mL. A negative result does not preclude SARS-Cov-2 infection and should not be used as the sole basis for treatment or other patient management decisions. A  negative result may occur with  improper specimen collection/handling, submission of specimen other than nasopharyngeal swab, presence of viral mutation(s) within the areas targeted by this assay, and inadequate number of viral copies(<138 copies/mL). A negative result must be combined with clinical observations, patient history, and epidemiological information. The expected result is Negative.  Fact Sheet for Patients:  EntrepreneurPulse.com.au  Fact Sheet for Healthcare Providers:  IncredibleEmployment.be  This test is no t yet approved or cleared by the [place] FDA and  has been authorized for detection and/or diagnosis of SARS-CoV-2 by FDA under an Emergency Use Authorization (EUA). This EUA will remain  in effect (meaning this test can be used) for the duration of the COVID-19 declaration under Section 564(b)(1) of the Act, 21 U.S.C.section 360bbb-3(b)(1), unless the authorization is terminated  or revoked sooner.       Influenza A by PCR NEGATIVE NEGATIVE   Influenza B by PCR NEGATIVE NEGATIVE    Comment: (NOTE) The Xpert Xpress SARS-CoV-2/FLU/RSV plus assay is intended as an aid in the diagnosis of influenza from Nasopharyngeal swab specimens and should not be used as a sole basis for treatment. Nasal washings and aspirates are unacceptable for Xpert Xpress SARS-CoV-2/FLU/RSV testing.  Fact Sheet for Patients: EntrepreneurPulse.com.au  Fact Sheet for Healthcare Providers: IncredibleEmployment.be  This test is not yet approved or cleared by the [place] FDA and has been authorized for detection and/or diagnosis of SARS-CoV-2 by FDA under an Emergency Use  Authorization (EUA). This EUA will remain in effect (meaning this test can be used) for the duration of the COVID-19 declaration under Section 564(b)(1) of the Act, 21 U.S.C. section 360bbb-3(b)(1), unless the authorization is terminated  or revoked.     Resp Syncytial Virus by PCR NEGATIVE NEGATIVE    Comment: (NOTE) Fact Sheet for Patients: EntrepreneurPulse.com.au  Fact Sheet for Healthcare Providers: IncredibleEmployment.be  This test is not yet approved or cleared by the [place] FDA and has been authorized for detection and/or diagnosis of SARS-CoV-2 by FDA under an Emergency Use Authorization (EUA). This EUA will remain in effect (meaning this test can be used) for the duration of the COVID-19 declaration under Section 564(b)(1) of the Act, 21 U.S.C. section 360bbb-3(b)(1), unless the authorization is terminated or revoked.  Performed at [facility], [address] [place], [place] [address]   Urinalysis, w/ Reflex to Culture (Infection Suspected) -Urine, Unspecified Source     Status: Abnormal   Collection Time: 06/04/22  2:47 PM  Result Value Ref Range   Specimen Source URINE, UNSPE    Color, Urine AMBER (A) YELLOW    Comment: BIOCHEMICALS MAY BE AFFECTED BY COLOR   APPearance CLOUDY (A) CLEAR   Specific Gravity, Urine 1.009 1.005 - 1.030   pH 6.0 5.0 - 8.0   Glucose, UA NEGATIVE NEGATIVE mg/dL   Hgb urine dipstick MODERATE (A) NEGATIVE   Bilirubin Urine NEGATIVE NEGATIVE   Ketones, ur NEGATIVE NEGATIVE mg/dL   Protein, ur 100 (A) NEGATIVE mg/dL   Nitrite POSITIVE (A) NEGATIVE   Leukocytes,Ua MODERATE (A) NEGATIVE   RBC / HPF >50 0 - 5 RBC/hpf   WBC, UA >50 0 - 5 WBC/hpf    Comment:        Reflex urine culture not performed if WBC <=10, OR if Squamous epithelial cells >5. If Squamous epithelial cells >5 suggest recollection.    Bacteria, UA MANY (A) NONE SEEN   Squamous Epithelial / HPF 0-5 0 - 5 /HPF   WBC Clumps PRESENT    Mucus PRESENT     Comment: Performed at [facility], [address] [place], [place] [address]  Urine Culture     Status: Abnormal (Preliminary result)   Collection Time: 06/04/22  2:47 PM   Specimen: Urine, Random  Result  Value Ref Range   Specimen Description      URINE, RANDOM Performed at [facility], [address], [place], [place] [address]    Special Requests      NONE Reflexed from [id number] Performed at [facility], [address] [place], [place] [address]    Culture (A)     >=100,000 COLONIES/mL ESCHERICHIA COLI CULTURE REINCUBATED FOR BETTER GROWTH SUSCEPTIBILITIES TO FOLLOW Performed at [facility], [place] [address], [place], [place] [address]    Report Status PENDING   HIV Antibody (routine testing w rflx)     Status: None   Collection Time: 06/04/22  4:58 PM  Result Value Ref Range   HIV Screen 4th Generation wRfx Non Reactive Non Reactive    Comment: Performed at [facility], [place] [address] [place], [place] [address]  Comprehensive metabolic panel     Status: Abnormal   Collection Time: 06/05/22  5:25 AM  Result Value Ref Range   Sodium 134 (L) 135 - 145 mmol/L   Potassium 4.4 3.5 - 5.1 mmol/L   Chloride 103 98 - 111 mmol/L   CO2 21 (L) 22 - 32 mmol/L  Glucose, Bld 107 (H) 70 - 99 mg/dL    Comment: Glucose reference range applies only to samples taken after fasting for at least 8 hours.   BUN 61 (H) 6 - 20 mg/dL   Creatinine, Ser 3.76 (H) 0.44 - 1.00 mg/dL   Calcium 8.0 (L) 8.9 - 10.3 mg/dL   Total Protein 6.4 (L) 6.5 - 8.1 g/dL   Albumin 2.5 (L) 3.5 - 5.0 g/dL   AST 32 15 - 41 U/L   ALT 44 0 - 44 U/L   Alkaline Phosphatase 99 38 - 126 U/L   Total Bilirubin 0.5 0.3 - 1.2 mg/dL   GFR, Estimated 13 (L) >60 mL/min    Comment: (NOTE) Calculated using the CKD-EPI Creatinine Equation (2021)    Anion gap 10 5 - 15    Comment: Performed at [facility], [address] [place], [place] [address]  Magnesium     Status: None   Collection Time: 06/05/22  5:25 AM  Result Value Ref Range   Magnesium 2.0 1.7 - 2.4 mg/dL    Comment: Performed at [facility], [address] [place], [place] [address]  CBC with Differential/Platelet     Status: Abnormal   Collection Time:  06/05/22  5:25 AM  Result Value Ref Range   WBC 5.5 4.0 - 10.5 K/uL   RBC 3.54 (L) 3.87 - 5.11 MIL/uL   Hemoglobin 10.5 (L) 12.0 - 15.0 g/dL   HCT 32.7 (L) 36.0 - 46.0 %   MCV 92.4 80.0 - 100.0 fL   MCH 29.7 26.0 - 34.0 pg   MCHC 32.1 30.0 - 36.0 g/dL   RDW 14.8 11.5 - 15.5 %   Platelets 211 150 - 400 K/uL   nRBC 0.0 0.0 - 0.2 %   Neutrophils Relative % 58 %   Neutro Abs 3.2 1.7 - 7.7 K/uL   Lymphocytes Relative 23 %   Lymphs Abs 1.2 0.7 - 4.0 K/uL   Monocytes Relative 15 %   Monocytes Absolute 0.8 0.1 - 1.0 K/uL   Eosinophils Relative 2 %   Eosinophils Absolute 0.1 0.0 - 0.5 K/uL   Basophils Relative 0 %   Basophils Absolute 0.0 0.0 - 0.1 K/uL   Immature Granulocytes 2 %   Abs Immature Granulocytes 0.08 (H) 0.00 - 0.07 K/uL    Comment: Performed at [facility], [address], [place], [place] [id number]  Basic metabolic panel     Status: Abnormal   Collection Time: 06/06/22 10:25 AM  Result Value Ref Range   Sodium 135 135 - 145 mmol/L   Potassium 4.9 3.5 - 5.1 mmol/L   Chloride 101 98 - 111 mmol/L   CO2 22 22 - 32 mmol/L   Glucose, Bld 98 70 - 99 mg/dL    Comment: Glucose reference range applies only to samples taken after fasting for at least 8 hours.   BUN 51 (H) 6 - 20 mg/dL   Creatinine, Ser 3.23 (H) 0.44 - 1.00 mg/dL   Calcium 8.6 (L) 8.9 - 10.3 mg/dL   GFR, Estimated 16 (L) >60 mL/min    Comment: (NOTE) Calculated using the CKD-EPI Creatinine Equation (2021)    Anion gap 12 5 - 15    Comment: Performed at [facility], [address], [place],  [address]   Recent Results (from the past 240 hour(s))  Culture, blood (routine x 2)     Status: None (Preliminary result)   Collection Time: 06/04/22 11:55 AM   Specimen: Right Antecubital;  Blood  Result Value Ref Range Status   Specimen Description   Final    RIGHT ANTECUBITAL BOTTLES DRAWN AEROBIC AND ANAEROBIC Performed at [facility], [address], [place], [place] [address]    Special Requests   Final     Blood Culture adequate volume Performed at [facility], [address], [place], [place] [address]    Culture   Final    NO GROWTH 2 DAYS Performed at [facility], [place] [address], [place], [place] [address]    Report Status PENDING  Incomplete  Culture, blood (routine x 2)     Status: None (Preliminary result)   Collection Time: 06/04/22 12:19 PM   Specimen: BLOOD LEFT ARM  Result Value Ref Range Status   Specimen Description   Final    BLOOD LEFT ARM BOTTLES DRAWN AEROBIC AND ANAEROBIC Performed at [facility], [address] [place], [place] [address]    Special Requests   Final    Blood Culture adequate volume Performed at [facility], [address] [place], [place] [address]    Culture   Final    NO GROWTH 2 DAYS Performed at [facility], [place] [address] [place], [place] [address]    Report Status PENDING  Incomplete  Resp panel by RT-PCR (RSV, Flu A&B, Covid) Anterior Nasal Swab     Status: None   Collection Time: 06/04/22  2:32 PM   Specimen: Anterior Nasal Swab  Result Value Ref Range Status   SARS Coronavirus 2 by RT PCR NEGATIVE NEGATIVE Final    Comment: (NOTE) SARS-CoV-2 target nucleic acids are NOT DETECTED.  The SARS-CoV-2 RNA is generally detectable in upper respiratory specimens during the acute phase of infection. The lowest concentration of SARS-CoV-2 viral copies this assay can detect is 138 copies/mL. A negative result does not preclude SARS-Cov-2 infection and should not be used as the sole basis for treatment or other patient management decisions. A negative result may occur with  improper specimen collection/handling, submission of specimen other than nasopharyngeal swab, presence of viral mutation(s) within the areas targeted by this assay, and inadequate number of viral copies(<138 copies/mL). A negative result must be combined with clinical observations, patient history, and epidemiological information. The expected result is  Negative.  Fact Sheet for Patients:  EntrepreneurPulse.com.au  Fact Sheet for Healthcare Providers:  IncredibleEmployment.be  This test is no t yet approved or cleared by the [place] FDA and  has been authorized for detection and/or diagnosis of SARS-CoV-2 by FDA under an Emergency Use Authorization (EUA). This EUA will remain  in effect (meaning this test can be used) for the duration of the COVID-19 declaration under Section 564(b)(1) of the Act, 21 U.S.C.section 360bbb-3(b)(1), unless the authorization is terminated  or revoked sooner.       Influenza A by PCR NEGATIVE NEGATIVE Final   Influenza B by PCR NEGATIVE NEGATIVE Final    Comment: (NOTE) The Xpert Xpress SARS-CoV-2/FLU/RSV plus assay is intended as an aid in the diagnosis of influenza from Nasopharyngeal swab specimens and should not be used as a sole basis for treatment. Nasal washings and aspirates are unacceptable for Xpert Xpress SARS-CoV-2/FLU/RSV testing.  Fact Sheet for Patients: EntrepreneurPulse.com.au  Fact Sheet for Healthcare Providers: IncredibleEmployment.be  This test is not yet approved or cleared by the [place] FDA and has been authorized for detection and/or diagnosis of SARS-CoV-2 by FDA under an Emergency Use Authorization (EUA). This EUA will remain in effect (meaning this  test can be used) for the duration of the COVID-19 declaration under Section 564(b)(1) of the Act, 21 U.S.C. section 360bbb-3(b)(1), unless the authorization is terminated or revoked.     Resp Syncytial Virus by PCR NEGATIVE NEGATIVE Final    Comment: (NOTE) Fact Sheet for Patients: EntrepreneurPulse.com.au  Fact Sheet for Healthcare Providers: IncredibleEmployment.be  This test is not yet approved or cleared by the [place] FDA and has been authorized for detection and/or diagnosis of  SARS-CoV-2 by FDA under an Emergency Use Authorization (EUA). This EUA will remain in effect (meaning this test can be used) for the duration of the COVID-19 declaration under Section 564(b)(1) of the Act, 21 U.S.C. section 360bbb-3(b)(1), unless the authorization is terminated or revoked.  Performed at [facility], [address] [place], [place] [address]   Urine Culture     Status: Abnormal (Preliminary result)   Collection Time: 06/04/22  2:47 PM   Specimen: Urine, Random  Result Value Ref Range Status   Specimen Description   Final    URINE, RANDOM Performed at [facility], [address], [place], [place] [address]    Special Requests   Final    NONE Reflexed from [id number] Performed at [facility], [address], [place], [place] [address]    Culture (A)  Final    >=100,000 COLONIES/mL ESCHERICHIA COLI CULTURE REINCUBATED FOR BETTER GROWTH SUSCEPTIBILITIES TO FOLLOW Performed at [place] [address], [place], [place] [address]    Report Status PENDING  Incomplete   Creatinine: Recent Labs    06/04/22 1150 06/05/22 0525 06/06/22 1025  CREATININE 4.51* 3.76* 3.23*   Baseline Creatinine: 2.2-2.5  Impression/Assessment:  56yo with bilateral ureteral obstruction managed with ureteral stents, pyelonephrosis  Plan:  Bilateral ureteral obstruction: CT scan demonstrated no right hydronephrosis and stable left hydronephrosis. Since here creatinine has improved with hydration and her pain has improved with treating UTI/pyelonephrosis she does not require stent exchange at this time. I instructed the patient to call her Urologist at [facility] to obtain an earlier appointment for stent exchange. At [facility] we do not stock 73F stents so the procedure cannot be done at this hospital Pyelonephritis: Please continue broad spectrum antibiotics pending her urine culture. She will need 2 weeks of culture specific antibitoics.   Nicolette Bang 06/06/2022, 3:56 PM

## 2022-06-07 DIAGNOSIS — A4101 Sepsis due to Methicillin susceptible Staphylococcus aureus: Secondary | ICD-10-CM | POA: Diagnosis not present

## 2022-06-07 DIAGNOSIS — N179 Acute kidney failure, unspecified: Secondary | ICD-10-CM | POA: Diagnosis not present

## 2022-06-07 DIAGNOSIS — F339 Major depressive disorder, recurrent, unspecified: Secondary | ICD-10-CM | POA: Diagnosis not present

## 2022-06-07 DIAGNOSIS — N12 Tubulo-interstitial nephritis, not specified as acute or chronic: Secondary | ICD-10-CM | POA: Diagnosis not present

## 2022-06-07 LAB — BASIC METABOLIC PANEL
Anion gap: 8 (ref 5–15)
BUN: 44 mg/dL — ABNORMAL HIGH (ref 6–20)
CO2: 23 mmol/L (ref 22–32)
Calcium: 8.3 mg/dL — ABNORMAL LOW (ref 8.9–10.3)
Chloride: 103 mmol/L (ref 98–111)
Creatinine, Ser: 2.89 mg/dL — ABNORMAL HIGH (ref 0.44–1.00)
GFR, Estimated: 18 mL/min — ABNORMAL LOW (ref >60)
Glucose, Bld: 116 mg/dL — ABNORMAL HIGH (ref 70–99)
Potassium: 5.2 mmol/L — ABNORMAL HIGH (ref 3.5–5.1)
Sodium: 134 mmol/L — ABNORMAL LOW (ref 135–145)

## 2022-06-07 LAB — URINE CULTURE: Culture: >=100000 — AB

## 2022-06-07 MED ORDER — SODIUM CHLORIDE 0.9 % IV SOLN: INTRAVENOUS | Status: DC

## 2022-06-07 MED ORDER — CEFDINIR 300 MG PO CAPS: 300.0000 mg | ORAL_CAPSULE | Freq: Every day | ORAL | 0 refills | Status: AC

## 2022-06-07 MED ORDER — SODIUM ZIRCONIUM CYCLOSILICATE 10 G PO PACK
10.0000 g | PACK | Freq: Three times a day (TID) | ORAL | Status: DC
  Administered 2022-06-07: 10 g via ORAL
  Filled 2022-06-07: qty 1

## 2022-06-07 NOTE — Discharge Instructions (Signed)
IMPORTANT INFORMATION: PAY CLOSE ATTENTION   PHYSICIAN DISCHARGE INSTRUCTIONS  Follow with Primary care provider  Patient, No Pcp Per  and other consultants as instructed by your Hospitalist Physician  SEEK MEDICAL CARE OR RETURN TO EMERGENCY ROOM IF SYMPTOMS COME BACK, WORSEN OR NEW PROBLEM DEVELOPS   Please note: You were cared for by a hospitalist during your hospital stay. Every effort will be made to forward records to your primary care provider.  You can request that your primary care provider send for your hospital records if they have not received them.  Once you are discharged, your primary care physician will handle any further medical issues. Please note that NO REFILLS for any discharge medications will be authorized once you are discharged, as it is imperative that you return to your primary care physician (or establish a relationship with a primary care physician if you do not have one) for your post hospital discharge needs so that they can reassess your need for medications and monitor your lab values.  Please get a complete blood count and chemistry panel checked by your Primary MD at your next visit, and again as instructed by your Primary MD.  Get Medicines reviewed and adjusted: Please take all your medications with you for your next visit with your Primary MD  Laboratory/radiological data: Please request your Primary MD to go over all hospital tests and procedure/radiological results at the follow up, please ask your primary care provider to get all Hospital records sent to his/her office.  In some cases, they will be blood work, cultures and biopsy results pending at the time of your discharge. Please request that your primary care provider follow up on these results.  If you are diabetic, please bring your blood sugar readings with you to your follow up appointment with primary care.    Please call and make your follow up appointments as soon as possible.    Also Note  the following: If you experience worsening of your admission symptoms, develop shortness of breath, life threatening emergency, suicidal or homicidal thoughts you must seek medical attention immediately by calling 911 or calling your MD immediately  if symptoms less severe.  You must read complete instructions/literature along with all the possible adverse reactions/side effects for all the Medicines you take and that have been prescribed to you. Take any new Medicines after you have completely understood and accpet all the possible adverse reactions/side effects.   Do not drive when taking Pain medications or sleeping medications (Benzodiazepines)  Do not take more than prescribed Pain, Sleep and Anxiety Medications. It is not advisable to combine anxiety,sleep and pain medications without talking with your primary care practitioner  Special Instructions: If you have smoked or chewed Tobacco  in the last 2 yrs please stop smoking, stop any regular Alcohol  and or any Recreational drug use.  Wear Seat belts while driving.  Do not drive if taking any narcotic, mind altering or controlled substances or recreational drugs or alcohol.        

## 2022-06-07 NOTE — Discharge Summary (Signed)
Physician Discharge Summary  Tanya Wright [id number] DOB: [birth year] DOA: 06/04/2022 Urology: [facility]  Admit date: 06/04/2022 Discharge date: 06/07/2022  Admitted From:  Home  Disposition: Home   Recommendations for Outpatient Follow-up:  Follow up with PCP in 1 weeks Follow up with [facility] in 2 weeks for stent exchange   Discharge Condition: STABLE   CODE STATUS: FULL DIET: resume prior home diet    Brief Hospitalization Summary: Please see all hospital notes, images, labs for full details of the hospitalization. ADMISSION HPI:   57 y.o. female with medical history significant of cancer, hypertension, CKD stage IV, thyroid disease, depression, and more presents the ED with a chief complaint of myalgias and diarrhea.  Patient reports that myalgias and diarrhea started at the same time 3 days ago.  The diarrhea occurs all day she cannot count how many times.  She describes it as nonbloody.  She denies abdominal pain.  She denies nausea and vomiting.  She reports that she has had a decreased appetite for about 3 days.  She has had dysuria for 2-3 days.  No hematuria.  Patient has felt feverish but has not measured a temperature.  Patient reports she has had chest pain and dyspnea that are nonexertional.  This started at the same time.  Patient denies cough.  Patient reports she used to be on Advair and it did help, but she has not been on it recently.  Patient is tearful during exam and reports that she is tired of being sick all the time.  She reports that she is irritable and is short with myself and the nurse.  She reports the Prozac does help with her mood.  Patient reports no other complaints at this time.   Patient does smoke and request nicotine patch.  She drinks once per week.  She uses marijuana and the last use was 2 weeks ago.  She is vaccinated for COVID and flu.  Patient is full code.  HOSPITAL COURSE BY PROBLEM   AKI on CKD IV in the setting of obstructive uropathy and  left-sided hydronephrosis - Acute injury on chronic renal disease -Baseline Creatinine-- 2.0 to 2.2 - Creatinine on admission 4.51 -Creatinine currently trending down, now at 2.89 - CT renal stone study shows bilateral double-J nephroureteral stents with persistent moderate left hydronephrosis.  Prominent urothelial thickening of the left proximal ureter and left renal pelvis with adjacent fat stranding similar to previous -treated with IV fluids -Urology consult noted - recommended outpatient follow up with [facility] -Patient usually sees urologist at [facility]---she gets her ureteral stent changed every 6 months -Renally adjust medications, avoid nephrotoxic agents / dehydration  / hypotension   2) sepsis due to complicated E coli UTI/PyeloNephritis-- in the setting of obstructive uropathy and left-sided hydronephrosis Please see #1 above -Please see imaging studies as above #1 -treated with IV Rocephin, DC home on oral cefdinir 300 mg daily for 10 more days to complete full 14 day course of therapy  -As per Urologist dc home on antibiotics when culture sensitivity is available... follow up at [facility] for stent exchange within next couple of weeks  - 3)Major depression, recurrent, chronic -resumed home treatments    4)Hypothyroidism -c/n Levothyroxine   5)Tobacco use disorder --Continue nicotine patch   Discharge Diagnoses:  Principal Problem:   AKI (acute kidney injury) ([place]) Active Problems:   UTI (urinary tract infection)   Sepsis ([place])   Hypothyroidism   Major depression, recurrent, chronic ([place])   Tobacco  use disorder   CKD (chronic kidney disease), stage IV ([place])   Other hydronephrosis   Discharge Instructions:  Allergies as of 06/07/2022       Reactions   Oxycodone Itching        Medication List     STOP taking these medications    FLUoxetine 10 MG capsule Commonly known as: PROZAC   losartan 25 MG tablet Commonly known as: COZAAR   senna 8.6 MG  tablet Commonly known as: SENOKOT       TAKE these medications    acetaminophen 325 MG tablet Commonly known as: TYLENOL Take 2 tablets (650 mg total) by mouth every 6 (six) hours as needed for mild pain (or Fever >/= 101).   calcium acetate 667 MG capsule Commonly known as: PHOSLO Take 2 capsules (1,334 mg total) by mouth 3 (three) times daily with meals.   cefdinir 300 MG capsule Commonly known as: OMNICEF Take 1 capsule (300 mg total) by mouth daily for 10 days. Start taking on: June 08, 2022   halobetasol 0.05 % ointment Commonly known as: ULTRAVATE Apply topically.   levothyroxine 175 MCG tablet Commonly known as: SYNTHROID Take 1 tablet (175 mcg total) by mouth daily.   lidocaine-prilocaine cream Commonly known as: EMLA Apply topically.   oxyCODONE 5 MG immediate release tablet Commonly known as: Oxy IR/ROXICODONE Take 5 mg by mouth every 6 (six) hours as needed for moderate pain.   oxyCODONE ER 9 MG C12a Take 9 mg by mouth every 12 (twelve) hours.   venlafaxine XR 75 MG 24 hr capsule Commonly known as: EFFEXOR-XR Take 75 mg by mouth daily with breakfast.        Follow-up Information     Primary care provider Follow up in 1 week(s).   Why: Hospital Follow Up        [place]. Schedule an appointment as soon as possible for a visit in 2 week(s).   Why: Hospital Follow Up               Allergies  Allergen Reactions   Oxycodone Itching   Allergies as of 06/07/2022       Reactions   Oxycodone Itching        Medication List     STOP taking these medications    FLUoxetine 10 MG capsule Commonly known as: PROZAC   losartan 25 MG tablet Commonly known as: COZAAR   senna 8.6 MG tablet Commonly known as: SENOKOT       TAKE these medications    acetaminophen 325 MG tablet Commonly known as: TYLENOL Take 2 tablets (650 mg total) by mouth every 6 (six) hours as needed for mild pain (or Fever >/= 101).   calcium acetate  667 MG capsule Commonly known as: PHOSLO Take 2 capsules (1,334 mg total) by mouth 3 (three) times daily with meals.   cefdinir 300 MG capsule Commonly known as: OMNICEF Take 1 capsule (300 mg total) by mouth daily for 10 days. Start taking on: June 08, 2022   halobetasol 0.05 % ointment Commonly known as: ULTRAVATE Apply topically.   levothyroxine 175 MCG tablet Commonly known as: SYNTHROID Take 1 tablet (175 mcg total) by mouth daily.   lidocaine-prilocaine cream Commonly known as: EMLA Apply topically.   oxyCODONE 5 MG immediate release tablet Commonly known as: Oxy IR/ROXICODONE Take 5 mg by mouth every 6 (six) hours as needed for moderate pain.   oxyCODONE ER 9 MG C12a Take 9 mg by mouth  every 12 (twelve) hours.   venlafaxine XR 75 MG 24 hr capsule Commonly known as: EFFEXOR-XR Take 75 mg by mouth daily with breakfast.        Procedures/Studies: CT Renal Stone Study  Result Date: 06/04/2022 CLINICAL DATA:  Flank pain.  Cervical cancer.  On chemotherapy EXAM: CT ABDOMEN AND PELVIS WITHOUT CONTRAST TECHNIQUE: Multidetector CT imaging of the abdomen and pelvis was performed following the standard protocol without IV contrast. RADIATION DOSE REDUCTION: This exam was performed according to the departmental dose-optimization program which includes automated exposure control, adjustment of the mA and/or kV according to patient size and/or use of iterative reconstruction technique. COMPARISON:  09/26/2020 FINDINGS: Lower chest: Mild bibasilar atelectasis.  Normal heart size. Hepatobiliary: Unremarkable unenhanced appearance of the liver. No focal liver lesion identified. Gallbladder within normal limits. No hyperdense gallstone. No biliary dilatation. Pancreas: Unremarkable. No pancreatic ductal dilatation or surrounding inflammatory changes. Spleen: Normal in size without focal abnormality. Adrenals/Urinary Tract: Unremarkable adrenal glands. Bilateral double-J nephroureteral  stents. The proximal pigtails of both stents are positioned within upper pole calices. Distal pigtails are well positioned within the urinary bladder lumen. No right-sided hydronephrosis. Moderate left hydronephrosis. Prominent urothelial thickening of the left proximal ureter and left renal pelvis with adjacent fat stranding, which is similar in appearance to the previous study. No renal or ureteral stone. No urinary bladder wall thickening. Stomach/Bowel: Stomach is within normal limits. Appendix appears normal (series 5, image 58). No evidence of bowel wall thickening, distention, or inflammatory changes. Vascular/Lymphatic: Aortoiliac atherosclerosis without aneurysm. Redemonstration of numerous enlarged, calcified retroperitoneal, bilateral iliac, and bilateral pelvic sidewall lymph nodes. No evidence of progressive lymphadenopathy within the abdomen or pelvis. Reproductive: Uterus and bilateral adnexa are unremarkable. No fluid is seen within the endometrial canal on today's study. Other: Trace free fluid within the pelvis. No organized fluid collections. No pneumoperitoneum. No abdominal wall hernia. Musculoskeletal: No new or acute osseous abnormality. No suspicious lytic or sclerotic bone lesion is identified. IMPRESSION: 1. Bilateral double-J nephroureteral stents with positioning as described above. Persistent moderate left hydronephrosis. 2. Prominent urothelial thickening of the left proximal ureter and left renal pelvis with adjacent fat stranding, which is similar in appearance to the previous study and could be secondary to an infectious or inflammatory process. Correlate with urinalysis. 3. Redemonstration of numerous enlarged, calcified retroperitoneal, bilateral iliac, and bilateral pelvic sidewall lymph nodes consistent with treated nodal metastatic disease. 4. Aortic atherosclerosis (ICD10-I70.0). Electronically Signed   By: Davina Poke D.O.   On: 06/04/2022 14:32   DG Chest Portable 1  View  Result Date: 06/04/2022 CLINICAL DATA:  Weakness. Diarrhea and dizziness. Recent chemotherapy for cervical carcinoma. EXAM: PORTABLE CHEST 1 VIEW COMPARISON:  09/23/2020. FINDINGS: Cardiac silhouette is normal in size. No mediastinal or hilar masses. Clear lungs.  No pleural effusion or pneumothorax. Stable right anterior chest wall Port-A-Cath. Skeletal structures are grossly intact. IMPRESSION: No active disease. Electronically Signed   By: Lajean Manes M.D.   On: 06/04/2022 14:28     Subjective: Pt reports feeling well today.    Discharge Exam: Vitals:   06/06/22 1538 06/07/22 0352  BP: 132/88 121/83  Pulse: 88 86  Resp: 20 14  Temp: 98.4 F (36.9 C) 98.6 F (37 C)  SpO2: 100% 98%   Vitals:   06/05/22 2217 06/06/22 0442 06/06/22 1538 06/07/22 0352  BP: (!) 118/95 105/76 132/88 121/83  Pulse: 89 91 88 86  Resp: 18 20 20 14  $ Temp: 98.3 F (36.8 C) 98.7  F (37.1 C) 98.4 F (36.9 C) 98.6 F (37 C)  TempSrc:   Oral Oral  SpO2: 100% 98% 100% 98%  Weight:      Height:       General: Pt is alert, awake, not in acute distress Cardiovascular: normal S1/S2 +, no rubs, no gallops Respiratory: CTA bilaterally, no wheezing, no rhonchi Abdominal: Soft, NT, ND, bowel sounds + Extremities: no edema, no cyanosis   The results of significant diagnostics from this hospitalization (including imaging, microbiology, ancillary and laboratory) are listed below for reference.     Microbiology: Recent Results (from the past 240 hour(s))  Culture, blood (routine x 2)     Status: None (Preliminary result)   Collection Time: 06/04/22 11:55 AM   Specimen: Right Antecubital; Blood  Result Value Ref Range Status   Specimen Description   Final    RIGHT ANTECUBITAL BOTTLES DRAWN AEROBIC AND ANAEROBIC   Special Requests Blood Culture adequate volume  Final   Culture   Final    NO GROWTH 3 DAYS Performed at [facility], [address] [place], [place] [address]    Report Status PENDING   Incomplete  Culture, blood (routine x 2)     Status: None (Preliminary result)   Collection Time: 06/04/22 12:19 PM   Specimen: BLOOD LEFT ARM  Result Value Ref Range Status   Specimen Description BLOOD LEFT ARM BOTTLES DRAWN AEROBIC AND ANAEROBIC  Final   Special Requests Blood Culture adequate volume  Final   Culture   Final    NO GROWTH 3 DAYS Performed at [facility], [address], [place], [place] [address]    Report Status PENDING  Incomplete  Resp panel by RT-PCR (RSV, Flu A&B, Covid) Anterior Nasal Swab     Status: None   Collection Time: 06/04/22  2:32 PM   Specimen: Anterior Nasal Swab  Result Value Ref Range Status   SARS Coronavirus 2 by RT PCR NEGATIVE NEGATIVE Final    Comment: (NOTE) SARS-CoV-2 target nucleic acids are NOT DETECTED.  The SARS-CoV-2 RNA is generally detectable in upper respiratory specimens during the acute phase of infection. The lowest concentration of SARS-CoV-2 viral copies this assay can detect is 138 copies/mL. A negative result does not preclude SARS-Cov-2 infection and should not be used as the sole basis for treatment or other patient management decisions. A negative result may occur with  improper specimen collection/handling, submission of specimen other than nasopharyngeal swab, presence of viral mutation(s) within the areas targeted by this assay, and inadequate number of viral copies(<138 copies/mL). A negative result must be combined with clinical observations, patient history, and epidemiological information. The expected result is Negative.  Fact Sheet for Patients:  EntrepreneurPulse.com.au  Fact Sheet for Healthcare Providers:  IncredibleEmployment.be  This test is no t yet approved or cleared by the [place] FDA and  has been authorized for detection and/or diagnosis of SARS-CoV-2 by FDA under an Emergency Use Authorization (EUA). This EUA will remain  in effect (meaning this test  can be used) for the duration of the COVID-19 declaration under Section 564(b)(1) of the Act, 21 U.S.C.section 360bbb-3(b)(1), unless the authorization is terminated  or revoked sooner.       Influenza A by PCR NEGATIVE NEGATIVE Final   Influenza B by PCR NEGATIVE NEGATIVE Final    Comment: (NOTE) The Xpert Xpress SARS-CoV-2/FLU/RSV plus assay is intended as an aid in the diagnosis of influenza from Nasopharyngeal swab specimens and should not be used as a  sole basis for treatment. Nasal washings and aspirates are unacceptable for Xpert Xpress SARS-CoV-2/FLU/RSV testing.  Fact Sheet for Patients: EntrepreneurPulse.com.au  Fact Sheet for Healthcare Providers: IncredibleEmployment.be  This test is not yet approved or cleared by the [place] FDA and has been authorized for detection and/or diagnosis of SARS-CoV-2 by FDA under an Emergency Use Authorization (EUA). This EUA will remain in effect (meaning this test can be used) for the duration of the COVID-19 declaration under Section 564(b)(1) of the Act, 21 U.S.C. section 360bbb-3(b)(1), unless the authorization is terminated or revoked.     Resp Syncytial Virus by PCR NEGATIVE NEGATIVE Final    Comment: (NOTE) Fact Sheet for Patients: EntrepreneurPulse.com.au  Fact Sheet for Healthcare Providers: IncredibleEmployment.be  This test is not yet approved or cleared by the [place] FDA and has been authorized for detection and/or diagnosis of SARS-CoV-2 by FDA under an Emergency Use Authorization (EUA). This EUA will remain in effect (meaning this test can be used) for the duration of the COVID-19 declaration under Section 564(b)(1) of the Act, 21 U.S.C. section 360bbb-3(b)(1), unless the authorization is terminated or revoked.  Performed at [facility], [address], [place], [place] [address]   Urine Culture     Status: Abnormal   Collection  Time: 06/04/22  2:47 PM   Specimen: Urine, Random  Result Value Ref Range Status   Specimen Description   Final    URINE, RANDOM Performed at [facility], [address], [place], [place] [address]    Special Requests   Final    NONE Reflexed from [id number] Performed at [facility], [address], [place], [place] [address]    Culture >=100,000 COLONIES/mL ESCHERICHIA COLI (A)  Final   Report Status 06/07/2022 FINAL  Final   Organism ID, Bacteria ESCHERICHIA COLI (A)  Final      Susceptibility   Escherichia coli - MIC*    AMPICILLIN <=2 SENSITIVE Sensitive     CEFAZOLIN <=4 SENSITIVE Sensitive     CEFEPIME <=0.12 SENSITIVE Sensitive     CEFTRIAXONE <=0.25 SENSITIVE Sensitive     CIPROFLOXACIN <=0.25 SENSITIVE Sensitive     GENTAMICIN <=1 SENSITIVE Sensitive     IMIPENEM <=0.25 SENSITIVE Sensitive     NITROFURANTOIN <=16 SENSITIVE Sensitive     TRIMETH/SULFA <=20 SENSITIVE Sensitive     AMPICILLIN/SULBACTAM <=2 SENSITIVE Sensitive     PIP/TAZO <=4 SENSITIVE Sensitive     * >=100,000 COLONIES/mL ESCHERICHIA COLI     Labs: BNP (last 3 results) No results for input(s): "BNP" in the last 8760 hours. Basic Metabolic Panel: Recent Labs  Lab 06/04/22 1150 06/05/22 0525 06/06/22 1025 06/07/22 0458  NA 132* 134* 135 134*  K 4.5 4.4 4.9 5.2*  CL 100 103 101 103  CO2 20* 21* 22 23  GLUCOSE 108* 107* 98 116*  BUN 63* 61* 51* 44*  CREATININE 4.51* 3.76* 3.23* 2.89*  CALCIUM 8.4* 8.0* 8.6* 8.3*  MG  --  2.0  --   --    Liver Function Tests: Recent Labs  Lab 06/04/22 1150 06/05/22 0525  AST 56* 32  ALT 61* 44  ALKPHOS 117 99  BILITOT 0.8 0.5  PROT 7.2 6.4*  ALBUMIN 2.9* 2.5*   No results for input(s): "LIPASE", "AMYLASE" in the last 168 hours. No results for input(s): "AMMONIA" in the last 168 hours. CBC: Recent Labs  Lab 06/04/22 1150 06/05/22 0525  WBC 5.5 5.5  NEUTROABS 3.8 3.2  HGB 12.2 10.5*  HCT 37.2 32.7*  MCV 91.0 92.4  PLT 195 211   Cardiac  Enzymes: Recent Labs  Lab 06/04/22 1423  CKTOTAL 91   BNP: Invalid input(s): "POCBNP" CBG: No results for input(s): "GLUCAP" in the last 168 hours. D-Dimer No results for input(s): "DDIMER" in the last 72 hours. Hgb A1c No results for input(s): "HGBA1C" in the last 72 hours. Lipid Profile No results for input(s): "CHOL", "HDL", "LDLCALC", "TRIG", "CHOLHDL", "LDLDIRECT" in the last 72 hours. Thyroid function studies No results for input(s): "TSH", "T4TOTAL", "T3FREE", "THYROIDAB" in the last 72 hours.  Invalid input(s): "FREET3" Anemia work up No results for input(s): "VITAMINB12", "FOLATE", "FERRITIN", "TIBC", "IRON", "RETICCTPCT" in the last 72 hours. Urinalysis    Component Value Date/Time   COLORURINE AMBER (A) 06/04/2022 1447   APPEARANCEUR CLOUDY (A) 06/04/2022 1447   LABSPEC 1.009 06/04/2022 1447   PHURINE 6.0 06/04/2022 1447   GLUCOSEU NEGATIVE 06/04/2022 1447   HGBUR MODERATE (A) 06/04/2022 1447   BILIRUBINUR NEGATIVE 06/04/2022 1447   KETONESUR NEGATIVE 06/04/2022 1447   PROTEINUR 100 (A) 06/04/2022 1447   NITRITE POSITIVE (A) 06/04/2022 1447   LEUKOCYTESUR MODERATE (A) 06/04/2022 1447   Sepsis Labs Recent Labs  Lab 06/04/22 1150 06/05/22 0525  WBC 5.5 5.5   Microbiology Recent Results (from the past 240 hour(s))  Culture, blood (routine x 2)     Status: None (Preliminary result)   Collection Time: 06/04/22 11:55 AM   Specimen: Right Antecubital; Blood  Result Value Ref Range Status   Specimen Description   Final    RIGHT ANTECUBITAL BOTTLES DRAWN AEROBIC AND ANAEROBIC   Special Requests Blood Culture adequate volume  Final   Culture   Final    NO GROWTH 3 DAYS Performed at [facility], [address], [place], [place] [address]    Report Status PENDING  Incomplete  Culture, blood (routine x 2)     Status: None (Preliminary result)   Collection Time: 06/04/22 12:19 PM   Specimen: BLOOD LEFT ARM  Result Value Ref Range Status   Specimen  Description BLOOD LEFT ARM BOTTLES DRAWN AEROBIC AND ANAEROBIC  Final   Special Requests Blood Culture adequate volume  Final   Culture   Final    NO GROWTH 3 DAYS Performed at [facility], [address] [place] [address], [place], [place] [address]    Report Status PENDING  Incomplete  Resp panel by RT-PCR (RSV, Flu A&B, Covid) Anterior Nasal Swab     Status: None   Collection Time: 06/04/22  2:32 PM   Specimen: Anterior Nasal Swab  Result Value Ref Range Status   SARS Coronavirus 2 by RT PCR NEGATIVE NEGATIVE Final    Comment: (NOTE) SARS-CoV-2 target nucleic acids are NOT DETECTED.  The SARS-CoV-2 RNA is generally detectable in upper respiratory specimens during the acute phase of infection. The lowest concentration of SARS-CoV-2 viral copies this assay can detect is 138 copies/mL. A negative result does not preclude SARS-Cov-2 infection and should not be used as the sole basis for treatment or other patient management decisions. A negative result may occur with  improper specimen collection/handling, submission of specimen other than nasopharyngeal swab, presence of viral mutation(s) within the areas targeted by this assay, and inadequate number of viral copies(<138 copies/mL). A negative result must be combined with clinical observations, patient history, and epidemiological information. The expected result is Negative.  Fact Sheet for Patients:  EntrepreneurPulse.com.au  Fact Sheet for Healthcare Providers:  IncredibleEmployment.be  This test is no t yet approved or cleared by the  [place] [employer]ural [employer] and  has been authorized for detection and/or diagnosis of SARS-CoV-2 by FDA under an Print production planner (EUA). This EUA will remain  in effect (meaning this test can be used) for the duration of the COVID-19 declaration under Section 564(b)(1) of the Act, 21 U.S.C.section 360bbb-3(b)(1), unless the authorization is terminated  or revoked sooner.        Influenza A by PCR NEGATIVE NEGATIVE Final   Influenza B by PCR NEGATIVE NEGATIVE Final    Comment: (NOTE) The Xpert Xpress SARS-CoV-2/FLU/RSV plus assay is intended as an aid in the diagnosis of influenza from Nasopharyngeal swab specimens and should not be used as a sole basis for treatment. Nasal washings and aspirates are unacceptable for Xpert Xpress SARS-CoV-2/FLU/RSV testing.  Fact Sheet for Patients: EntrepreneurPulse.com.au  Fact Sheet for Healthcare Providers: IncredibleEmployment.be  This test is not yet approved or cleared by the [place] FDA and has been authorized for detection and/or diagnosis of SARS-CoV-2 by FDA under an Emergency Use Authorization (EUA). This EUA will remain in effect (meaning this test can be used) for the duration of the COVID-19 declaration under Section 564(b)(1) of the Act, 21 U.S.C. section 360bbb-3(b)(1), unless the authorization is terminated or revoked.     Resp Syncytial Virus by PCR NEGATIVE NEGATIVE Final    Comment: (NOTE) Fact Sheet for Patients: EntrepreneurPulse.com.au  Fact Sheet for Healthcare Providers: IncredibleEmployment.be  This test is not yet approved or cleared by the [place] FDA and has been authorized for detection and/or diagnosis of SARS-CoV-2 by FDA under an Emergency Use Authorization (EUA). This EUA will remain in effect (meaning this test can be used) for the duration of the COVID-19 declaration under Section 564(b)(1) of the Act, 21 U.S.C. section 360bbb-3(b)(1), unless the authorization is terminated or revoked.  Performed at [facility], [address], [place], [place] [address]   Urine Culture     Status: Abnormal   Collection Time: 06/04/22  2:47 PM   Specimen: Urine, Random  Result Value Ref Range Status   Specimen Description   Final    URINE, RANDOM Performed at [facility], [address],  [place], [place] [address]    Special Requests   Final    NONE Reflexed from [id number] Performed at [facility], [address], [place],  [address]    Culture >=100,000 COLONIES/mL ESCHERICHIA COLI (A)  Final   Report Status 06/07/2022 FINAL  Final   Organism ID, Bacteria ESCHERICHIA COLI (A)  Final      Susceptibility   Escherichia coli - MIC*    AMPICILLIN <=2 SENSITIVE Sensitive     CEFAZOLIN <=4 SENSITIVE Sensitive     CEFEPIME <=0.12 SENSITIVE Sensitive     CEFTRIAXONE <=0.25 SENSITIVE Sensitive     CIPROFLOXACIN <=0.25 SENSITIVE Sensitive     GENTAMICIN <=1 SENSITIVE Sensitive     IMIPENEM <=0.25 SENSITIVE Sensitive     NITROFURANTOIN <=16 SENSITIVE Sensitive     TRIMETH/SULFA <=20 SENSITIVE Sensitive     AMPICILLIN/SULBACTAM <=2 SENSITIVE Sensitive     PIP/TAZO <=4 SENSITIVE Sensitive     * >=100,000 COLONIES/mL ESCHERICHIA COLI   Time coordinating discharge:  38 mins   SIGNED:  Irwin Brakeman, MD  [facility] 06/07/2022, 11:05 AM How to contact the [facility] Attending or Consulting provider [place] or covering provider during after hours [place], for this patient?  Check the care team in [facility] and look for a) attending/consulting [facility] provider listed and b) the [facility] team  listed Log into [email or web address] and use 's universal password to access. If you do not have the password, please contact the hospital operator. Locate the [facility] provider you are looking for under [facility] and page to a number that you can be directly reached. If you still have difficulty reaching the provider, please page the [facility] (Director on Call) for the Hospitalists listed on amion for assistance.

## 2022-06-07 NOTE — Care Management Important Message (Signed)
Important Message  Patient Details  Name: Tanya Wright MRN: [id number] Date of Birth: Feb 15, 1966   Medicare Important Message Given:  Yes (spoke with [name] at [phone] to review letter, no copy needed)     Tommy Medal 06/07/2022, 11:46 AM

## 2022-06-10 LAB — CULTURE, BLOOD (ROUTINE X 2)
Culture: NO GROWTH
Culture: NO GROWTH
Special Requests: ADEQUATE
Special Requests: ADEQUATE

## 2022-09-21 IMAGING — DX DG CHEST 1V PORT
1 series · 1 of 1 positions shown · non-contrast
Comparison: Chest radiographs 04/16/2017.

CLINICAL DATA: 55-year-old female with chest pain on the right.
Fever, chills, headache. History of cervical cancer.

EXAM:
PORTABLE CHEST 1 VIEW

[chest ap]
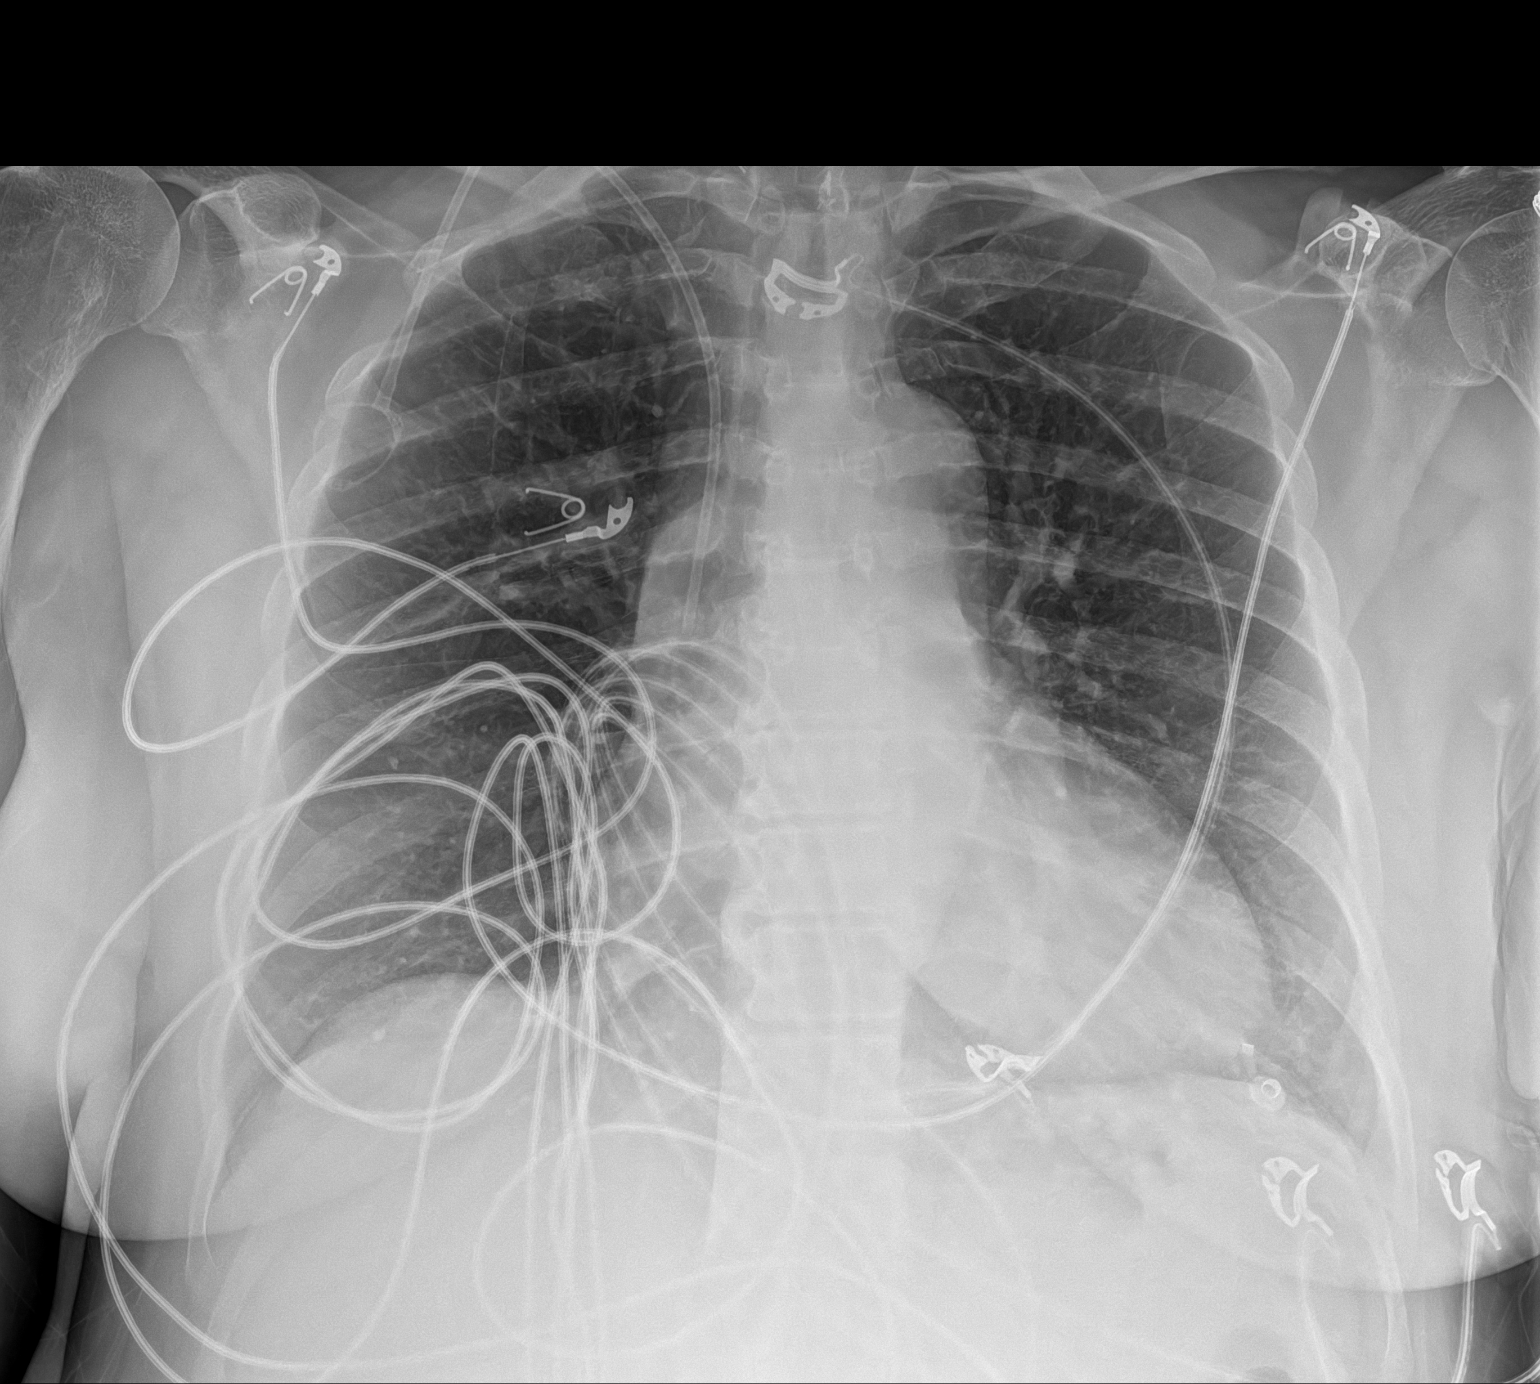

[1 of 1 positions shown; findings below may reference images not displayed]

FINDINGS: Portable AP upright view at 1114 hours. Stable right chest
Port-A-Cath. Allowing for portable technique lung volumes and
mediastinal contours remain within normal limits, lungs are clear.
No pneumothorax.

Interval thyroidectomy, with resolved rightward shift of the trachea
at the thoracic inlet. No acute osseous abnormality identified.
Paucity of bowel gas in the upper abdomen.
IMPRESSION: No acute cardiopulmonary abnormality.

## 2022-09-24 IMAGING — CT CT RENAL STONE PROTOCOL
2 of 4 series · 16 of 46 positions shown, 18 images · non-contrast
Comparison: 09/23/2020

CLINICAL DATA: Hydronephrosis, history of cervical cancer status
post radiation therapy, bilateral distal ureteral obstruction
managed bilateral ureteral stents, acute kidney injury and
hydronephrosis

EXAM:
CT ABDOMEN AND PELVIS WITHOUT CONTRAST
TECHNIQUE: Multidetector CT imaging of the abdomen and pelvis was performed
following the standard protocol without IV contrast.

[Series 2: axial st · axial · 0.72mm/px · z∈[-434,-69]mm · 13 of 83 slices shown, 15 images]
[im 5/83  soft-tissue]
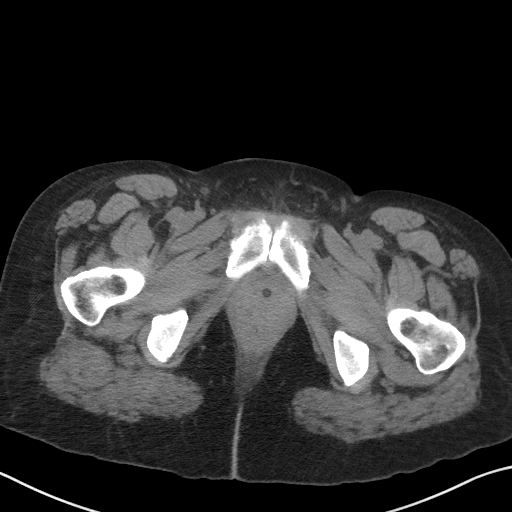
[im 5/83  bone]
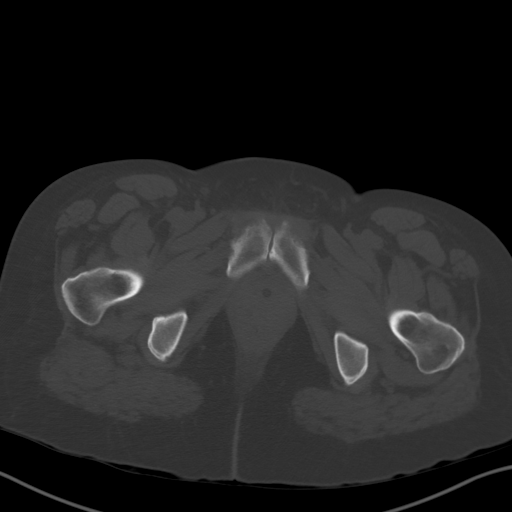
[im 10/83  soft-tissue]
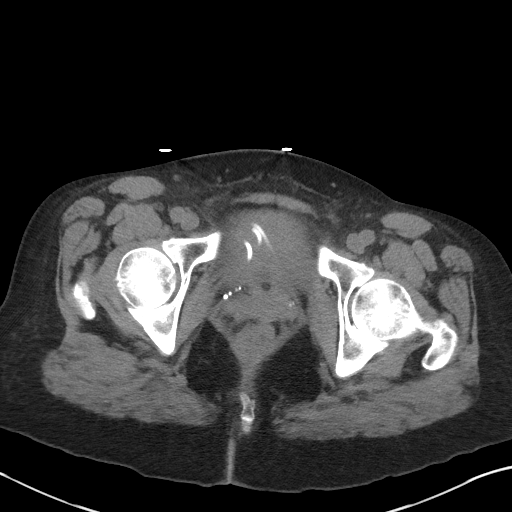
[im 20/83  soft-tissue]
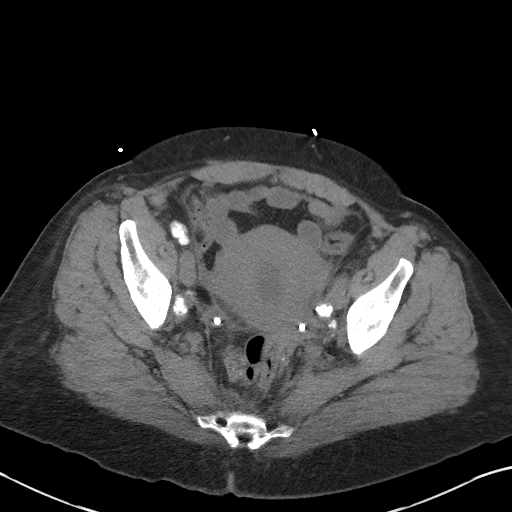
[im 25/83  soft-tissue]
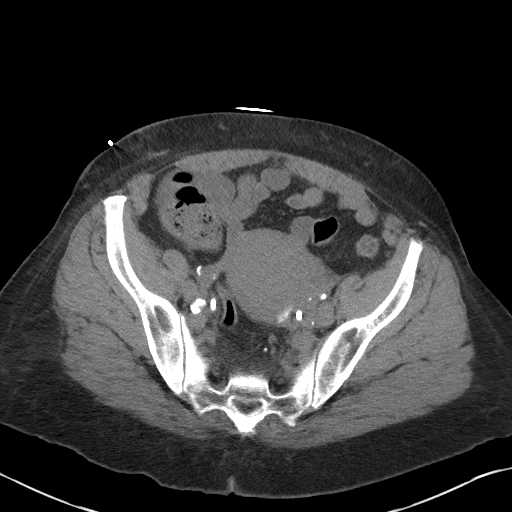
[im 29/83  soft-tissue]
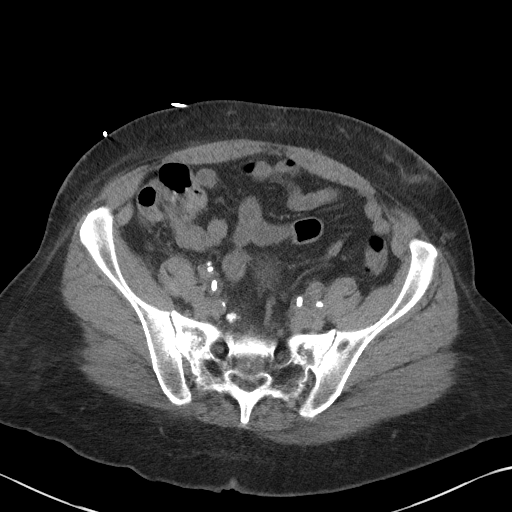
[im 34/83  soft-tissue]
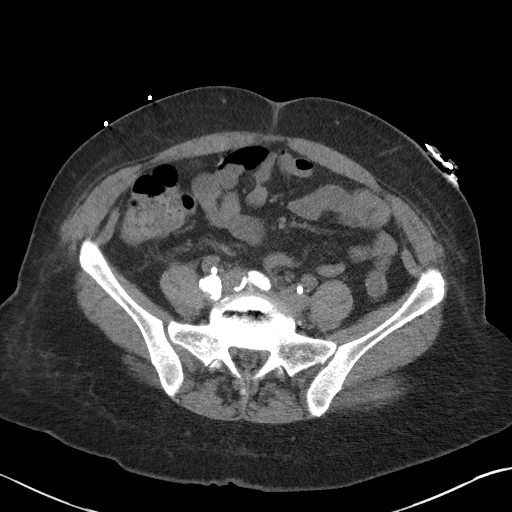
[im 44/83  soft-tissue]
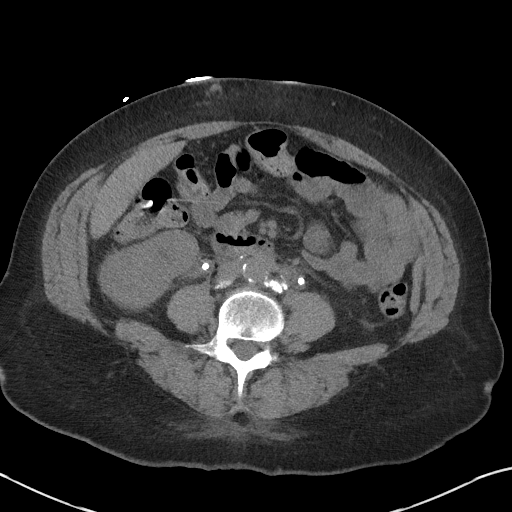
[im 49/83  soft-tissue]
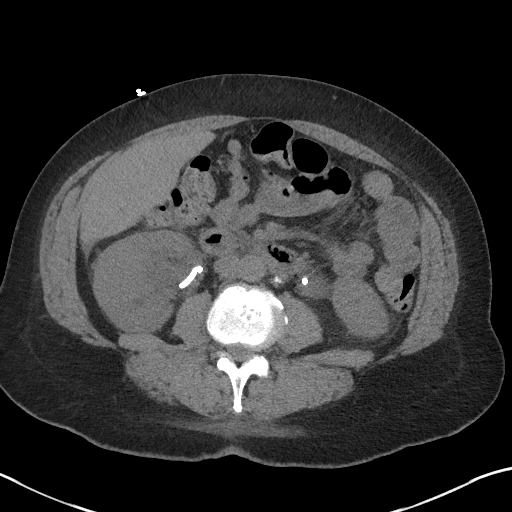
[im 54/83  soft-tissue]
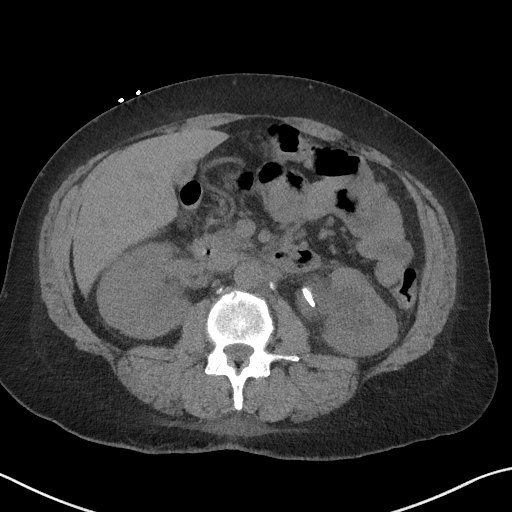
[im 54/83  bone]
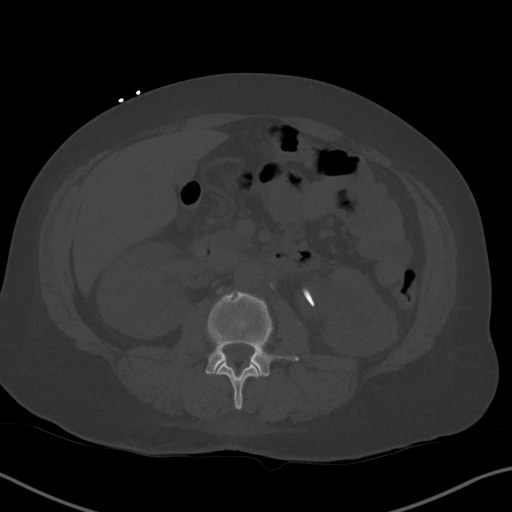
[im 58/83  soft-tissue]
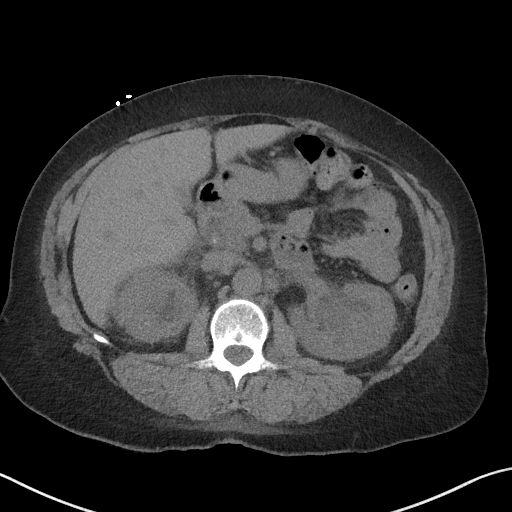
[im 63/83  soft-tissue]
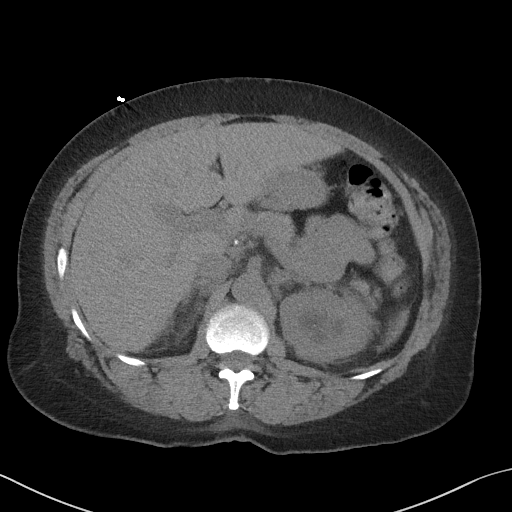
[im 73/83  soft-tissue]
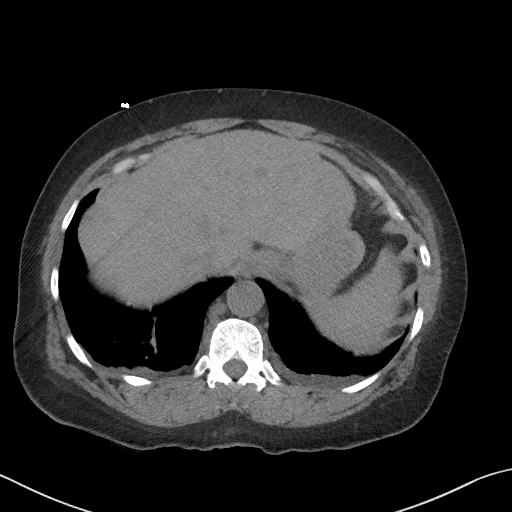
[im 78/83  soft-tissue]
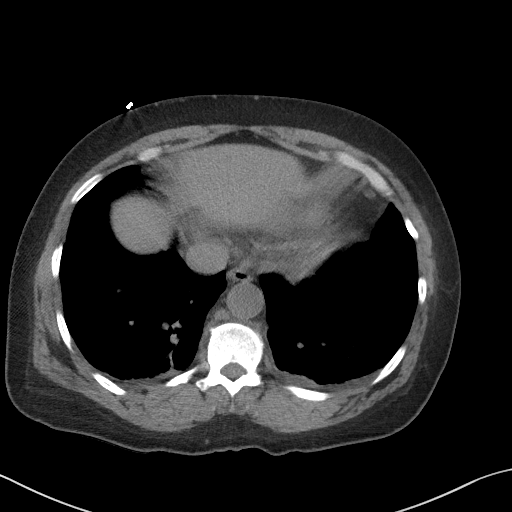

[Series 5: coronal st · coronal · 0.70mm/px · 3 of 91 slices shown]
[im 31/91  soft-tissue]
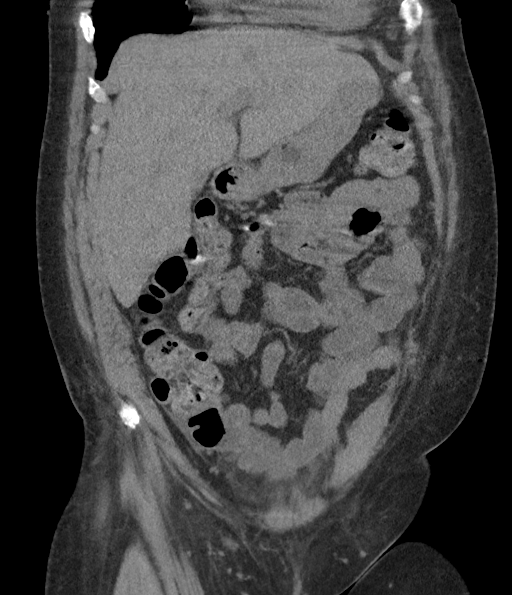
[im 41/91  soft-tissue]
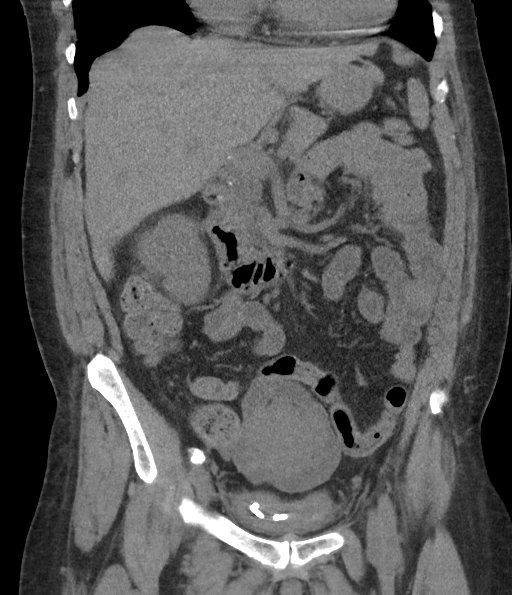
[im 51/91  soft-tissue]
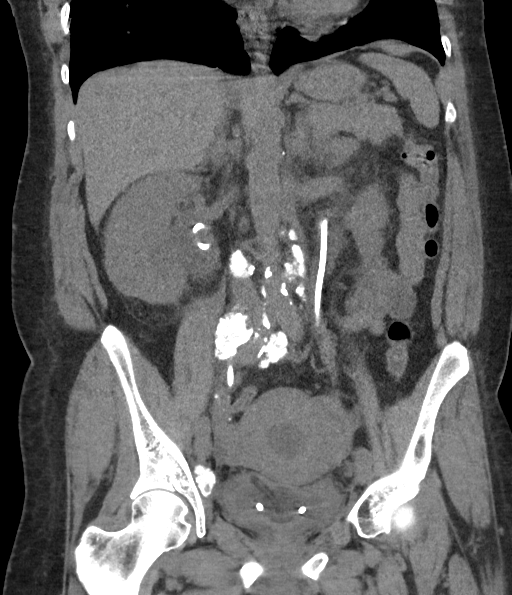

[16 of 46 positions shown; findings below may reference images not displayed]

FINDINGS: Lower chest: No acute abnormality.

Hepatobiliary: No solid liver abnormality is seen. No gallstones,
gallbladder wall thickening, or biliary dilatation.

Pancreas: Unremarkable. No pancreatic ductal dilatation or
surrounding inflammatory changes.

Spleen: Normal in size without significant abnormality.

Adrenals/Urinary Tract: Adrenal glands are unremarkable. Bilateral
double-J ureteral stents are positioned with formed pigtails in the
renal pelves and urinary bladder. Foley catheter 5. There is
moderate bilateral hydronephrosis, not significantly changed
compared to prior CT. Bladder is unremarkable.

Stomach/Bowel: Stomach is within normal limits. Appendix appears
normal. No evidence of bowel wall thickening, distention, or
inflammatory changes.

Vascular/Lymphatic: Aortic atherosclerosis. There are numerous,
enlarged, calcified retroperitoneal, bilateral iliac, and bilateral
pelvic sidewall lymph nodes.

Reproductive: Fluid-filled endometrial cavity.

Other: No abdominal wall hernia or abnormality. No abdominopelvic
ascites.

Musculoskeletal: No acute or significant osseous findings.
IMPRESSION: 1. Bilateral double-J ureteral stents are positioned with formed
pigtails in the renal pelves and urinary bladder. There is moderate
bilateral hydronephrosis, not significantly changed compared to
prior CT.
2. There are numerous, enlarged, calcified retroperitoneal,
bilateral iliac, and bilateral pelvic sidewall lymph nodes,
consistent with treated nodal metastatic disease.
3. Fluid-filled endometrial cavity, likely related to endocervical
obstruction in the setting of known malignancy.

Aortic Atherosclerosis ([id number]).

## 2023-03-08 ENCOUNTER — Emergency Department (HOSPITAL_COMMUNITY): Payer: 59

## 2023-03-08 ENCOUNTER — Emergency Department (HOSPITAL_COMMUNITY)
Admission: EM | Admit: 2023-03-08 | Discharge: 2023-03-08 | Discharge status: Against Medical Advice | Payer: 59 | Attending: Emergency Medicine | Admitting: Emergency Medicine

## 2023-03-08 ENCOUNTER — Encounter (HOSPITAL_COMMUNITY): Payer: Self-pay | Admitting: *Deleted

## 2023-03-08 ENCOUNTER — Other Ambulatory Visit: Payer: Self-pay

## 2023-03-08 DIAGNOSIS — R319 Hematuria, unspecified: Secondary | ICD-10-CM | POA: Insufficient documentation

## 2023-03-08 DIAGNOSIS — M25562 Pain in left knee: Secondary | ICD-10-CM | POA: Insufficient documentation

## 2023-03-08 DIAGNOSIS — M25561 Pain in right knee: Secondary | ICD-10-CM | POA: Diagnosis not present

## 2023-03-08 DIAGNOSIS — Z8541 Personal history of malignant neoplasm of cervix uteri: Secondary | ICD-10-CM | POA: Diagnosis not present

## 2023-03-08 DIAGNOSIS — Z5329 Procedure and treatment not carried out because of patient's decision for other reasons: Secondary | ICD-10-CM | POA: Diagnosis not present

## 2023-03-08 DIAGNOSIS — R109 Unspecified abdominal pain: Secondary | ICD-10-CM | POA: Diagnosis present

## 2023-03-08 DIAGNOSIS — N390 Urinary tract infection, site not specified: Secondary | ICD-10-CM | POA: Diagnosis not present

## 2023-03-08 LAB — CBC WITH DIFFERENTIAL/PLATELET
Abs Immature Granulocytes: 0.04 10*3/uL (ref 0.00–0.07)
Basophils Absolute: 0 10*3/uL (ref 0.0–0.1)
Basophils Relative: 0 %
Eosinophils Absolute: 0.1 10*3/uL (ref 0.0–0.5)
Eosinophils Relative: 2 %
HCT: 32.4 % — ABNORMAL LOW (ref 36.0–46.0)
Hemoglobin: 10.3 g/dL — ABNORMAL LOW (ref 12.0–15.0)
Immature Granulocytes: 1 %
Lymphocytes Relative: 21 %
Lymphs Abs: 1.5 10*3/uL (ref 0.7–4.0)
MCH: 29.6 pg (ref 26.0–34.0)
MCHC: 31.8 g/dL (ref 30.0–36.0)
MCV: 93.1 fL (ref 80.0–100.0)
Monocytes Absolute: 0.5 10*3/uL (ref 0.1–1.0)
Monocytes Relative: 7 %
Neutro Abs: 5 10*3/uL (ref 1.7–7.7)
Neutrophils Relative %: 69 %
Platelets: 320 10*3/uL (ref 150–400)
RBC: 3.48 MIL/uL — ABNORMAL LOW (ref 3.87–5.11)
RDW: 14 % (ref 11.5–15.5)
WBC: 7.2 10*3/uL (ref 4.0–10.5)
nRBC: 0 % (ref 0.0–0.2)

## 2023-03-08 LAB — URINALYSIS, ROUTINE W REFLEX MICROSCOPIC
Bilirubin Urine: NEGATIVE
Glucose, UA: NEGATIVE mg/dL
Ketones, ur: NEGATIVE mg/dL
Nitrite: NEGATIVE
Protein, ur: 30 mg/dL — AB
Specific Gravity, Urine: 1.011 (ref 1.005–1.030)
WBC, UA: >50 WBC/hpf (ref 0–5)
pH: 7 (ref 5.0–8.0)

## 2023-03-08 LAB — BASIC METABOLIC PANEL
Anion gap: 8 (ref 5–15)
BUN: 32 mg/dL — ABNORMAL HIGH (ref 6–20)
CO2: 26 mmol/L (ref 22–32)
Calcium: 8.7 mg/dL — ABNORMAL LOW (ref 8.9–10.3)
Chloride: 101 mmol/L (ref 98–111)
Creatinine, Ser: 2.22 mg/dL — ABNORMAL HIGH (ref 0.44–1.00)
GFR, Estimated: 25 mL/min — ABNORMAL LOW (ref >60)
Glucose, Bld: 96 mg/dL (ref 70–99)
Potassium: 4.9 mmol/L (ref 3.5–5.1)
Sodium: 135 mmol/L (ref 135–145)

## 2023-03-08 LAB — D-DIMER, QUANTITATIVE: D-Dimer, Quant: 3.27 ug{FEU}/mL — ABNORMAL HIGH (ref 0.00–0.50)

## 2023-03-08 MED ORDER — KETOROLAC TROMETHAMINE 15 MG/ML IJ SOLN
15.0000 mg | Freq: Once | INTRAMUSCULAR | Status: AC
  Administered 2023-03-08: 15 mg via INTRAMUSCULAR
  Filled 2023-03-08: qty 1

## 2023-03-08 MED ORDER — FENTANYL CITRATE PF 50 MCG/ML IJ SOSY
50.0000 ug | PREFILLED_SYRINGE | Freq: Once | INTRAMUSCULAR | Status: AC
  Administered 2023-03-08: 50 ug via INTRAVENOUS
  Filled 2023-03-08: qty 1

## 2023-03-08 MED ORDER — CEFPODOXIME PROXETIL 200 MG PO TABS: 200.0000 mg | ORAL_TABLET | Freq: Two times a day (BID) | ORAL | 0 refills | Status: AC

## 2023-03-08 MED ORDER — DICLOFENAC SODIUM 1 % EX GEL: 2.0000 g | Freq: Four times a day (QID) | CUTANEOUS | 0 refills | Status: AC

## 2023-03-08 MED ORDER — TECHNETIUM TO 99M ALBUMIN AGGREGATED
4.2000 | Freq: Once | INTRAVENOUS | Status: AC | PRN
  Administered 2023-03-08: 4.2 via INTRAVENOUS

## 2023-03-08 NOTE — Discharge Instructions (Signed)
You are seen in the ER today for back pain as well as.  Guarding your knees you need to follow-up with orthopedics.  There is no sign on your exam or blood work of knee infection I am not concerned about this at this time but you given pain medicine in the ER for this.  You are also given pain medicine for your left mid back pain.  Aware about possible infection in your bladder or kidney.  Given your ureteral stents it is important that we make sure that this is evaluated because you could have an infected stent and this can lead to sepsis which can cause organ failure or even death.  You are choosing to leave AGAINST MEDICAL ADVICE.  Our plan was to talk to urology for possible admission and stent exchange.  Since you are not willing to stay for this we are going to prescribe you antibiotics but you need to call your urologist tomorrow and come back to the ER for new or worsening symptoms.

## 2023-03-08 NOTE — ED Notes (Signed)
Pt very upset w/ time it has taken to get results & treatment. Pt requesting to leave. Pt encouraged to stay for urology consult however pt reports this hospital has done nothing to help her reason for coming in today. IV removed & pt given paperwork w/ antibiotic prescription. Educated on med admin. PA aware.

## 2023-03-08 NOTE — ED Provider Notes (Signed)
[place] EMERGENCY DEPARTMENT AT [facility] Provider Note   CSN: [id number] Arrival date & time: 03/08/23  1216     History  Chief Complaint  Patient presents with   Knee Pain    Tanya Wright is a 57 y.o. female.  She has PMH of stage IV cervical cancer currently on chemotherapy.  Presents to ER for bilateral knee pain that started 3 to 4 days ago without injury.  Also having swelling.  She denies fever or chills.  Not have any calf swelling or pain.  Denies numbness tingling or weakness.  She never had this type of pain in the past.  She reports that it is very painful to walk.  Over-the-counter medication has not been helpful.  No history of arthritis or autoimmune disorders.  Also having left flank pain.  Is in the mid back, is worse with taking a deep breath.   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  Plan for stent exchange 5 months from last exchange.  Knee Pain      Home Medications Prior to Admission medications   Medication Sig Start Date End Date Taking? Authorizing Provider  acetaminophen (TYLENOL) 325 MG tablet Take 2 tablets (650 mg total) by mouth every 6 (six) hours as needed for mild pain (or Fever >/= 101). 10/04/20  Yes Vassie Loll, MD  calcium acetate (PHOSLO) 667 MG capsule Take 2 capsules (1,334 mg total) by mouth 3 (three) times daily with meals. 10/22/20  Yes Sharee Holster, NP  cefpodoxime (VANTIN) 200 MG tablet Take 1 tablet (200 mg total) by mouth 2 (two) times daily for 7 days. 03/08/23 03/15/23 Yes Tiffinie Caillier A, PA-C  diclofenac Sodium (VOLTAREN) 1 % GEL Apply 2 g topically 4 (four) times daily. 03/08/23  Yes Aztlan Coll A, PA-C  Levothyroxine Sodium 150 MCG CAPS Take 1 tablet by mouth daily. 07/13/22  Yes [provider]  lidocaine (LIDODERM) 5 % PLACE TWO PATCHES ON THE SKIN DAILY. APPLY TO AFFECTED AREA FOR 12  HOURS ONLY EACH DAY. (THEN REMOVE PATCH)   Yes [provider]  lidocaine-prilocaine (EMLA) cream Apply topically. 10/29/20  Yes [provider]  oxyCODONE (OXY IR/ROXICODONE) 5 MG immediate release tablet Take 5 mg by mouth 3 (three) times daily. 02/10/23  Yes [provider]  venlafaxine XR (EFFEXOR-XR) 75 MG 24 hr capsule Take 75 mg by mouth daily with breakfast.   Yes [provider]      Allergies    Oxycodone    Review of Systems   Review of Systems  Physical Exam Updated Vital Signs BP (!) 125/94   Pulse 76   Temp 99.5 F (37.5 C) (Oral)   Resp 15   Ht 5\' 11"  (1.803 m)   Wt 85.7 kg   LMP 02/28/2017   SpO2 98%   BMI 26.36 kg/m  Physical Exam Vitals and nursing note reviewed.  Constitutional:      General: She is not in acute distress.    Appearance: She is well-developed.  HENT:     Head: Normocephalic and atraumatic.  Eyes:     Conjunctiva/sclera: Conjunctivae normal.     Pupils: Pupils are equal, round, and reactive to light.  Cardiovascular:     Rate and Rhythm: Normal rate and regular rhythm.     Heart sounds: No murmur heard. Pulmonary:     Effort: Pulmonary effort is normal. No  respiratory distress.     Breath sounds: Normal breath sounds.  Abdominal:     Palpations: Abdomen is soft.     Tenderness: There is no abdominal tenderness. There is left CVA tenderness.  Musculoskeletal:        General: Swelling present.     Cervical back: Neck supple.     Comments: Mild swelling bilateral knees, no calf swelling or tenderness to bilateral lower extremities, no thigh pain or swelling.  No popliteal tenderness.  Pain is primarily anterior knees.  Patient can been to about 80 degrees bilaterally actively but limited due to pain beyond that.  Able to ambulate but is having pain.  No overlying erythema or warmth to the joints.  No crepitus.  No signs of trauma.  Distal pulses intact.  Skin:    General: Skin is warm and dry.      Capillary Refill: Capillary refill takes less than 2 seconds.  Neurological:     General: No focal deficit present.     Mental Status: She is alert and oriented to person, place, and time.  Psychiatric:        Mood and Affect: Mood normal.     ED Results / Procedures / Treatments   Labs (all labs ordered are listed, but only abnormal results are displayed) Labs Reviewed  BASIC METABOLIC PANEL - Abnormal; Notable for the following components:      Result Value   BUN 32 (*)    Creatinine, Ser 2.22 (*)    Calcium 8.7 (*)    GFR, Estimated 25 (*)    All other components within normal limits  CBC WITH DIFFERENTIAL/PLATELET - Abnormal; Notable for the following components:   RBC 3.48 (*)    Hemoglobin 10.3 (*)    HCT 32.4 (*)    All other components within normal limits  D-DIMER, QUANTITATIVE - Abnormal; Notable for the following components:   D-Dimer, Quant 3.27 (*)    All other components within normal limits  URINALYSIS, ROUTINE W REFLEX MICROSCOPIC - Abnormal; Notable for the following components:   APPearance HAZY (*)    Hgb urine dipstick SMALL (*)    Protein, ur 30 (*)    Leukocytes,Ua LARGE (*)    Bacteria, UA RARE (*)    All other components within normal limits  URINE CULTURE    EKG None  Radiology NM Pulmonary Perfusion  Result Date: 03/08/2023 CLINICAL DATA:  Bilateral leg pain and back pain with deep breaths. EXAM: NUCLEAR MEDICINE PERFUSION LUNG SCAN TECHNIQUE: Perfusion images were obtained in multiple projections after intravenous injection of radiopharmaceutical. Ventilation scans intentionally deferred if perfusion scan and chest x-ray adequate for interpretation during COVID 19 epidemic. RADIOPHARMACEUTICALS:  4.2 mCi Tc-1m MAA IV COMPARISON:  None Available. FINDINGS: Normal homogeneous distribution of tracer activity is noted throughout both lungs. No segmental or subsegmental perfusion defects are identified. IMPRESSION: Normal nuclear medicine perfusion  lung scan. Electronically Signed   By: Aram Candela M.D.   On: 03/08/2023 20:33   DG Chest Portable 1 View  Result Date: 03/08/2023 CLINICAL DATA:  pleuritic pain EXAM: PORTABLE CHEST 1 VIEW COMPARISON:  Same day CT chest, abdomen, and pelvis at 3:40 p.m. Chest radiograph dated June 04, 2022. FINDINGS: The heart size and mediastinal contours are within normal limits. Right chest Port-A-Cath tip projects over the mid SVC. Bibasilar scarring/atelectasis. No focal consolidation. No pneumothorax or pleural effusion. No acute osseous abnormality. IMPRESSION: No acute cardiopulmonary findings. Electronically Signed   By: Hart Robinsons  M.D.   On: 03/08/2023 20:04   CT CHEST ABDOMEN PELVIS WO CONTRAST  Result Date: 03/08/2023 CLINICAL DATA:  Shortness of breath and swelling in both legs. History of cervical cancer. Prior chemotherapy. * Tracking Code: BO * EXAM: CT CHEST, ABDOMEN AND PELVIS WITHOUT CONTRAST TECHNIQUE: Multidetector CT imaging of the chest, abdomen and pelvis was performed following the standard protocol without IV contrast. RADIATION DOSE REDUCTION: This exam was performed according to the departmental dose-optimization program which includes automated exposure control, adjustment of the mA and/or kV according to patient size and/or use of iterative reconstruction technique. COMPARISON:  CT stone study and chest radiograph of 06/04/2012. No prior chest CTs. FINDINGS: CT CHEST FINDINGS Cardiovascular: Right Port-A-Cath tip low SVC. Normal heart size, without pericardial effusion. Three vessel coronary artery calcification. Mediastinum/Nodes: Thyroidectomy. No supraclavicular adenopathy. No mediastinal or hilar adenopathy, given limitations of unenhanced CT. Lungs/Pleura: No pleural fluid. Extensive bibasilar scarring. No suspicious pulmonary nodule or mass. Musculoskeletal: Included within the abdomen pelvic section. CT ABDOMEN PELVIS FINDINGS Hepatobiliary: Hepatomegaly at 18.6 cm.  Normal noncontrast appearance of the gallbladder, biliary tract. Pancreas: Normal, without mass or ductal dilatation. Spleen: Normal in size, without focal abnormality. Adrenals/Urinary Tract: Normal adrenal glands. Mild to moderate bilateral renal cortical thinning. Bilateral ureteric stents appropriately positioned. No renal, ureteric, or bladder calculi. Moderate left and mild right-sided caliectasis are unchanged. Nonspecific mild perinephric interstitial thickening is also unchanged. Mild bladder wall thickening with pericystic edema is chronic. Stomach/Bowel: Normal stomach, without wall thickening. Colonic stool burden suggests constipation. Normal terminal ileum. Normal small bowel. Vascular/Lymphatic: Advanced aortic and branch vessel atherosclerosis. Densely calcified abdominopelvic adenopathy again identified. Example calcified node anterior to the lower IVC at 1.2 cm on 85/2. Right external iliac calcified node of 1.1 cm on 107/2, similar. Reproductive: Normal uterus Other: No significant free fluid.  No free intraperitoneal air. Musculoskeletal: Lumbosacral spondylosis. IMPRESSION: 1.  No acute process in the chest. 2. Bilateral ureteric stents in place with similar left-greater-than-right hydronephrosis. Nonspecific bilateral perinephric and pericystic edema. The pericystic edema could be radiation induced or represent chronic cystitis. The perinephric interstitial thickening could represent chronic pyelonephritis. 3. Similar calcified abdominopelvic adenopathy, likely treated metastasis. 4.  Possible constipation. 5. Age advanced coronary artery atherosclerosis. Recommend assessment of coronary risk factors. 6.  Aortic Atherosclerosis (ICD10-I70.0). Electronically Signed   By: Jeronimo Greaves M.D.   On: 03/08/2023 17:23    Procedures Procedures    Medications Ordered in ED Medications  ketorolac (TORADOL) 15 MG/ML injection 15 mg (15 mg Intramuscular Given 03/08/23 1428)  fentaNYL (SUBLIMAZE)  injection 50 mcg (50 mcg Intravenous Given 03/08/23 1512)  technetium albumin aggregated (MAA) injection solution 4.2 millicurie (4.2 millicuries Intravenous Contrast Given 03/08/23 1710)  fentaNYL (SUBLIMAZE) injection 50 mcg (50 mcg Intravenous Given 03/08/23 2003)    ED Course/ Medical Decision Making/ A&P Clinical Course as of 03/08/23 2321  Thu Mar 08, 2023  1656 Patient with history of cervical cancer presents with bilateral knee pain for several days and also having left flank pain with pleuritic pain as well.  Given her chronic medical problems this is a broad differential including PE, pneumonia, pleurisy, pyelonephritis, ureterolithiasis, muscle strain.  The patient reports no hemoptysis or calf swelling, but history of cervical cancer so cannot rule out PE, unfortunately patient has CKD so will obtain V/Q.  She does also have a UTI, CT ordered to rule out stone versus Pyelo though she denies urinary symptoms. [CB]    Clinical Course User Index [CB] Carmel Sacramento  A, PA-C                                 Medical Decision Making This patient presents to the ED for concern of left flank pain bilateral knee pain, this involves an extensive number of treatment options, and is a complaint that carries with it a high risk of complications and morbidity.  The differential diagnosis includes osteoarthritis, rheumatoid arthritis, septic arthritis, contusion, PE, pneumothorax, pneumonia, UTI, pyelonephritis, ureterolithiasis other   Co morbidities that complicate the patient evaluation :   Cervical cancer   Additional history obtained:  Additional history obtained from EMR External records from outside source obtained and reviewed including notes      Problem List / ED Course / Critical interventions / Medication management  Patient here with multiple complaints, complaining of bilateral knee pain and swelling for 4 days.  No trauma or injury, no fevers, mild swelling only on exam.   She is able to flex and extend her knees.  There is no overlying redness or warmth.  Do not suspect septic arthritis especially bilaterally.  No calf pain or swelling to suggest VTE in her calves.  I was concerned about her left flank pain that sounded like possibly kidney stone or infection but patient also complaining of pain with deep breath and she has history of cancer so I was worried about possible PE, I ultimately got a VQ scan due to her renal dysfunction at baseline and this was negative.  CT chest abdomen pelvis without contrast had multiple findings with some fluid around kidneys as well as bladder which could be changes from radiation versus chronic infection.  Given her bilateral stents and UTI on urinalysis I am concerned about a possible infected stent.  Discussed with the patient, I sent her urine for culture, I consulted with urology but before I was able to speak with them patient insisted on leaving.  She been informed of risks of sepsis, organ failure or death, she is not willing to stay and wants to go home.  She is requesting that I aspirate her knees for pain control.  Discussed with her I would need her to follow-up with orthopedics, do not feel she needs aspiration for diagnostic purposes emergently.  Shortly after that she told the nurse she was leaving.  I prescribed antibiotics and she was informed she needed to finish these and follow-up closely with urology and informed to come back to the ER for new or worsening symptoms.  Patient demonstrates decision-making capacity, she is not confused, she is alert and oriented x 4.  She is leaving AGAINST MEDICAL ADVICE I ordered medication including fentanyl  for pain  Reevaluation of the patient after these medicines showed that the patient improved I have reviewed the patients home medicines and have made adjustments as needed      Test / Admission - Considered:  Plan was to consult with urology and discussed admission with patient  for possible stent exchange for infected ureteral stents.  Patient refusing to stay, leaving AGAINST MEDICAL ADVICE    Amount and/or Complexity of Data Reviewed Labs: ordered. Decision-making details documented in ED Course. Radiology: ordered. Decision-making details documented in ED Course.  Risk Prescription drug management.           Final Clinical Impression(s) / ED Diagnoses Final diagnoses:  Urinary tract infection with hematuria, site unspecified  Acute pain of both knees  Rx / DC Orders ED Discharge Orders          Ordered    cefpodoxime (VANTIN) 200 MG tablet  2 times daily        03/08/23 2157    diclofenac Sodium (VOLTAREN) 1 % GEL  4 times daily        03/08/23 2201              Josem Kaufmann 03/08/23 2321    Bethann Berkshire, MD 03/09/23 1739

## 2023-03-08 NOTE — ED Notes (Signed)
See triage notes. Mild swelling noted to right knee. No warmth noted and sensation equal on both sides. Nad.

## 2023-03-08 NOTE — ED Notes (Signed)
Report given to Azell Der RN

## 2023-03-08 NOTE — ED Notes (Signed)
Unable to obtain EKG at this time due to patient in uncontrolled pain/constant movement.  RN notified.

## 2023-03-08 NOTE — ED Triage Notes (Signed)
Pt with left knee pain and swelling x 3 days.  Also c/o left flank pain yesterday, denies any SOB.  Pt tearful in triage. Denies any injury or falls

## 2023-03-14 LAB — URINE CULTURE: Culture: 20000 — AB

## 2023-03-15 ENCOUNTER — Telehealth (HOSPITAL_BASED_OUTPATIENT_CLINIC_OR_DEPARTMENT_OTHER): Payer: Self-pay | Admitting: *Deleted

## 2023-03-15 NOTE — Telephone Encounter (Signed)
Post ED Visit - Positive Culture Follow-up  Culture report reviewed by antimicrobial stewardship pharmacist: [facility] [x]  Lennie Muckle, Pharm.D. []  Celedonio Miyamoto, Pharm.D., BCPS AQ-ID []  Garvin Fila, Pharm.D., BCPS []  Georgina Pillion, Pharm.D., BCPS []  [place], [address].D., BCPS, AAHIVP []  Estella Husk, Pharm.D., BCPS, AAHIVP []  Lysle Pearl, PharmD, BCPS []  Phillips Climes, PharmD, BCPS []  Agapito Games, PharmD, BCPS []  Verlan Friends, PharmD []  Mervyn Gay, PharmD, BCPS []  Vinnie Level, PharmD  [facility] []  Len Childs, PharmD []  Greer Pickerel, PharmD []  Adalberto Cole, PharmD []  Perlie Gold, Rph []  Lonell Face) Jean Rosenthal, PharmD []  Earl Many, PharmD []  Junita Push, PharmD []  Dorna Leitz, PharmD []  Terrilee Files, PharmD []  Lynann Beaver, PharmD []  Keturah Barre, PharmD []  Loralee Pacas, PharmD []  Bernadene Person, PharmD   Positive urine culture Treated with Cefpodoxime Proxetil, organism sensitive to the same and no further patient follow-up is required at this time.  Virl Axe [facility] 03/15/2023, 10:23 AM
